# Patient Record
Sex: Female | Born: 1955
Health system: Southern US, Community
[De-identification: ages and names within clinical notes are randomized; demographics above are authoritative.]

## PROBLEM LIST (undated history)

## (undated) DIAGNOSIS — E785 Hyperlipidemia, unspecified: Secondary | ICD-10-CM

## (undated) DIAGNOSIS — G4730 Sleep apnea, unspecified: Secondary | ICD-10-CM

## (undated) DIAGNOSIS — I10 Essential (primary) hypertension: Secondary | ICD-10-CM

## (undated) DIAGNOSIS — M5126 Other intervertebral disc displacement, lumbar region: Secondary | ICD-10-CM

## (undated) DIAGNOSIS — Z923 Personal history of irradiation: Secondary | ICD-10-CM

## (undated) DIAGNOSIS — R0602 Shortness of breath: Secondary | ICD-10-CM

## (undated) DIAGNOSIS — C50919 Malignant neoplasm of unspecified site of unspecified female breast: Secondary | ICD-10-CM

## (undated) DIAGNOSIS — M8580 Other specified disorders of bone density and structure, unspecified site: Secondary | ICD-10-CM

## (undated) DIAGNOSIS — F329 Major depressive disorder, single episode, unspecified: Secondary | ICD-10-CM

## (undated) DIAGNOSIS — F32A Depression, unspecified: Secondary | ICD-10-CM

## (undated) DIAGNOSIS — H269 Unspecified cataract: Secondary | ICD-10-CM

## (undated) DIAGNOSIS — F419 Anxiety disorder, unspecified: Secondary | ICD-10-CM

## (undated) DIAGNOSIS — B9681 Helicobacter pylori [H. pylori] as the cause of diseases classified elsewhere: Secondary | ICD-10-CM

## (undated) DIAGNOSIS — K449 Diaphragmatic hernia without obstruction or gangrene: Secondary | ICD-10-CM

## (undated) DIAGNOSIS — M722 Plantar fascial fibromatosis: Secondary | ICD-10-CM

## (undated) DIAGNOSIS — K222 Esophageal obstruction: Secondary | ICD-10-CM

## (undated) DIAGNOSIS — M1990 Unspecified osteoarthritis, unspecified site: Secondary | ICD-10-CM

## (undated) DIAGNOSIS — Z803 Family history of malignant neoplasm of breast: Secondary | ICD-10-CM

## (undated) DIAGNOSIS — G5600 Carpal tunnel syndrome, unspecified upper limb: Secondary | ICD-10-CM

## (undated) DIAGNOSIS — K219 Gastro-esophageal reflux disease without esophagitis: Secondary | ICD-10-CM

## (undated) DIAGNOSIS — K2970 Gastritis, unspecified, without bleeding: Secondary | ICD-10-CM

## (undated) DIAGNOSIS — R52 Pain, unspecified: Secondary | ICD-10-CM

## (undated) DIAGNOSIS — M7501 Adhesive capsulitis of right shoulder: Secondary | ICD-10-CM

## (undated) DIAGNOSIS — Z1239 Encounter for other screening for malignant neoplasm of breast: Secondary | ICD-10-CM

## (undated) DIAGNOSIS — M47816 Spondylosis without myelopathy or radiculopathy, lumbar region: Secondary | ICD-10-CM

## (undated) DIAGNOSIS — M79641 Pain in right hand: Secondary | ICD-10-CM

## (undated) DIAGNOSIS — M25552 Pain in left hip: Secondary | ICD-10-CM

## (undated) DIAGNOSIS — M1611 Unilateral primary osteoarthritis, right hip: Secondary | ICD-10-CM

## (undated) HISTORY — PX: OTHER SURGICAL HISTORY: SHX169

## (undated) HISTORY — PX: TONSILLECTOMY: SUR1361

## (undated) HISTORY — PX: BREAST LUMPECTOMY: SHX2

## (undated) HISTORY — DX: Family history of malignant neoplasm of breast: Z80.3

## (undated) HISTORY — PX: ESOPHAGECTOMY: SUR457

## (undated) HISTORY — DX: Unspecified cataract: H26.9

## (undated) HISTORY — DX: Malignant neoplasm of unspecified site of unspecified female breast: C50.919

## (undated) HISTORY — DX: Diaphragmatic hernia without obstruction or gangrene: K44.9

## (undated) HISTORY — DX: Esophageal obstruction: K22.2

## (undated) HISTORY — DX: Other specified disorders of bone density and structure, unspecified site: M85.80

## (undated) HISTORY — PX: CHOLECYSTECTOMY: SHX55

## (undated) HISTORY — DX: Helicobacter pylori (H. pylori) as the cause of diseases classified elsewhere: B96.81

## (undated) HISTORY — DX: Carpal tunnel syndrome, unspecified upper limb: G56.00

## (undated) HISTORY — PX: CARPAL TUNNEL RELEASE: SHX101

## (undated) HISTORY — DX: Hyperlipidemia, unspecified: E78.5

## (undated) HISTORY — PX: ABDOMINAL HYSTERECTOMY: SHX81

## (undated) HISTORY — DX: Helicobacter pylori (H. pylori) as the cause of diseases classified elsewhere: K29.70

## (undated) HISTORY — PX: UPPER GASTROINTESTINAL ENDOSCOPY: SHX188

---

## 2001-01-10 ENCOUNTER — Other Ambulatory Visit: Admission: RE | Admit: 2001-01-10 | Discharge: 2001-01-10 | Payer: Self-pay | Admitting: Family Medicine

## 2003-04-12 ENCOUNTER — Ambulatory Visit (HOSPITAL_COMMUNITY): Admission: RE | Admit: 2003-04-12 | Discharge: 2003-04-12 | Payer: Self-pay | Admitting: *Deleted

## 2003-04-17 ENCOUNTER — Ambulatory Visit (HOSPITAL_COMMUNITY): Admission: RE | Admit: 2003-04-17 | Discharge: 2003-04-17 | Payer: Self-pay | Admitting: Cardiology

## 2004-12-31 ENCOUNTER — Emergency Department (HOSPITAL_COMMUNITY): Admission: EM | Admit: 2004-12-31 | Discharge: 2005-01-01 | Payer: Self-pay | Admitting: Emergency Medicine

## 2006-11-02 ENCOUNTER — Ambulatory Visit (HOSPITAL_COMMUNITY): Admission: RE | Admit: 2006-11-02 | Discharge: 2006-11-02 | Payer: Self-pay | Admitting: Family Medicine

## 2007-10-09 ENCOUNTER — Ambulatory Visit (HOSPITAL_COMMUNITY): Admission: RE | Admit: 2007-10-09 | Discharge: 2007-10-09 | Payer: Self-pay | Admitting: Family Medicine

## 2011-06-22 ENCOUNTER — Other Ambulatory Visit: Payer: Self-pay | Admitting: Otolaryngology

## 2011-06-25 ENCOUNTER — Encounter (HOSPITAL_COMMUNITY): Payer: Self-pay | Admitting: Pharmacy Technician

## 2011-06-29 ENCOUNTER — Other Ambulatory Visit: Payer: Self-pay

## 2011-06-29 ENCOUNTER — Encounter (HOSPITAL_COMMUNITY)
Admission: RE | Admit: 2011-06-29 | Discharge: 2011-06-29 | Disposition: A | Discharge status: Home / Self Care | Payer: BC Managed Care – PPO | Source: Ambulatory Visit | Attending: Specialist | Admitting: Specialist

## 2011-06-29 ENCOUNTER — Ambulatory Visit (HOSPITAL_COMMUNITY)
Admission: RE | Admit: 2011-06-29 | Discharge: 2011-06-29 | Disposition: A | Discharge status: Home / Self Care | Payer: BC Managed Care – PPO | Source: Ambulatory Visit | Attending: Specialist | Admitting: Specialist

## 2011-06-29 ENCOUNTER — Encounter (HOSPITAL_COMMUNITY): Payer: Self-pay

## 2011-06-29 DIAGNOSIS — Z01810 Encounter for preprocedural cardiovascular examination: Secondary | ICD-10-CM | POA: Insufficient documentation

## 2011-06-29 DIAGNOSIS — I10 Essential (primary) hypertension: Secondary | ICD-10-CM | POA: Insufficient documentation

## 2011-06-29 DIAGNOSIS — S43429A Sprain of unspecified rotator cuff capsule, initial encounter: Secondary | ICD-10-CM | POA: Insufficient documentation

## 2011-06-29 DIAGNOSIS — Z01812 Encounter for preprocedural laboratory examination: Secondary | ICD-10-CM | POA: Insufficient documentation

## 2011-06-29 DIAGNOSIS — Z01818 Encounter for other preprocedural examination: Secondary | ICD-10-CM | POA: Insufficient documentation

## 2011-06-29 DIAGNOSIS — X58XXXA Exposure to other specified factors, initial encounter: Secondary | ICD-10-CM | POA: Insufficient documentation

## 2011-06-29 HISTORY — DX: Shortness of breath: R06.02

## 2011-06-29 HISTORY — DX: Gastro-esophageal reflux disease without esophagitis: K21.9

## 2011-06-29 HISTORY — DX: Essential (primary) hypertension: I10

## 2011-06-29 HISTORY — DX: Sleep apnea, unspecified: G47.30

## 2011-06-29 HISTORY — DX: Unspecified osteoarthritis, unspecified site: M19.90

## 2011-06-29 HISTORY — DX: Plantar fascial fibromatosis: M72.2

## 2011-06-29 HISTORY — DX: Major depressive disorder, single episode, unspecified: F32.9

## 2011-06-29 HISTORY — DX: Other intervertebral disc displacement, lumbar region: M51.26

## 2011-06-29 HISTORY — DX: Depression, unspecified: F32.A

## 2011-06-29 LAB — CBC
HCT: 34.7 % — ABNORMAL LOW (ref 36.0–46.0)
Hemoglobin: 11.7 g/dL — ABNORMAL LOW (ref 12.0–15.0)
MCH: 30.7 pg (ref 26.0–34.0)
MCHC: 33.7 g/dL (ref 30.0–36.0)
MCV: 91.1 fL (ref 78.0–100.0)
Platelets: 208 10*3/uL (ref 150–400)
RBC: 3.81 MIL/uL — ABNORMAL LOW (ref 3.87–5.11)
RDW: 13 % (ref 11.5–15.5)
WBC: 7 10*3/uL (ref 4.0–10.5)

## 2011-06-29 LAB — SURGICAL PCR SCREEN
MRSA, PCR: NEGATIVE
Staphylococcus aureus: POSITIVE — AB

## 2011-06-29 LAB — BASIC METABOLIC PANEL
BUN: 13 mg/dL (ref 6–23)
CO2: 27 mEq/L (ref 19–32)
Calcium: 9.8 mg/dL (ref 8.4–10.5)
Chloride: 101 mEq/L (ref 96–112)
Creatinine, Ser: 0.99 mg/dL (ref 0.50–1.10)
GFR calc Af Amer: 74 mL/min — ABNORMAL LOW (ref >90)
GFR calc non Af Amer: 63 mL/min — ABNORMAL LOW (ref >90)
Glucose, Bld: 116 mg/dL — ABNORMAL HIGH (ref 70–99)
Potassium: 4.1 mEq/L (ref 3.5–5.1)
Sodium: 137 mEq/L (ref 135–145)

## 2011-06-29 NOTE — Pre-Procedure Instructions (Signed)
EKG AND CHEST XRAY WERE DONE TODAY PREOP AT [facility] PER ANESTHESIOLOGIST'S GUIDELINES. PT'S SLEEP STUDY REPORT FROM SLEEP CENTER AT [facility] 06/21/10--IS ON THIS CHART.

## 2011-06-29 NOTE — Patient Instructions (Signed)
[address] Sadaf MILIA WARTH  06/29/2011   Your procedure is scheduled on:  Friday  2/1  AT 1:00PM  Report to [facility] at 11:00 AM.  Call this number if you have problems the morning of surgery: [phone]   Remember:DO NOT TAKE ANY DIABETIC MEDS AM OF SURGERY   Do not eat food AFTER MIDNIGHT THE NIGHT BEFORE YOUR SURGERY.  May have clear liquids FROM MIDNIGHT UNTIL 7:00 AM THE DAY OF YOUR SURGERY.   BUT NOTHING TO DRINK AFTER 7:OO AM  OF SURGERY.  Clear liquids include soda, tea, black coffee, apple or grape juice, WATER.  Take these medicines the morning of surgery with A SIP OF WATER: TAKE YOUR OVER THE COUNTER ANTACID PILL THE AM OF SURGERY.   Do not wear jewelry, make-up or nail polish.  Do not wear lotions, powders, or perfumes.   Do not shave 48 hours prior to surgery.  Do not bring valuables to the hospital.  Contacts, dentures or bridgework may not be worn into surgery.  Leave suitcase in the car. After surgery it may be brought to your room.  For patients admitted to the hospital, checkout time is 11:00 AM the day of discharge.   Patients discharged the day of surgery will not be allowed to drive home.    Special Instructions: CHG Shower Use Special Wash: 1/2 bottle night before surgery and 1/2 bottle morning of surgery.   Please read over the following fact sheets that you were given: MRSA Information AND INCENTIVE SPIROMETRY INSTRUCTIONS.

## 2011-07-09 ENCOUNTER — Encounter (HOSPITAL_COMMUNITY): Payer: Self-pay | Admitting: Anesthesiology

## 2011-07-09 ENCOUNTER — Ambulatory Visit (HOSPITAL_COMMUNITY)
Admission: RE | Admit: 2011-07-09 | Discharge: 2011-07-09 | Disposition: A | Discharge status: Home / Self Care | Payer: BC Managed Care – PPO | Source: Ambulatory Visit | Attending: Specialist | Admitting: Specialist

## 2011-07-09 ENCOUNTER — Ambulatory Visit (HOSPITAL_COMMUNITY): Payer: BC Managed Care – PPO | Admitting: Anesthesiology

## 2011-07-09 ENCOUNTER — Encounter (HOSPITAL_COMMUNITY): Payer: Self-pay | Admitting: *Deleted

## 2011-07-09 ENCOUNTER — Encounter (HOSPITAL_COMMUNITY)
Admission: RE | Disposition: A | Discharge status: Home / Self Care | Payer: Self-pay | Source: Ambulatory Visit | Attending: Specialist

## 2011-07-09 DIAGNOSIS — I10 Essential (primary) hypertension: Secondary | ICD-10-CM | POA: Insufficient documentation

## 2011-07-09 DIAGNOSIS — M25519 Pain in unspecified shoulder: Secondary | ICD-10-CM | POA: Insufficient documentation

## 2011-07-09 DIAGNOSIS — K219 Gastro-esophageal reflux disease without esophagitis: Secondary | ICD-10-CM | POA: Insufficient documentation

## 2011-07-09 DIAGNOSIS — M24119 Other articular cartilage disorders, unspecified shoulder: Secondary | ICD-10-CM | POA: Insufficient documentation

## 2011-07-09 DIAGNOSIS — M7500 Adhesive capsulitis of unspecified shoulder: Secondary | ICD-10-CM | POA: Insufficient documentation

## 2011-07-09 DIAGNOSIS — G4730 Sleep apnea, unspecified: Secondary | ICD-10-CM | POA: Insufficient documentation

## 2011-07-09 DIAGNOSIS — S43429A Sprain of unspecified rotator cuff capsule, initial encounter: Secondary | ICD-10-CM | POA: Insufficient documentation

## 2011-07-09 DIAGNOSIS — E119 Type 2 diabetes mellitus without complications: Secondary | ICD-10-CM | POA: Insufficient documentation

## 2011-07-09 DIAGNOSIS — Z79899 Other long term (current) drug therapy: Secondary | ICD-10-CM | POA: Insufficient documentation

## 2011-07-09 DIAGNOSIS — X58XXXA Exposure to other specified factors, initial encounter: Secondary | ICD-10-CM | POA: Insufficient documentation

## 2011-07-09 DIAGNOSIS — M7540 Impingement syndrome of unspecified shoulder: Secondary | ICD-10-CM

## 2011-07-09 DIAGNOSIS — M25819 Other specified joint disorders, unspecified shoulder: Secondary | ICD-10-CM | POA: Insufficient documentation

## 2011-07-09 SURGERY — SHOULDER ARTHROSCOPY WITH ROTATOR CUFF REPAIR AND SUBACROMIAL DECOMPRESSION
Anesthesia: General | Site: Shoulder | Laterality: Right | Wound class: Clean

## 2011-07-09 MED ORDER — SODIUM CHLORIDE 0.9 % IR SOLN
Status: DC | PRN
  Administered 2011-07-09: 13:00:00

## 2011-07-09 MED ORDER — FENTANYL CITRATE 0.05 MG/ML IJ SOLN
INTRAMUSCULAR | Status: DC | PRN
  Administered 2011-07-09 (×2): 100 ug via INTRAVENOUS

## 2011-07-09 MED ORDER — METHOCARBAMOL 100 MG/ML IJ SOLN
500.0000 mg | Freq: Four times a day (QID) | INTRAVENOUS | Status: DC | PRN
  Administered 2011-07-09: 500 mg via INTRAVENOUS
  Filled 2011-07-09: qty 5

## 2011-07-09 MED ORDER — NEOSTIGMINE METHYLSULFATE 1 MG/ML IJ SOLN
INTRAMUSCULAR | Status: DC | PRN
  Administered 2011-07-09: 3 mg via INTRAVENOUS

## 2011-07-09 MED ORDER — HYDROMORPHONE HCL PF 1 MG/ML IJ SOLN
0.2500 mg | INTRAMUSCULAR | Status: DC | PRN
  Administered 2011-07-09: 0.5 mg via INTRAVENOUS

## 2011-07-09 MED ORDER — LACTATED RINGERS IR SOLN
Status: DC | PRN
  Administered 2011-07-09 (×2): 3000 mL

## 2011-07-09 MED ORDER — GLYCOPYRROLATE 0.2 MG/ML IJ SOLN
INTRAMUSCULAR | Status: DC | PRN
  Administered 2011-07-09: .4 mg via INTRAVENOUS

## 2011-07-09 MED ORDER — LACTATED RINGERS IV SOLN: INTRAVENOUS | Status: DC

## 2011-07-09 MED ORDER — MEPERIDINE HCL 25 MG/ML IJ SOLN: 6.2500 mg | INTRAMUSCULAR | Status: DC | PRN

## 2011-07-09 MED ORDER — ROCURONIUM BROMIDE 100 MG/10ML IV SOLN
INTRAVENOUS | Status: DC | PRN
  Administered 2011-07-09: 30 mg via INTRAVENOUS

## 2011-07-09 MED ORDER — ACETAMINOPHEN 10 MG/ML IV SOLN
INTRAVENOUS | Status: DC | PRN
  Administered 2011-07-09: 1000 mg via INTRAVENOUS

## 2011-07-09 MED ORDER — PROMETHAZINE HCL 25 MG/ML IJ SOLN: 6.2500 mg | INTRAMUSCULAR | Status: DC | PRN

## 2011-07-09 MED ORDER — SUCCINYLCHOLINE CHLORIDE 20 MG/ML IJ SOLN
INTRAMUSCULAR | Status: DC | PRN
  Administered 2011-07-09: 100 mg via INTRAVENOUS

## 2011-07-09 MED ORDER — METHOCARBAMOL 500 MG PO TABS: 500.0000 mg | ORAL_TABLET | Freq: Three times a day (TID) | ORAL | Status: AC | PRN

## 2011-07-09 MED ORDER — MIDAZOLAM HCL 5 MG/5ML IJ SOLN
INTRAMUSCULAR | Status: DC | PRN
  Administered 2011-07-09: 2 mg via INTRAVENOUS

## 2011-07-09 MED ORDER — BUPIVACAINE-EPINEPHRINE 0.5% -1:200000 IJ SOLN
INTRAMUSCULAR | Status: DC | PRN
  Administered 2011-07-09: 25 mL

## 2011-07-09 MED ORDER — EPINEPHRINE HCL 1 MG/ML IJ SOLN
INTRAMUSCULAR | Status: DC | PRN
  Administered 2011-07-09: 1 mg via INTRAVENOUS

## 2011-07-09 MED ORDER — ONDANSETRON HCL 4 MG/2ML IJ SOLN
INTRAMUSCULAR | Status: DC | PRN
  Administered 2011-07-09: 4 mg via INTRAVENOUS

## 2011-07-09 MED ORDER — LACTATED RINGERS IV SOLN
INTRAVENOUS | Status: DC | PRN
  Administered 2011-07-09: 13:00:00 via INTRAVENOUS

## 2011-07-09 MED ORDER — PROPOFOL 10 MG/ML IV EMUL
INTRAVENOUS | Status: DC | PRN
  Administered 2011-07-09: 150 mg via INTRAVENOUS

## 2011-07-09 MED ORDER — CEFAZOLIN SODIUM-DEXTROSE 2-3 GM-% IV SOLR
2.0000 g | INTRAVENOUS | Status: AC
  Administered 2011-07-09: 2 g via INTRAVENOUS

## 2011-07-09 MED ORDER — CHLORHEXIDINE GLUCONATE 4 % EX LIQD: 60.0000 mL | Freq: Once | CUTANEOUS | Status: DC

## 2011-07-09 MED ORDER — OXYCODONE-ACETAMINOPHEN 5-325 MG PO TABS
1.0000 | ORAL_TABLET | ORAL | Status: DC | PRN
  Administered 2011-07-09: 1 via ORAL

## 2011-07-09 MED ORDER — LIDOCAINE HCL (CARDIAC) 20 MG/ML IV SOLN
INTRAVENOUS | Status: DC | PRN
  Administered 2011-07-09: 100 mg via INTRAVENOUS

## 2011-07-09 MED ORDER — OXYCODONE-ACETAMINOPHEN 10-325 MG PO TABS: 1.0000 | ORAL_TABLET | ORAL | Status: DC | PRN

## 2011-07-09 SURGICAL SUPPLY — 67 items
ANCHOR ROTATOR CUFF #2 (Anchor) ×6 IMPLANT
BAG ZIPLOCK 12X15 (MISCELLANEOUS) ×3 IMPLANT
BENZOIN TINCTURE PRP APPL 2/3 (GAUZE/BANDAGES/DRESSINGS) ×3 IMPLANT
BLADE CUTTER GATOR 3.5 (BLADE) ×3 IMPLANT
BLADE OSCILLATING/SAGITTAL (BLADE)
BLADE SURG SZ11 CARB STEEL (BLADE) IMPLANT
BLADE SW THK.38XMED LNG THN (BLADE) IMPLANT
BNDG COHESIVE 4X5 WHT NS (GAUZE/BANDAGES/DRESSINGS) ×3 IMPLANT
BUR OVAL 4.0 (BURR) ×3 IMPLANT
BUR OVAL CARBIDE 4.0 (BURR) ×3 IMPLANT
CANNULA ACUFO 5X76 (CANNULA) ×3 IMPLANT
CHLORAPREP W/TINT 26ML (MISCELLANEOUS) IMPLANT
CLEANER TIP ELECTROSURG 2X2 (MISCELLANEOUS) ×3 IMPLANT
CLOTH BEACON ORANGE TIMEOUT ST (SAFETY) ×3 IMPLANT
DECANTER SPIKE VIAL GLASS SM (MISCELLANEOUS) ×3 IMPLANT
DRAPE ORTHO SPLIT 77X108 STRL (DRAPES) ×1
DRAPE POUCH INSTRU U-SHP 10X18 (DRAPES) ×3 IMPLANT
DRAPE SURG ORHT 6 SPLT 77X108 (DRAPES) ×2 IMPLANT
DRAPE U-SHAPE 47X51 STRL (DRAPES) ×3 IMPLANT
DRSG EMULSION OIL 3X3 NADH (GAUZE/BANDAGES/DRESSINGS) ×3 IMPLANT
DRSG PAD ABDOMINAL 8X10 ST (GAUZE/BANDAGES/DRESSINGS) ×3 IMPLANT
DURAPREP 26ML APPLICATOR (WOUND CARE) ×3 IMPLANT
ELECT NEEDLE TIP 2.8 STRL (NEEDLE) ×3 IMPLANT
ELECT REM PT RETURN 9FT ADLT (ELECTROSURGICAL) ×3
ELECTRODE REM PT RTRN 9FT ADLT (ELECTROSURGICAL) ×2 IMPLANT
GLOVE BIO SURGEON STRL SZ 6.5 (GLOVE) ×3 IMPLANT
GLOVE BIOGEL PI IND STRL 6.5 (GLOVE) ×2 IMPLANT
GLOVE BIOGEL PI IND STRL 7.0 (GLOVE) ×2 IMPLANT
GLOVE BIOGEL PI IND STRL 8 (GLOVE) ×2 IMPLANT
GLOVE BIOGEL PI INDICATOR 6.5 (GLOVE) ×1
GLOVE BIOGEL PI INDICATOR 7.0 (GLOVE) ×1
GLOVE BIOGEL PI INDICATOR 8 (GLOVE) ×1
GLOVE ECLIPSE 6.5 STRL STRAW (GLOVE) ×3 IMPLANT
GLOVE SURG SS PI 7.0 STRL IVOR (GLOVE) ×3 IMPLANT
GLOVE SURG SS PI 8.0 STRL IVOR (GLOVE) ×6 IMPLANT
GOWN PREVENTION PLUS LG XLONG (DISPOSABLE) ×3 IMPLANT
GOWN PREVENTION PLUS XLARGE (GOWN DISPOSABLE) ×6 IMPLANT
GOWN STRL REIN XL XLG (GOWN DISPOSABLE) ×3 IMPLANT
KIT BASIN OR (CUSTOM PROCEDURE TRAY) ×3 IMPLANT
MANIFOLD NEPTUNE II (INSTRUMENTS) ×6 IMPLANT
NEEDLE MA TROC 1/2 (NEEDLE) ×3 IMPLANT
NEEDLE MA TROC 1/2 CIR (NEEDLE) IMPLANT
NEEDLE SPNL 18GX3.5 QUINCKE PK (NEEDLE) ×3 IMPLANT
PACK SHOULDER CUSTOM OPM052 (CUSTOM PROCEDURE TRAY) ×3 IMPLANT
POSITIONER SURGICAL ARM (MISCELLANEOUS) ×3 IMPLANT
PUSHLOCK PEEK 4.5X24 (Orthopedic Implant) ×6 IMPLANT
SET ARTHROSCOPY TUBING (MISCELLANEOUS) ×1
SET ARTHROSCOPY TUBING LN (MISCELLANEOUS) ×2 IMPLANT
SLING ARM IMMOBILIZER LRG (SOFTGOODS) IMPLANT
SLING ARM IMMOBILIZER MED (SOFTGOODS) IMPLANT
SLING ULTRA II S (ORTHOPEDIC SUPPLIES) IMPLANT
SPONGE GAUZE 4X4 12PLY (GAUZE/BANDAGES/DRESSINGS) ×3 IMPLANT
SPONGE LAP 4X18 X RAY DECT (DISPOSABLE) ×3 IMPLANT
STRAP CHIN BCCS-OSI (MISCELLANEOUS) ×3 IMPLANT
STRIP CLOSURE SKIN 1/2X4 (GAUZE/BANDAGES/DRESSINGS) ×3 IMPLANT
SUT BONE WAX W31G (SUTURE) ×3 IMPLANT
SUT ETHIBOND 0 (SUTURE) ×6 IMPLANT
SUT ETHIBOND 2 OS 4 DA (SUTURE) ×6 IMPLANT
SUT ETHILON 4 0 PS 2 18 (SUTURE) IMPLANT
SUT PROLENE 3 0 PS 2 (SUTURE) ×3 IMPLANT
SUT VIC AB 1-0 CT2 27 (SUTURE) IMPLANT
SUT VIC AB 2-0 CT2 27 (SUTURE) IMPLANT
SUT VICRYL 0 UR6 27IN ABS (SUTURE) IMPLANT
SUT VICRYL 0-0 OS 2 NEEDLE (SUTURE) ×3 IMPLANT
TAPE CLOTH 4X10 WHT NS (GAUZE/BANDAGES/DRESSINGS) ×3 IMPLANT
TUBING CONNECTING 10 (TUBING) ×3 IMPLANT
WAND 90 DEG TURBOVAC W/CORD (SURGICAL WAND) ×3 IMPLANT

## 2011-07-09 NOTE — Anesthesia Postprocedure Evaluation (Signed)
  Anesthesia Post-op Note  Patient: Erica Giles  Procedure(s) Performed:  SHOULDER ARTHROSCOPY WITH ROTATOR CUFF REPAIR AND SUBACROMIAL DECOMPRESSION; EXAM UNDER ANESTHESIA WITH MANIPULATION OF SHOULDER  Patient Location: PACU  Anesthesia Type: General  Level of Consciousness: awake and alert   Airway and Oxygen Therapy: Patient Spontanous Breathing  Post-op Pain: mild  Post-op Assessment: Post-op Vital signs reviewed, Patient's Cardiovascular Status Stable, Respiratory Function Stable, Patent Airway and No signs of Nausea or vomiting  Post-op Vital Signs: stable  Complications: No apparent anesthesia complications

## 2011-07-09 NOTE — Brief Op Note (Signed)
07/09/2011  2:07 PM  PATIENT:  Erica Giles  56 y.o. female  PRE-OPERATIVE DIAGNOSIS:  rotator cuff tear right  POST-OPERATIVE DIAGNOSIS:  right rotator cuff tear  PROCEDURE:  Procedure(s): SHOULDER ARTHROSCOPY WITH ROTATOR CUFF REPAIR AND SUBACROMIAL DECOMPRESSION EXAM UNDER ANESTHESIA WITH MANIPULATION OF SHOULDER  SURGEON:  Surgeon(s): Javier Docker, MD  PHYSICIAN ASSISTANT:   ASSISTANTS: Strader   ANESTHESIA:   general  EBL:     BLOOD ADMINISTERED:none  DRAINS: none   LOCAL MEDICATIONS USED:  MARCAINE 20CC  SPECIMEN:  No Specimen  COUNTS:  YES  TOURNIQUET:  * No tourniquets in log *  DICTATION: .Other Dictation: Dictation Number [id number]  PLAN OF CARE: Discharge to home after PACU  PATIENT DISPOSITION:  PACU - hemodynamically stable.   Delay start of Pharmacological VTE agent (>24hrs) due to surgical blood loss or risk of bleeding:  {YES/NO/NOT APPLICABLE:20182

## 2011-07-09 NOTE — Anesthesia Preprocedure Evaluation (Signed)
Anesthesia Evaluation  Patient identified by MRN, date of birth, ID band Patient awake    Reviewed: Allergy & Precautions, H&P , NPO status , Patient's Chart, lab work & pertinent test results  Airway Mallampati: II TM Distance: >3 FB Neck ROM: Full    Dental No notable dental hx.    Pulmonary neg pulmonary ROS, sleep apnea and Continuous Positive Airway Pressure Ventilation , former smoker clear to auscultation  Pulmonary exam normal       Cardiovascular hypertension, Pt. on medications neg cardio ROS Regular Normal    Neuro/Psych Negative Neurological ROS  Negative Psych ROS   GI/Hepatic negative GI ROS, Neg liver ROS,   Endo/Other  Negative Endocrine ROSDiabetes mellitus-, Oral Hypoglycemic Agents  Renal/GU negative Renal ROS  Genitourinary negative   Musculoskeletal negative musculoskeletal ROS (+)   Abdominal   Peds negative pediatric ROS (+)  Hematology negative hematology ROS (+)   Anesthesia Other Findings   Reproductive/Obstetrics negative OB ROS                           Anesthesia Physical Anesthesia Plan  ASA: II  Anesthesia Plan: General   Post-op Pain Management:    Induction: Intravenous  Airway Management Planned: Oral ETT  Additional Equipment:   Intra-op Plan:   Post-operative Plan: Extubation in OR  Informed Consent: I have reviewed the patients History and Physical, chart, labs and discussed the procedure including the risks, benefits and alternatives for the proposed anesthesia with the patient or authorized representative who has indicated his/her understanding and acceptance.   Dental advisory given  Plan Discussed with: CRNA  Anesthesia Plan Comments:         Anesthesia Quick Evaluation

## 2011-07-09 NOTE — H&P (Signed)
. Erica Giles is an 56 y.o. female.   Chief Complaint: right shoulder HPI:  Patient notes persistent  right shoulder pain.  MRI findings show impingement syndrome.  Patient has failed conservative treatment.  It is felt at this time they would benefit from surgical intervention.  Risks and benefits of surgery discussed with patient and they wish to proceed. Past Medical History  Diagnosis Date  . Hypertension   . Diabetes mellitus     ORAL MED  . GERD (gastroesophageal reflux disease)   . Shortness of breath     WITH EXERTION-PT RELATES TO HER WEIGHT  . Sleep apnea     MODERATE- PER SLEEP STUDY 06/2010--PT USES CPAP-SETTING IS 11  . Arthritis   . Plantar fasciitis     BILATERAL  . HNP (herniated nucleus pulposus), lumbar     PT TRYING TO AVOID LUMBAR SURGERY--STATES PAIN IN HER BACK AND SOMETIMES DOWN ONE OR THE OTHER LEG  . Depression     Past Surgical History  Procedure Date  . Abdominal hysterectomy   . Cholecystectomy   . Tonsillectomy   . Carpal tunnel release     BILATERAL  . Trigger finger repair   . Cyst removed     LEFT THUMB AND RT MIDDLE FINGER    History reviewed. No pertinent family history. Social History:  reports that she has quit smoking. She has never used smokeless tobacco. She reports that she does not drink alcohol or use illicit drugs.  Allergies:  Allergies  Allergen Reactions  . Hydrocodone Itching    PT WOULD TAKE WITH BENADRYL  . Percocet (Oxycodone-Acetaminophen) Itching    PT CAN TAKE WITH BENADRYL    Medications Prior to Admission  Medication Dose Route Frequency Provider Last Rate Last Dose  . ceFAZolin (ANCEF) IVPB 2 g/50 mL premix  2 g Intravenous 60 min Pre-Op Liam Graham, PA      . chlorhexidine (HIBICLENS) 4 % liquid 4 application  60 mL Topical Once Liam Graham, PA      . lactated ringers infusion   Intravenous Continuous Liam Graham, PA       No current outpatient prescriptions on file as of 07/09/2011.      Results for orders placed during the hospital encounter of 07/09/11 (from the past 48 hour(s))  GLUCOSE, CAPILLARY     Status: Abnormal   Collection Time   07/09/11 11:21 AM      Component Value Range Comment   Glucose-Capillary 108 (*) 70 - 99 (mg/dL)    Comment 1 Documented in Chart      No results found.  Review of Systems: The patient denies any fever, chills, night sweats, or bleeding tendencies. CNS: No blurred double vision, seizure, headache, paralysis. RESPIRATORY: No shortness of breath, productive cough, or hemoptysis. CARDIOVASCULAR: No chest pain, angina, or orthopnea, GU: No dysuria, hematuria, or discharge. MUSCULOSKELETAL: Pertinent per HPI.  Blood pressure 136/68, pulse 73, temperature 98.3 F (36.8 C), temperature source Oral, resp. rate 18, SpO2 96.00%.  GENERAL: Patient is a 56 y.o. female, well-nourished, well-developed, no acute distress. Alert, oriented, cooperative. HENT:  Normocephalic, atraumatic. Pupils round and reactive. EOMs intact. NECK:  Supple, no bruits. CHEST:  Clear to anterior and posterior chest walls. No rhonchi, rales, wheezes. HEART:  Regular, rate and rhythm.  No murmurs.  S1 and S2 noted. ABDOMEN:  Soft, nontender, bowel sounds present. RECTAL/BREAST/GENITALIA:  Not done, not pertinent to present illness. EXTREMITIES:  Loss of ROM, positive impingement ,  negative crossover, TTP anterior shoulder  Assessment/Plan Pt will undergo right shoulder arthroscopy and debridement with possible open repair of rotator cuff tendon.  Avantae Bither R. 07/09/2011, 11:43 AM

## 2011-07-09 NOTE — Transfer of Care (Signed)
Immediate Anesthesia Transfer of Care Note  Patient: Erica Giles  Procedure(s) Performed:  SHOULDER ARTHROSCOPY WITH ROTATOR CUFF REPAIR AND SUBACROMIAL DECOMPRESSION; EXAM UNDER ANESTHESIA WITH MANIPULATION OF SHOULDER  Patient Location: PACU  Anesthesia Type: General  Level of Consciousness: awake, alert  and oriented  Airway & Oxygen Therapy: Patient Spontanous Breathing and Patient connected to face mask oxygen  Post-op Assessment: Report given to PACU RN and Post -op Vital signs reviewed and stable  Post vital signs: Reviewed and stable  Complications: No apparent anesthesia complications

## 2011-07-09 NOTE — Preoperative (Signed)
Beta Blockers   Reason not to administer Beta Blockers:Not Applicable 

## 2011-07-10 NOTE — Op Note (Signed)
NAME:  Erica Giles, Erica Giles                  ACCOUNT NO.:  [id number]  MEDICAL RECORD NO.:  [id number]  LOCATION:  [facility]                         FACILITY:  [facility]  PHYSICIAN:  Jene Every, M.D.    DATE OF BIRTH:  22-Mar-1956  DATE OF PROCEDURE: DATE OF DISCHARGE:  07/09/2011                              OPERATIVE REPORT   PREOPERATIVE DIAGNOSES:  Capsulitis, impingement syndrome, labral tear, rotator cuff tear of the right shoulder.  POSTOPERATIVE DIAGNOSES:  Capsulitis, impingement syndrome, labral tear, rotator cuff tear of the right shoulder.  PROCEDURES PERFORMED: 1. Examination and manipulation under anesthesia. 2. Right shoulder arthroscopy with debridement of anterior superior     labrum. 3. Repair of rotator cuff. 4. Subacromial decompression, acromioplasty and bursectomy.  ANESTHESIA:  General.  ASSISTANT:  Roma Schanz, P.A.  HISTORY:  This is a 56 year old with refractory shoulder pain, locking, tension pain, decreased range of motion despite rest, activity modification, therapy exercises.  MRI indicated labral tearing, partial tear in the rotator cuff, impingement.  Indicated for exam and manipulation under anesthesia, labral debridement, evaluation of rotator cuff, subacromial decompression, possible open rotator cuff repair. Risk and benefits were discussed including bleeding, infection, damage to vascular structures, no change in symptoms, worsening symptoms, need for repeat debridement, DVT, PE, anesthetic complications etc.  TECHNIQUE:  With the patient in beach chair position, after induction of adequate general anesthesia and 2 g Kefzol, the right shoulder and upper extremity was prepped, draped in the usual sterile fashion.  This was after we examined her under anesthesia and had full range of motion. Surgical marker was utilized to delineate the acromion, AC joint, coracoid.  Standard posterolateral and anterolateral portals were utilized with incision  through skin only.  With the arm in 70-30 position with gentle traction, camera was inserted to the glenohumeral joint penetrated atraumatically.  Irrigant was utilized to insufflate the joint.  There was noted tearing of the anterosuperior labrum, fraying from the undersurface of the supraspinatus part of the rotator cuff.  I therefore fashioned an anterior portal after localization with an 18-gauge needle, just beneath the biceps tendon, fashioning with a #11 blade.  I inserted the cannula into the joint, inserted a 3-5 gator shaver and debrided the labrum anterior superior to a stable base.  This was probed.  Next we debrided the undersurface of the rotator cuff, this appeared to be a minor tear.  Biceps was unremarkable as was the subscapularis.  Next lavaged the joint cartilage, it looked fine.  We redirected the camera in the subacromial space.  We translated the anterior portal to the anterolateral portal with the incision through the skin only triangulating the subacromial space.  Noted was exuberant hypertrophic bursa.  This was excised with a full bursectomy anteriorly, posteriorly and laterally.  No evidence of rotator cuff tear from the bursal side.  There was significant impingement and impingement lesion on the anterior lateral aspect of the acromion.  The CA ligament was released and morselized with an Arthur wand, performed acromioplasty of the anterolateral aspect of the acromion, a very small portion of the bone was removed.  Full inspection and probing again revealed no anterior rotator cuff tear.  She had adequate increased space following the decompression and the bursectomy.  The joint was copiously lavaged. All instrumentation was removed.  Portals were closed with 4-0 nylon simple sutures.  1/4% Marcaine with epinephrine was infiltrated in the joint.  The wound was dressed sterilely.  The patient was awoken without difficulty, placed in a sling, and transported to  the recovery room in satisfactory condition.  The patient tolerated the procedure well.  No complications.  Assistant was Roma Schanz for retraction and help with manipulation and instrumentation as well as controlling the outflow of the irrigant.     Jene Every, M.D.     Cordelia Pen  D:  07/09/2011  T:  07/10/2011  Job:  [id number]

## 2011-07-21 NOTE — Op Note (Signed)
NAME:  Erica Giles, Erica Giles                  ACCOUNT NO.:  [id number]  MEDICAL RECORD NO.:  [id number]  LOCATION:                                 FACILITY:  PHYSICIAN:  Jene Every, M.D.    DATE OF BIRTH:  [birth year]  DATE OF PROCEDURE:  07/09/2011 DATE OF DISCHARGE:                              OPERATIVE REPORT   ADDENDUM  PROCEDURE PERFORMED:  No.3 is debridement of partial tear of the rotator cuff.     Jene Every, M.D.     Cordelia Pen  D:  07/20/2011  T:  07/20/2011  Job:  [id number]

## 2012-03-16 ENCOUNTER — Other Ambulatory Visit (HOSPITAL_COMMUNITY): Payer: Self-pay | Admitting: Orthopedic Surgery

## 2012-03-16 DIAGNOSIS — S83209A Unspecified tear of unspecified meniscus, current injury, unspecified knee, initial encounter: Secondary | ICD-10-CM

## 2012-03-21 ENCOUNTER — Ambulatory Visit (HOSPITAL_COMMUNITY)
Admission: RE | Admit: 2012-03-21 | Discharge: 2012-03-21 | Disposition: A | Discharge status: Home / Self Care | Payer: BC Managed Care – PPO | Source: Ambulatory Visit | Attending: Orthopedic Surgery | Admitting: Orthopedic Surgery

## 2012-03-21 DIAGNOSIS — IMO0002 Reserved for concepts with insufficient information to code with codable children: Secondary | ICD-10-CM | POA: Insufficient documentation

## 2012-03-21 DIAGNOSIS — M25569 Pain in unspecified knee: Secondary | ICD-10-CM | POA: Insufficient documentation

## 2012-03-21 DIAGNOSIS — S83209A Unspecified tear of unspecified meniscus, current injury, unspecified knee, initial encounter: Secondary | ICD-10-CM

## 2012-03-21 DIAGNOSIS — M1710 Unilateral primary osteoarthritis, unspecified knee: Secondary | ICD-10-CM | POA: Insufficient documentation

## 2013-10-12 ENCOUNTER — Encounter: Payer: Self-pay | Admitting: Internal Medicine

## 2013-12-05 ENCOUNTER — Encounter: Payer: Self-pay | Admitting: Internal Medicine

## 2013-12-11 ENCOUNTER — Encounter: Payer: Self-pay | Admitting: Internal Medicine

## 2013-12-11 ENCOUNTER — Ambulatory Visit (INDEPENDENT_AMBULATORY_CARE_PROVIDER_SITE_OTHER): Payer: BC Managed Care – PPO | Admitting: Internal Medicine

## 2013-12-11 VITALS — BP 118/66 | HR 64 | Ht 65.0 in | Wt 207.0 lb

## 2013-12-11 DIAGNOSIS — R1310 Dysphagia, unspecified: Secondary | ICD-10-CM

## 2013-12-11 DIAGNOSIS — K219 Gastro-esophageal reflux disease without esophagitis: Secondary | ICD-10-CM | POA: Insufficient documentation

## 2013-12-11 DIAGNOSIS — Z1211 Encounter for screening for malignant neoplasm of colon: Secondary | ICD-10-CM

## 2013-12-11 DIAGNOSIS — K649 Unspecified hemorrhoids: Secondary | ICD-10-CM | POA: Insufficient documentation

## 2013-12-11 DIAGNOSIS — E119 Type 2 diabetes mellitus without complications: Secondary | ICD-10-CM | POA: Insufficient documentation

## 2013-12-11 DIAGNOSIS — E785 Hyperlipidemia, unspecified: Secondary | ICD-10-CM | POA: Insufficient documentation

## 2013-12-11 DIAGNOSIS — Z9049 Acquired absence of other specified parts of digestive tract: Secondary | ICD-10-CM | POA: Insufficient documentation

## 2013-12-11 DIAGNOSIS — IMO0002 Reserved for concepts with insufficient information to code with codable children: Secondary | ICD-10-CM | POA: Insufficient documentation

## 2013-12-11 MED ORDER — PANTOPRAZOLE SODIUM 40 MG PO TBEC: 40.0000 mg | DELAYED_RELEASE_TABLET | Freq: Every day | ORAL | Status: DC

## 2013-12-11 NOTE — Patient Instructions (Signed)
You have been scheduled for an endoscopy. Please follow written instructions given to you at your visit today. If you use inhalers (even only as needed), please bring them with you on the day of your procedure. Your physician has requested that you go to www.startemmi.com and enter the access code given to you at your visit today. This web site gives a general overview about your procedure. However, you should still follow specific instructions given to you by our office regarding your preparation for the procedure.  We have sent the following medications to your pharmacy for you to pick up at your convenience: pantoprazole 40 mg daily

## 2013-12-11 NOTE — Progress Notes (Addendum)
Patient ID: Erica Giles, female   DOB: 1955/08/11, 58 y.o.   MRN: [id number] HPI: Erica Giles is a 58 year old female with a past medical history of GERD, internal and external hemorrhoids, gallbladder disease status post cholecystectomy in 1986, diabetes, hyperlipidemia, degenerative disc disease who is seen in consultation at the request of Dr. Rory Percy to evaluate solid food dysphagia. She is here alone today. She reports over the last several months to a year she has intermittent trouble with dysphagia. This can happen with both solids and liquids but is more significant for solid food. There are times when she is eating more the food stops and has to be regurgitated in order for her to continue eating. She reports a pressure sensation in her mid and upper chest. This causes her to stop eating until it "clears". She does have a history of indigestion and heartburn which she uses over-the-counter ranitidine. This seemed to help her heartburn symptoms. She denies nausea or vomiting. No early satiety. She has lost 13 pounds which she reports is intentional due to diet modification and exercise stressed by her primary care office in relation to her diabetes. She reports her bowel movements have been regular without blood or melena. She denies significant diarrhea or constipation. She does report a history of anal fissure as well as internal and external hemorrhoids, but denies a problem with these at present. She has had a colonoscopy approximately 6 years ago in it and perform a Dr. Lizbeth Bark.  She recalls being told about her hemorrhoids but not polyps. She denies hepatobiliary complaints today. No family history of GI tract malignancy.  Past Medical History  Diagnosis Date  . Hypertension   . Diabetes mellitus     ORAL MED  . GERD (gastroesophageal reflux disease)   . Shortness of breath     WITH EXERTION-PT RELATES TO HER WEIGHT  . Sleep apnea     MODERATE- PER SLEEP STUDY 06/2010--PT USES  CPAP-SETTING IS 11  . Arthritis   . Plantar fasciitis     BILATERAL  . HNP (herniated nucleus pulposus), lumbar     PT TRYING TO AVOID LUMBAR SURGERY--STATES PAIN IN HER BACK AND SOMETIMES DOWN ONE OR THE OTHER LEG  . Depression     Past Surgical History  Procedure Laterality Date  . Abdominal hysterectomy    . Cholecystectomy    . Tonsillectomy    . Carpal tunnel release      BILATERAL  . Trigger finger repair    . Cyst removed      LEFT THUMB AND RT MIDDLE FINGER    Outpatient Prescriptions Prior to Visit  Medication Sig Dispense Refill  . atorvastatin (LIPITOR) 10 MG tablet Take 10 mg by mouth at bedtime.      . citalopram (CELEXA) 20 MG tablet Take 20 mg by mouth at bedtime.      . diphenhydrAMINE (BENADRYL) 25 MG tablet Take 25 mg by mouth every 6 (six) hours as needed. Allergies       . ibuprofen (ADVIL,MOTRIN) 800 MG tablet Take 800 mg by mouth every 8 (eight) hours as needed. Pain       . lisinopril (PRINIVIL,ZESTRIL) 20 MG tablet Take 20 mg by mouth at bedtime.      . metFORMIN (GLUCOPHAGE-XR) 500 MG 24 hr tablet Take 1,000 mg by mouth 2 (two) times daily.      Marland Kitchen OVER THE COUNTER MEDICATION ANTACID OTC 150 MG EVERY AM      . oxyCODONE-acetaminophen (  PERCOCET) 10-325 MG per tablet Take 1-2 tablets by mouth every 4 (four) hours as needed for pain. Pain  60 tablet  0   No facility-administered medications prior to visit.    Allergies  Allergen Reactions  . Hydrocodone Itching    PT WOULD TAKE WITH BENADRYL  . Percocet [Oxycodone-Acetaminophen] Itching    PT CAN TAKE WITH BENADRYL    Family History  Problem Relation Age of Onset  . Diabetes Mother   . Irritable bowel syndrome Mother   . Diabetes Father     History  Substance Use Topics  . Smoking status: Former Smoker -- 1.00 packs/day for 25 years  . Smokeless tobacco: Never Used     Comment: 03/2008  . Alcohol Use: No    ROS: As per history of present illness, otherwise negative  BP 118/66  Pulse  64  Ht 5\' 5"  (1.651 m)  Wt 207 lb (93.895 kg)  BMI 34.45 kg/m2 Constitutional: Well-developed and well-nourished. No distress. HEENT: Normocephalic and atraumatic. Oropharynx is clear and moist. No oropharyngeal exudate. Conjunctivae are normal.  No scleral icterus. Neck: Neck supple. Trachea midline. Cardiovascular: Normal rate, regular rhythm and intact distal pulses. No M/R/G Pulmonary/chest: Effort normal and breath sounds normal. No wheezing, rales or rhonchi. Abdominal: Soft, mild epigastric tenderness without rebound or guarding, nondistended. Bowel sounds active throughout. There are no masses palpable. No hepatosplenomegaly. Extremities: no clubbing, cyanosis, or edema Lymphadenopathy: No cervical adenopathy noted. Neurological: Alert and oriented to person place and time. Skin: Skin is warm and dry. No rashes noted. Psychiatric: Normal mood and affect. Behavior is normal.   ASSESSMENT/PLAN: 58 year old female with a past medical history of GERD, internal and external hemorrhoids, gallbladder disease status post cholecystectomy in 1986, diabetes, hyperlipidemia, degenerative disc disease who is seen in consultation at the request of Dr. Rory Percy to evaluate solid food dysphagia.   1.  GERD with solid food dysphagia -- we discussed the differential which includes esophagitis and or esophageal stricture/Schatzki's ring. This is felt less likely to be a mass lesion. It does somewhat she has heartburn which is incompletely controlled which is likely contributing to her symptoms. I have recommended upper endoscopy with possible dilation. We discussed the test including the risks and benefits and she is agreeable to proceed. I would like to start her on pantoprazole 40 mg daily for better acid control and allow time for healing of any esophagitis which might be present. Endoscopy is also been performed to exclude Barrett's esophagus given her history of long-standing heartburn and reflux  symptoms.  2.  CRC screening -- she has had previous colonoscopy and denies a history of colon polyps. She feels that this was done around age 2. We will request records for completeness. If her recollection is correct she will be due repeat colonoscopy for screening at age 9.  Addendum: Colonoscopy received dated 03/17/2005 --Exam to the cecum, no polyps or diverticulosis. Internal and external hemorrhoids. --Based on this result I recommend repeat colonoscopy in October 2016 for screening

## 2014-01-21 ENCOUNTER — Ambulatory Visit (AMBULATORY_SURGERY_CENTER): Payer: BC Managed Care – PPO | Admitting: Internal Medicine

## 2014-01-21 ENCOUNTER — Encounter: Payer: Self-pay | Admitting: Internal Medicine

## 2014-01-21 VITALS — BP 115/53 | HR 58 | Temp 98.3°F | Resp 12 | Ht 65.0 in | Wt 207.0 lb

## 2014-01-21 DIAGNOSIS — Q393 Congenital stenosis and stricture of esophagus: Secondary | ICD-10-CM

## 2014-01-21 DIAGNOSIS — Q391 Atresia of esophagus with tracheo-esophageal fistula: Secondary | ICD-10-CM

## 2014-01-21 DIAGNOSIS — K2970 Gastritis, unspecified, without bleeding: Secondary | ICD-10-CM

## 2014-01-21 DIAGNOSIS — R1310 Dysphagia, unspecified: Secondary | ICD-10-CM

## 2014-01-21 DIAGNOSIS — K219 Gastro-esophageal reflux disease without esophagitis: Secondary | ICD-10-CM

## 2014-01-21 DIAGNOSIS — A048 Other specified bacterial intestinal infections: Secondary | ICD-10-CM

## 2014-01-21 DIAGNOSIS — K2990 Gastroduodenitis, unspecified, without bleeding: Secondary | ICD-10-CM

## 2014-01-21 DIAGNOSIS — K222 Esophageal obstruction: Secondary | ICD-10-CM

## 2014-01-21 MED ORDER — SODIUM CHLORIDE 0.9 % IV SOLN: 500.0000 mL | INTRAVENOUS | Status: DC

## 2014-01-21 NOTE — Patient Instructions (Signed)
YOU HAD AN ENDOSCOPIC PROCEDURE TODAY AT [facility]: Refer to the procedure report that was given to you for any specific questions about what was found during the examination.  If the procedure report does not answer your questions, please call your gastroenterologist to clarify.  If you requested that your care partner not be given the details of your procedure findings, then the procedure report has been included in a sealed envelope for you to review at your convenience later.  YOU SHOULD EXPECT: Some feelings of bloating in the abdomen. Passage of more gas than usual.  Walking can help get rid of the air that was put into your GI tract during the procedure and reduce the bloating. If you had a lower endoscopy (such as a colonoscopy or flexible sigmoidoscopy) you may notice spotting of blood in your stool or on the toilet paper. If you underwent a bowel prep for your procedure, then you may not have a normal bowel movement for a few days.  DIET: follow dilatation diet today   ACTIVITY: Your care partner should take you home directly after the procedure.  You should plan to take it easy, moving slowly for the rest of the day.  You can resume normal activity the day after the procedure however you should NOT DRIVE or use heavy machinery for 24 hours (because of the sedation medicines used during the test).    SYMPTOMS TO REPORT IMMEDIATELY: A gastroenterologist can be reached at any hour.  During normal business hours, 8:30 AM to 5:00 PM Monday through Friday, call [phone].  After hours and on weekends, please call the GI answering service at [phone] who will take a message and have the physician on call contact you.     Following upper endoscopy (EGD)  Vomiting of blood or coffee ground material  New chest pain or pain under the shoulder blades  Painful or persistently difficult swallowing  New shortness of breath  Fever of 100F or higher  Black, tarry-looking  stools  FOLLOW UP: If any biopsies were taken you will be contacted by phone or by letter within the next 1-3 weeks.  Call your gastroenterologist if you have not heard about the biopsies in 3 weeks.  Our staff will call the home number listed on your records the next business day following your procedure to check on you and address any questions or concerns that you may have at that time regarding the information given to you following your procedure. This is a courtesy call and so if there is no answer at the home number and we have not heard from you through the emergency physician on call, we will assume that you have returned to your regular daily activities without incident.  SIGNATURES/CONFIDENTIALITY: You and/or your care partner have signed paperwork which will be entered into your electronic medical record.  These signatures attest to the fact that that the information above on your After Visit Summary has been reviewed and is understood.  Full responsibility of the confidentiality of this discharge information lies with you and/or your care-partner.   Follow dilatation diet today     Information on GERD diet and reflux information given to you today

## 2014-01-21 NOTE — Progress Notes (Signed)
Procedure ends, to recovery, report given and VSS. 

## 2014-01-21 NOTE — Op Note (Signed)
[place]  [address] [place], [address]   ENDOSCOPY PROCEDURE REPORT  PATIENT: Erica, Giles  MR#: [id number] BIRTHDATE: 1956/05/13 , 58  yrs. old GENDER: Female ENDOSCOPIST: Jerene Bears, MD PROCEDURE DATE:  01/21/2014 PROCEDURE:  EGD w/ biopsy; EGD with balloon dilation ASA CLASS:     Class II INDICATIONS:  Dysphagia.   history of GERD. MEDICATIONS: MAC sedation, administered by CRNA and propofol (Diprivan) 230mg  IV TOPICAL ANESTHETIC: none  DESCRIPTION OF PROCEDURE: After the risks benefits and alternatives of the procedure were thoroughly explained, informed consent was obtained.  The LB [id number] endoscope was introduced through the mouth and advanced to the second portion of the duodenum. Without limitations.  The instrument was slowly withdrawn as the mucosa was fully examined.   ESOPHAGUS: A Schatzki ring was found 36 cm from the incisors. Balloon dilation was performed using TTS balloon.  No resistance at 15 or 16.5 mm. Maximum dilation 18 mm.  Post dilation appearance did not reveal ring disruption, thus cold forcep biopsies were used circumferentially around the ring with good result.   A 2 cm hiatal hernia was noted.   The esophagus was otherwise normal.  STOMACH: There was mild antral gastropathy noted.  Cold forcep biopsies were taken at the antrum and angularis.  DUODENUM: The duodenal mucosa showed no abnormalities in the bulb and second portion of the duodenum.  Retroflexed views revealed a hiatal hernia.     The scope was then withdrawn from the patient and the procedure completed.  COMPLICATIONS: There were no complications.  ENDOSCOPIC IMPRESSION: 1.   Schatzki ring was found 36 cm from the incisors; Balloon dilation to 18 mm; multiple biopsies 2.   2 cm hiatal hernia 3.   The esophagus was otherwise normal. 4.   There was mild antral gastropathy noted; biopsies 5.   The duodenal mucosa showed no abnormalities in the  bulb and second portion of the duodenum  RECOMMENDATIONS: 1.  Await pathology results 2.  Continue taking your PPI (pantoprazole) once daily.  It is best to be taken 20-30 minutes prior to breakfast meal. 3.  GERD diet; Office follow-up as needed; repeat dilation as needed  eSigned:  Jerene Bears, MD 01/21/2014 10:39 AM      CC:The Patient and Rory Percy, MD

## 2014-01-22 ENCOUNTER — Telehealth: Payer: Self-pay | Admitting: *Deleted

## 2014-01-22 NOTE — Telephone Encounter (Signed)
No answer, left message to call if questions or concerns. 

## 2014-01-28 ENCOUNTER — Other Ambulatory Visit: Payer: Self-pay

## 2014-01-28 ENCOUNTER — Encounter: Payer: Self-pay | Admitting: Internal Medicine

## 2014-01-28 DIAGNOSIS — A048 Other specified bacterial intestinal infections: Secondary | ICD-10-CM

## 2014-01-28 MED ORDER — BIS SUBCIT-METRONID-TETRACYC 140-125-125 MG PO CAPS: 3.0000 | ORAL_CAPSULE | Freq: Three times a day (TID) | ORAL | Status: DC

## 2014-02-22 ENCOUNTER — Other Ambulatory Visit (INDEPENDENT_AMBULATORY_CARE_PROVIDER_SITE_OTHER): Payer: BC Managed Care – PPO

## 2014-02-22 ENCOUNTER — Encounter: Payer: Self-pay | Admitting: Internal Medicine

## 2014-02-22 ENCOUNTER — Ambulatory Visit (INDEPENDENT_AMBULATORY_CARE_PROVIDER_SITE_OTHER): Payer: BC Managed Care – PPO | Admitting: Internal Medicine

## 2014-02-22 VITALS — BP 124/60 | HR 80 | Ht 64.5 in | Wt 216.2 lb

## 2014-02-22 DIAGNOSIS — K921 Melena: Secondary | ICD-10-CM

## 2014-02-22 DIAGNOSIS — Q393 Congenital stenosis and stricture of esophagus: Secondary | ICD-10-CM

## 2014-02-22 DIAGNOSIS — A048 Other specified bacterial intestinal infections: Secondary | ICD-10-CM

## 2014-02-22 DIAGNOSIS — K222 Esophageal obstruction: Secondary | ICD-10-CM

## 2014-02-22 DIAGNOSIS — Z1211 Encounter for screening for malignant neoplasm of colon: Secondary | ICD-10-CM

## 2014-02-22 DIAGNOSIS — K219 Gastro-esophageal reflux disease without esophagitis: Secondary | ICD-10-CM

## 2014-02-22 DIAGNOSIS — Q391 Atresia of esophagus with tracheo-esophageal fistula: Secondary | ICD-10-CM

## 2014-02-22 LAB — CBC WITH DIFFERENTIAL/PLATELET
BASOS ABS: 0 10*3/uL (ref 0.0–0.1)
Basophils Relative: 0.4 % (ref 0.0–3.0)
Eosinophils Absolute: 0.1 10*3/uL (ref 0.0–0.7)
Eosinophils Relative: 1 % (ref 0.0–5.0)
HCT: 36.1 % (ref 36.0–46.0)
Hemoglobin: 12 g/dL (ref 12.0–15.0)
Lymphocytes Relative: 38.7 % (ref 12.0–46.0)
Lymphs Abs: 2.7 10*3/uL (ref 0.7–4.0)
MCHC: 33.3 g/dL (ref 30.0–36.0)
MCV: 90.3 fl (ref 78.0–100.0)
MONOS PCT: 7.6 % (ref 3.0–12.0)
Monocytes Absolute: 0.5 10*3/uL (ref 0.1–1.0)
NEUTROS ABS: 3.7 10*3/uL (ref 1.4–7.7)
Neutrophils Relative %: 52.3 % (ref 43.0–77.0)
PLATELETS: 250 10*3/uL (ref 150.0–400.0)
RBC: 3.99 Mil/uL (ref 3.87–5.11)
RDW: 14 % (ref 11.5–15.5)
WBC: 7.1 10*3/uL (ref 4.0–10.5)

## 2014-02-22 LAB — COMPREHENSIVE METABOLIC PANEL
ALT: 26 U/L (ref 0–35)
AST: 20 U/L (ref 0–37)
Albumin: 3.9 g/dL (ref 3.5–5.2)
Alkaline Phosphatase: 81 U/L (ref 39–117)
BILIRUBIN TOTAL: 0.3 mg/dL (ref 0.2–1.2)
BUN: 15 mg/dL (ref 6–23)
CO2: 23 meq/L (ref 19–32)
CREATININE: 1.1 mg/dL (ref 0.4–1.2)
Calcium: 9.1 mg/dL (ref 8.4–10.5)
Chloride: 105 mEq/L (ref 96–112)
GFR: 53.27 mL/min — AB (ref >60.00)
GLUCOSE: 144 mg/dL — AB (ref 70–99)
Potassium: 4.1 mEq/L (ref 3.5–5.1)
Sodium: 138 mEq/L (ref 135–145)
Total Protein: 7.5 g/dL (ref 6.0–8.3)

## 2014-02-22 LAB — IBC PANEL
IRON: 49 ug/dL (ref 42–145)
Saturation Ratios: 12.9 % — ABNORMAL LOW (ref 20.0–50.0)
TRANSFERRIN: 271.5 mg/dL (ref 212.0–360.0)

## 2014-02-22 LAB — PROTIME-INR
INR: 0.8 ratio (ref 0.8–1.0)
Prothrombin Time: 9.2 s — ABNORMAL LOW (ref 9.6–13.1)

## 2014-02-22 LAB — FERRITIN: Ferritin: 9.3 ng/mL — ABNORMAL LOW (ref 10.0–291.0)

## 2014-02-22 MED ORDER — PEG-KCL-NACL-NASULF-NA ASC-C 100 G PO SOLR: 1.0000 | Freq: Once | ORAL | Status: DC

## 2014-02-22 MED ORDER — PANTOPRAZOLE SODIUM 40 MG PO TBEC: 40.0000 mg | DELAYED_RELEASE_TABLET | Freq: Two times a day (BID) | ORAL | Status: AC

## 2014-02-22 NOTE — Patient Instructions (Signed)
Increase your Protonix 40 mg to one tablet by mouth twice daily. A new prescription has been sent to your pharmacy.   Your physician has requested that you go to the basement for the following lab work before leaving today:CBC, Cmet, INR, Iron studies.   You have been scheduled for an endoscopy and colonoscopy. Please follow the written instructions given to you at your visit today. Please pick up your prep at the pharmacy within the next 1-3 days. If you use inhalers (even only as needed), please bring them with you on the day of your procedure. Your physician has requested that you go to www.startemmi.com and enter the access code given to you at your visit today. This web site gives a general overview about your procedure. However, you should still follow specific instructions given to you by our office regarding your preparation for the procedure.  cc: Rory Percy, MD

## 2014-02-22 NOTE — Progress Notes (Signed)
Subjective:    Patient ID: Erica Giles, female    DOB: 08/26/1955, 58 y.o.   MRN: [id number]  HPI Erica Giles is a 58 year old female with past medical history of GERD with Schatzki's ring, H. pylori gastritis treated recently with Pylera, diabetes, hypertension, degenerative disc disease who is seen in followup. She was seen in July 2015 to evaluate solid food dysphagia. She came for upper endoscopy which was performed on 01/21/2014. This revealed a Schatzki's ring which was dilated with TTS balloon to 18 mm. Multiple biopsies were performed to disrupt the ring. 2 cm hiatal hernia. Mild antral gastropathy which was biopsied and a normal-appearing duodenum. This revealed benign squamous mucosa with features suggestive of reflux. No metaplasia. Stomach biopsies chronic, minimally active gastritis with H. pylori. She was continued on pantoprazole 40 mg daily.  Today she returns for followup with her husband. She states that approximately 2 weeks after her procedure she developed black and tarry stools which were also loose. This was occurring 2-3 times per day. Normal bowel movement for her is one stool every 2 days. This was associated with mild lower abdominal cramping gas and bloating. She has noted less energy and occasional dizziness and weakness. No nausea or vomiting. She has continued pantoprazole 40 mg daily. She uses ibuprofen 800 mg but rarely. She did complete Pylera. Of note, her stools became more normal appearing With today's bowel movement. She denies bright red blood per rectum and hematochezia.   Review of Systems As per history of present illness, otherwise negative  Current Medications, Allergies, Past Medical History, Past Surgical History, Family History and Social History were reviewed in [employer] record.     Objective:   Physical Exam BP 124/60  Pulse 80  Ht 5' 4.5" (1.638 m)  Wt 216 lb 4 oz (98.09 kg)  BMI 36.56 kg/m2 Constitutional:  Well-developed and well-nourished. No distress. HEENT: Normocephalic and atraumatic. Oropharynx is clear and moist. No oropharyngeal exudate. Conjunctivae are normal.  No scleral icterus. Neck: Neck supple. Trachea midline. Cardiovascular: Normal rate, regular rhythm and intact distal pulses. No M/R/G Pulmonary/chest: Effort normal and breath sounds normal. No wheezing, rales or rhonchi. Abdominal: Soft, nontender, nondistended. Bowel sounds active throughout. Rectal: No masses or tenderness. Soft brown stool in the rectal vault, heme-negative Extremities: no clubbing, cyanosis, or edema Neurological: Alert and oriented to person place and time. Skin: Skin is warm and dry. No rashes noted. Psychiatric: Normal mood and affect. Behavior is normal.  CBC    Component Value Date/Time   WBC 7.0 06/29/2011 1525   RBC 3.81* 06/29/2011 1525   HGB 11.7* 06/29/2011 1525   HCT 34.7* 06/29/2011 1525   PLT 208 06/29/2011 1525   MCV 91.1 06/29/2011 1525   MCH 30.7 06/29/2011 1525   MCHC 33.7 06/29/2011 1525   RDW 13.0 06/29/2011 1525    CBC pending today  Colonoscopy received dated 03/17/2005  --Exam to the cecum, no polyps or diverticulosis. Internal and external hemorrhoids.  --Based on this result I recommend repeat colonoscopy in October 2016 for screening     Assessment & Plan:  58 year old female with past medical history of GERD with Schatzki's ring, H. pylori gastritis treated recently with Pylera, diabetes, hypertension, degenerative disc disease who is seen in followup.  1. ? Melena with change in bowel habits -- per history sounds consistent with melena, however her stool today are heme-negative. It is possible that her bowel habit change and dark stool was secondary to antibiotics/Pylera  for H. pylori. Metronidazole can cause darkening of the stools. She also has recently had an upper endoscopy which did not reveal severe gastritis or ulcer disease and she has been on PPI. All these things  make an gastric or duodenal bulb source less likely. Orthostatics done today and she is not orthostatic. CBC and iron studies today. Check CMP and INR. Increase pantoprazole to twice a day for now. --I'm scheduling her for upper endoscopy and colonoscopy to evaluate possible bleeding --If blood counts are normal today and bowel habits returned to normal, then these tests can likely be canceled, otherwise we'll proceed to upper and lower endoscopy. We discussed the test including the risks and benefits and she is agreeable to proceed.  2. GERD with dysphagia -- resolution of dysphagia after dilation. She will continue PPI as above  3. CRC screening -- she is due repeat colonoscopy in October 2016 for screening, though this may be done sooner to investigate bleeding and above.

## 2014-02-26 ENCOUNTER — Other Ambulatory Visit: Payer: Self-pay

## 2014-02-26 ENCOUNTER — Encounter: Payer: Self-pay | Admitting: Internal Medicine

## 2014-02-26 DIAGNOSIS — D509 Iron deficiency anemia, unspecified: Secondary | ICD-10-CM

## 2014-02-26 MED ORDER — INTEGRA 62.5-62.5-40-3 MG PO CAPS: 1.0000 | ORAL_CAPSULE | Freq: Every day | ORAL | Status: DC

## 2014-03-14 ENCOUNTER — Telehealth: Payer: Self-pay

## 2014-03-14 NOTE — Telephone Encounter (Signed)
Spoke with pt and she is aware.

## 2014-03-14 NOTE — Telephone Encounter (Signed)
Message copied by Algernon Huxley on Thu Mar 14, 2014  1:56 PM ------      Message from: HUNT, Virginia R      Created: Tue Feb 26, 2014  3:00 PM      Regarding: CBC       Pt needs CBC, order in ------

## 2014-03-14 NOTE — Telephone Encounter (Signed)
Left message for pt to call back  °

## 2014-03-27 ENCOUNTER — Ambulatory Visit (AMBULATORY_SURGERY_CENTER): Payer: BC Managed Care – PPO | Admitting: Internal Medicine

## 2014-03-27 ENCOUNTER — Encounter: Payer: Self-pay | Admitting: Internal Medicine

## 2014-03-27 VITALS — BP 153/80 | HR 57 | Temp 97.6°F | Resp 29 | Ht 64.0 in | Wt 216.0 lb

## 2014-03-27 DIAGNOSIS — D123 Benign neoplasm of transverse colon: Secondary | ICD-10-CM

## 2014-03-27 DIAGNOSIS — K649 Unspecified hemorrhoids: Secondary | ICD-10-CM

## 2014-03-27 DIAGNOSIS — Z1211 Encounter for screening for malignant neoplasm of colon: Secondary | ICD-10-CM

## 2014-03-27 MED ORDER — SODIUM CHLORIDE 0.9 % IV SOLN: 500.0000 mL | INTRAVENOUS | Status: DC

## 2014-03-27 MED ORDER — PRAMOXINE-HC 1-1 % EX CREA: TOPICAL_CREAM | Freq: Three times a day (TID) | CUTANEOUS | Status: DC

## 2014-03-27 NOTE — Progress Notes (Signed)
Pt phoned in this morning at 1130 am concerned because her stools are liquid but still black in color. Discussed with Dr. Hilarie Fredrickson, he encouraged her to try and drink 1-2 more glasses of water between now and noon to help flush things through. Upon arrival to admitting, reassess stool color and possibly do an enema prior to procedure.

## 2014-03-27 NOTE — Patient Instructions (Signed)

## 2014-03-27 NOTE — Op Note (Signed)
[place]  [address]. [place], [address]   COLONOSCOPY PROCEDURE REPORT  PATIENT: Erica Giles, Erica Giles  MR#: [id number] BIRTHDATE: 1955-12-29 , 58  yrs. old GENDER: female ENDOSCOPIST: Jerene Bears, MD PROCEDURE DATE:  03/27/2014 PROCEDURE:   Colonoscopy with snare polypectomy First Screening Colonoscopy - Avg.  risk and is 50 yrs.  old or older - No.  Prior Negative Screening - Now for repeat screening. 10 or more years since last screening  History of Adenoma - Now for follow-up colonoscopy & has been > or = to 3 yrs.  N/A  Polyps Removed Today? Yes. ASA CLASS:   Class II INDICATIONS:average risk for colon cancer and last colonoscopy completed  years ago. MEDICATIONS: Monitored anesthesia care and Propofol 300 mg IV; lidocaine 40 mg IV  DESCRIPTION OF PROCEDURE:   After the risks benefits and alternatives of the procedure were thoroughly explained, informed consent was obtained.  The digital rectal exam revealed external hemorrhoids.   The LB [id number]  endoscope was introduced through the anus and advanced to the terminal ileum which was intubated for a short distance. No adverse events experienced. The quality of the prep was good, using MoviPrep  The instrument was then slowly withdrawn as the colon was fully examined.   COLON FINDINGS: The examined terminal ileum appeared to be normal. Two sessile polyps ranging from 3 to 58mm in size were found in the transverse colon.   The examination was otherwise normal. Retroflexed views revealed internal hemorrhoids and external hemorrhoids. The time to cecum=5 minutes 17 seconds.  Withdrawal time=10 minutes 50 seconds.  The scope was withdrawn and the procedure completed. COMPLICATIONS: There were no immediate complications.  ENDOSCOPIC IMPRESSION: 1.   The examined terminal ileum appeared to be normal 2.   Two sessile polyps ranging from 3 to 22mm in size were found in the transverse colon 3.   The  examination was otherwise normal 4.   Internal and external hemorrhoids  RECOMMENDATIONS: 1.  Await pathology results 2.  If the polyps removed today are proven to be adenomatous (pre-cancerous) polyps, you will need a repeat colonoscopy in 5 years.  Otherwise you should continue to follow colorectal cancer screening guidelines for "routine risk" patients with colonoscopy in 10 years.  You will receive a letter within 1-2 weeks with the results of your biopsy as well as final recommendations.  Please call my office if you have not received a letter after 3 weeks. 3.  Monitor Hgb through primary care to ensure normalization (recently slightly low at 11.7) 4.  Analpram twice to three times daily for hemorrhoids, surgical referral if medically refractory  eSigned:  Jerene Bears, MD 03/27/2014 3:23 PM     cc: The Patient and Rory Percy, MD

## 2014-03-27 NOTE — Progress Notes (Signed)
Called to room to assist during endoscopic procedure.  Patient ID and intended procedure confirmed with present staff. Received instructions for my participation in the procedure from the performing physician.  

## 2014-03-27 NOTE — Progress Notes (Signed)
Pt stable to RR 

## 2014-03-28 ENCOUNTER — Telehealth: Payer: Self-pay

## 2014-03-28 NOTE — Telephone Encounter (Signed)
  Follow up Call-  Call back number 03/27/2014 01/21/2014  Post procedure Call Back phone  # [phone]  Permission to leave phone message Yes Yes     Patient questions:  Do you have a fever, pain , or abdominal swelling? No. Pain Score  0 *  Have you tolerated food without any problems? Yes.    Have you been able to return to your normal activities? Yes.    Do you have any questions about your discharge instructions: Diet   No. Medications  No. Follow up visit  No.  Do you have questions or concerns about your Care? No.  Actions: * If pain score is 4 or above: No action needed, pain <4.   No problems per the pt. maw

## 2014-04-02 ENCOUNTER — Encounter: Payer: Self-pay | Admitting: Internal Medicine

## 2014-04-08 ENCOUNTER — Telehealth: Payer: Self-pay

## 2014-04-08 NOTE — Telephone Encounter (Signed)
-----   Message from Maury Dus, RN sent at 01/28/2014  3:18 PM EDT ----- Regarding: H pylori stool test  Nov 2nd needs labs

## 2014-04-08 NOTE — Telephone Encounter (Signed)
Pt aware and order in epic. 

## 2014-04-10 ENCOUNTER — Other Ambulatory Visit: Payer: BC Managed Care – PPO

## 2014-05-21 ENCOUNTER — Telehealth: Payer: Self-pay

## 2014-05-21 NOTE — Telephone Encounter (Signed)
Left message for pt to call back  °

## 2014-05-21 NOTE — Telephone Encounter (Signed)
-----   Message from Maury Dus, RN sent at 02/26/2014  3:01 PM EDT ----- Regarding: Iron Studies Pt needs Iron studies in 3 mth, order in epic.

## 2014-05-23 NOTE — Telephone Encounter (Signed)
Spoke with pt and she is aware.

## 2014-05-27 ENCOUNTER — Other Ambulatory Visit (INDEPENDENT_AMBULATORY_CARE_PROVIDER_SITE_OTHER): Payer: BC Managed Care – PPO

## 2014-05-27 ENCOUNTER — Telehealth: Payer: Self-pay | Admitting: Internal Medicine

## 2014-05-27 DIAGNOSIS — D509 Iron deficiency anemia, unspecified: Secondary | ICD-10-CM

## 2014-05-27 DIAGNOSIS — A048 Other specified bacterial intestinal infections: Secondary | ICD-10-CM

## 2014-05-27 LAB — CBC WITH DIFFERENTIAL/PLATELET
Basophils Absolute: 0 10*3/uL (ref 0.0–0.1)
Basophils Relative: 0.3 % (ref 0.0–3.0)
Eosinophils Absolute: 0 10*3/uL (ref 0.0–0.7)
Eosinophils Relative: 0.6 % (ref 0.0–5.0)
HCT: 34.9 % — ABNORMAL LOW (ref 36.0–46.0)
Hemoglobin: 11.7 g/dL — ABNORMAL LOW (ref 12.0–15.0)
LYMPHS PCT: 37.6 % (ref 12.0–46.0)
Lymphs Abs: 2.2 10*3/uL (ref 0.7–4.0)
MCHC: 33.5 g/dL (ref 30.0–36.0)
MCV: 89.9 fl (ref 78.0–100.0)
Monocytes Absolute: 0.4 10*3/uL (ref 0.1–1.0)
Monocytes Relative: 6.3 % (ref 3.0–12.0)
Neutro Abs: 3.2 10*3/uL (ref 1.4–7.7)
Neutrophils Relative %: 55.2 % (ref 43.0–77.0)
PLATELETS: 215 10*3/uL (ref 150.0–400.0)
RBC: 3.88 Mil/uL (ref 3.87–5.11)
RDW: 14.3 % (ref 11.5–15.5)
WBC: 5.8 10*3/uL (ref 4.0–10.5)

## 2014-05-27 LAB — IBC PANEL
Iron: 71 ug/dL (ref 42–145)
Saturation Ratios: 19.6 % — ABNORMAL LOW (ref 20.0–50.0)
TRANSFERRIN: 258.4 mg/dL (ref 212.0–360.0)

## 2014-05-27 NOTE — Telephone Encounter (Signed)
Spoke with pt and she is bringing the kit in today and having additional labs done.

## 2014-06-01 LAB — H. PYLORI ANTIGEN, STOOL: H pylori Ag, Stl: NEGATIVE

## 2014-08-26 ENCOUNTER — Telehealth: Payer: Self-pay

## 2014-08-26 NOTE — Telephone Encounter (Signed)
Pt aware.

## 2014-08-26 NOTE — Telephone Encounter (Signed)
-----   Message from Maury Dus, RN sent at 05/29/2014  3:50 PM EST ----- Regarding: Labs Pt needs labs, orders in epic.

## 2014-09-10 ENCOUNTER — Telehealth: Payer: Self-pay | Admitting: *Deleted

## 2014-09-10 DIAGNOSIS — D649 Anemia, unspecified: Secondary | ICD-10-CM

## 2014-09-10 NOTE — Telephone Encounter (Signed)
Patient has been advised of information below. She would like to get labs at her work around 11/29/14. I have mailed orders to her home address.

## 2014-09-10 NOTE — Telephone Encounter (Signed)
Dr Hilarie Fredrickson has received patient's recent CMET, IBC panel, CBC and ferritin from 08/29/14. He notes: "normal hemoglobin and iron studies now. Can stop oral iron for now, but would take multivitamin daily. Repeat Hemoglobin and iron studies in 3 months. I have left a message on patient's voicemail for her to call back.

## 2014-10-31 ENCOUNTER — Ambulatory Visit: Admit: 2014-10-31 | Primary: Family Medicine

## 2014-10-31 ENCOUNTER — Encounter

## 2014-10-31 ENCOUNTER — Inpatient Hospital Stay: Attending: Orthopaedic Surgery | Primary: Family Medicine

## 2014-10-31 DIAGNOSIS — M1611 Unilateral primary osteoarthritis, right hip: Secondary | ICD-10-CM

## 2014-11-01 LAB — EKG 12-LEAD
Atrial Rate: 83 {beats}/min
P Axis: 68 degrees
P-R Interval: 138 ms
Q-T Interval: 406 ms
QRS Duration: 88 ms
QTc Calculation (Bazett): 477 ms
R Axis: 17 degrees
T Axis: 74 degrees
Ventricular Rate: 83 {beats}/min

## 2015-02-27 ENCOUNTER — Encounter: Admit: 2015-02-27 | Primary: Family Medicine

## 2015-02-27 ENCOUNTER — Inpatient Hospital Stay
Admit: 2015-02-27 | Discharge: 2015-02-27 | Disposition: A | Discharge status: Home / Self Care | Attending: Emergency Medicine

## 2015-02-27 DIAGNOSIS — S20212A Contusion of left front wall of thorax, initial encounter: Secondary | ICD-10-CM

## 2015-02-27 MED ORDER — HYDROCODONE-ACETAMINOPHEN 5-325 MG PO TABS
5-325 MG | ORAL_TABLET | Freq: Four times a day (QID) | ORAL | 0 refills | Status: AC | PRN
Start: 2015-02-27 — End: 2015-03-06

## 2015-02-27 MED ORDER — IBUPROFEN 800 MG PO TABS
800 MG | ORAL_TABLET | Freq: Three times a day (TID) | ORAL | 0 refills | Status: AC | PRN
Start: 2015-02-27 — End: ?

## 2015-02-27 MED ORDER — CYCLOBENZAPRINE HCL 10 MG PO TABS
10 MG | ORAL_TABLET | Freq: Three times a day (TID) | ORAL | 0 refills | Status: AC | PRN
Start: 2015-02-27 — End: 2015-03-09

## 2015-02-27 NOTE — ED Notes (Signed)
Pt continues to voice significant pain, but states that she does not want pain medications here. States that she just wants to get her prescriptions filled and go home. Pt alert and oriented. Respirations regular and easy. Prescriptions given and instructed on. Discharge instructions reviewed. States understanding. Pt discharged in satisfactory condition. Instructed to return for any concerns.     Omer JackMelissa Divante Kotch, RN  02/27/15 [phone]

## 2015-02-27 NOTE — ED Provider Notes (Signed)
[facility]  [facility]  eMERGENCY dEPARTMENT eNCOUnter      Pt Name: Hayley Stephens  MRN: [id number]  Birthdate 18-Feb-1956  Date of evaluation: 02/27/15      CHIEF COMPLAINT       Chief Complaint   Patient presents with   ??? Rib Injury       Nurses Notes reviewed and I agree except as noted in the HPI.      HISTORY OF PRESENT ILLNESS    Hayley Stephens is a 59 y.o. female who presents by private car???driving herself here complaining of left-sided rib cage pain.  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  She received a sharp object punching her in the left side of the rib cage and his had severe pain since then.  Did not hit her head or neck or injury herself otherwise.  She has localized pain in Her outer left rib chest wall and hurts to take a deep breath.  No hemoptysis no abnormalities.  Drove herself here.  She is adamantly refusing receiving any pain medication while she is here.     REVIEW OF SYSTEMS     Review of Systems   Constitutional: Negative for appetite change, chills, fatigue and fever.   HENT: Negative for congestion, ear discharge, ear pain, rhinorrhea, sinus pressure and sore throat.    Eyes: Negative for pain, redness and itching.   Respiratory: Negative for cough, shortness of breath and wheezing.    Cardiovascular: Negative for chest pain, palpitations and leg swelling.   Gastrointestinal: Negative for abdominal pain, constipation, diarrhea, nausea and vomiting.   Genitourinary: Negative for difficulty urinating, dysuria, flank pain, frequency, menstrual problem, urgency and vaginal discharge.   Musculoskeletal: Negative for arthralgias, joint swelling and myalgias.   Skin: Negative for rash.   Neurological: Negative for dizziness, weakness, light-headedness, numbness and headaches.   Hematological:  Does not bruise/bleed easily.   Psychiatric/Behavioral: Negative for agitation and sleep disturbance. The patient is not nervous/anxious.         PAST MEDICAL HISTORY    has a past medical history of Hypertension.    SURGICAL HISTORY      has a past surgical history that includes hip surgery (Right) and Hysterectomy.    CURRENT MEDICATIONS       Discharge Medication List as of 02/27/2015  3:14 PM      CONTINUE these medications which have NOT CHANGED    Details   lisinopril (PRINIVIL;ZESTRIL) 10 MG tablet Take 10 mg by mouth daily             ALLERGIES     has No Known Allergies.    FAMILY HISTORY     has no family status information on file.  family history is not on file.    SOCIAL HISTORY      reports that she has been smoking.  She has been smoking about 0.50 packs per day. She does not have any smokeless tobacco history on file. She reports that she does not drink alcohol or use illicit drugs.    PHYSICAL EXAM     INITIAL VITALS:  height is 5' 1.5" (1.562 m) and weight is 166 lb (75.3 kg). Her temperature is 98.4 ??F (36.9 ??C). Her blood pressure is 141/91 (  abnormal) and her pulse is 111. Her respiration is 17 and oxygen saturation is 97%.    Physical Exam   Constitutional: She is oriented to person, place, and time. She appears well-developed and well-nourished.   Tearful female seated upright in examination area guarding her left rib cage complaining of pain   HENT:   Head: Normocephalic and atraumatic.   Right Ear: External ear normal.   Left Ear: External ear normal.   Eyes: Right eye exhibits no discharge. Left eye exhibits no discharge. No scleral icterus.   Neck: No tracheal deviation present.   Pulmonary/Chest: Effort normal. No stridor. No respiratory distress.   Focused examination showed diminished breath sounds on the left because of guarding.  The left chest wall is very tender in the upper and lateral aspects of the rib cage.  There is no external ecchymosis or deformity or step-off of the ribs and  I can palpate very tender to touch.  Regard.   Musculoskeletal: Normal range of motion.   Neurological: She is oriented to person, place, and time.   Skin: Skin is warm and dry. She is not diaphoretic.   Psychiatric: She has a normal mood and affect. Her behavior is normal.   Nursing note and vitals reviewed.      DIFFERENTIAL DIAGNOSIS:   Contusion versus fracture left chest wall and rib  Fall in the workplace as source of injury    DIAGNOSTIC RESULTS       RADIOLOGY: non-plain film images(s) such as CT, Ultrasound and MRI are read by the radiologist.  Plain radiographic images are visualized and preliminarily interpreted by the emergency physician unless otherwise stated below.  Left rib detail x-rays and single view chest were negative for fracture as reviewed by Dr. Andee Poles time service    LABS:   Labs Reviewed - No data to display    EMERGENCY DEPARTMENT COURSE:   Vitals:    Vitals:    02/27/15 1351   BP: (!) 141/91   Pulse: 111   Resp: 17   Temp: 98.4 ??F (36.9 ??C)   SpO2: 97%   Weight: 166 lb (75.3 kg)   Height: 5' 1.5" (1.562 m)     Patient informed that she has a chest wall contusion.  Cervical have an occult rib fracture or 2.  Her pain is in needed control but she is denying administration of a pain control at this time.  She is agreeable to receive prescriptions.  She states she only lives a short distance from the ER and is fine driving home.  She is hemodynamically stable.  I advised her she prior to have a great deal of pain next few days and the medication time off work and this was rendered        FINAL IMPRESSION      1. Contusion of chest wall, left, initial encounter    2. Fall from other slipping, tripping, or stumbling    3. Work related injury          DISPOSITION/PLAN   Discharge home on pain control rest muscle relaxation and time off work along with work restrictions for sitdown work in the next few days    PATIENT REFERRED TO:  Yolanda Manges. [facility]  [address]  [place] [address]  [phone]  In 5 days  return here sooner, As needed, If symptoms worsen      DISCHARGE MEDICATIONS:  Discharge Medication List as of 02/27/2015  3:14 PM  START taking these medications    Details   HYDROcodone-acetaminophen (NORCO) 5-325 MG per tablet Take 1 tablet by mouth every 6 hours as needed for Pain, Disp-12 tablet, R-0      ibuprofen (ADVIL;MOTRIN) 800 MG tablet Take 1 tablet by mouth every 8 hours as needed for Pain, Disp-30 tablet, R-0      cyclobenzaprine (FLEXERIL) 10 MG tablet Take 1 tablet by mouth 3 times daily as needed for Muscle spasms, Disp-30 tablet, R-0             (Please note that portions of this note were completed with a voice recognition program.  Efforts were made to edit the dictations but occasionally words are mis-transcribed.)    Avis Epley, MD                 Avis Epley, MD  02/27/15 [phone]

## 2015-02-27 NOTE — ED Notes (Signed)
Pt fell at work in the [employer]. She fell onto a metal 'tray line," hitting her left chest. She is in obvious discomfort and states that it hurts to take a deep breath. Respirations regular without any use of accessory muscles. Lungs sound clear t/o.      Omer JackMelissa Erynne Kealey, RN  02/27/15 1420

## 2015-02-27 NOTE — Discharge Instructions (Signed)
Contusion: Care Instructions  Your Care Instructions  Contusion is the medical term for a bruise. It is the result of a direct blow or an impact, such as a fall. Contusions are common sports injuries.  Most people think of a bruise as a black-and-blue spot. This happens when small blood vessels get torn and leak blood under the skin. But bones, muscles, and organs can also get bruised. This may damage deep tissues but not cause a bruise you can see.  The doctor will do a physical exam to find the location of your contusion. You may also have tests to make sure you do not have a more serious injury, such as a broken bone or nerve damage. These may include X-rays or other imaging tests like a CT scan or MRI.  Deep-tissue contusions may cause pain and swelling. But if there is no serious damage, they will often get better in a few weeks with home treatment.  The doctor has checked you carefully, but problems can develop later. If you notice any problems or new symptoms, get medical treatment right away.  Follow-up care is a key part of your treatment and safety. Be sure to make and go to all appointments, and call your doctor if you are having problems. It's also a good idea to know your test results and keep a list of the medicines you take.  How can you care for yourself at home?   Put ice or a cold pack on the sore area for 10 to 20 minutes at a time to stop swelling. Put a thin cloth between the ice pack and your skin.   Be safe with medicines. Read and follow all instructions on the label.   If the doctor gave you a prescription medicine for pain, take it as prescribed.   If you are not taking a prescription pain medicine, ask your doctor if you can take an over-the-counter medicine.   If you can, prop up the sore area on pillows as much as possible for the next few days. Try to keep the sore area above the level of your heart.  When should you call for help?  Call your doctor now or seek immediate medical  care if:   Your pain gets worse.   You have new or worse swelling.   You have tingling, weakness, or numbness in the area near the contusion.   The area near the contusion is cold or pale.  Watch closely for changes in your health, and be sure to contact your doctor if:   You do not get better as expected.  Where can you learn more?  Go to [email or web address] and sign in to your MyChart account. Enter H828 in the Search Health Information box to learn more about "Contusion: Care Instructions."    If you do not have an account, please click on the "Sign Up Now" link.   2006-2016 Health[employer] Inc[employer]. Care instructions adapted under license by [facility]. This care instruction is for use with your licensed healthcare professional. If you have questions about a medical condition or this instruction, always ask your healthcare professional. Healthwise, Incorporated disclaims any warranty or liability for your use of this information.  Content Version: 10.9.538570; Current as of: Oct 26, 2013

## 2015-12-12 ENCOUNTER — Emergency Department (HOSPITAL_COMMUNITY): Payer: BLUE CROSS/BLUE SHIELD

## 2015-12-12 ENCOUNTER — Encounter (HOSPITAL_COMMUNITY): Payer: Self-pay | Admitting: *Deleted

## 2015-12-12 ENCOUNTER — Emergency Department (HOSPITAL_COMMUNITY)
Admission: EM | Admit: 2015-12-12 | Discharge: 2015-12-13 | Disposition: A | Discharge status: Home / Self Care | Payer: BLUE CROSS/BLUE SHIELD | Attending: Emergency Medicine | Admitting: Emergency Medicine

## 2015-12-12 DIAGNOSIS — Z79899 Other long term (current) drug therapy: Secondary | ICD-10-CM | POA: Insufficient documentation

## 2015-12-12 DIAGNOSIS — F329 Major depressive disorder, single episode, unspecified: Secondary | ICD-10-CM | POA: Diagnosis not present

## 2015-12-12 DIAGNOSIS — Y929 Unspecified place or not applicable: Secondary | ICD-10-CM | POA: Diagnosis not present

## 2015-12-12 DIAGNOSIS — I10 Essential (primary) hypertension: Secondary | ICD-10-CM | POA: Diagnosis not present

## 2015-12-12 DIAGNOSIS — S42302A Unspecified fracture of shaft of humerus, left arm, initial encounter for closed fracture: Secondary | ICD-10-CM

## 2015-12-12 DIAGNOSIS — Y999 Unspecified external cause status: Secondary | ICD-10-CM | POA: Diagnosis not present

## 2015-12-12 DIAGNOSIS — S59912A Unspecified injury of left forearm, initial encounter: Secondary | ICD-10-CM | POA: Diagnosis present

## 2015-12-12 DIAGNOSIS — Z87891 Personal history of nicotine dependence: Secondary | ICD-10-CM | POA: Insufficient documentation

## 2015-12-12 DIAGNOSIS — S42252A Displaced fracture of greater tuberosity of left humerus, initial encounter for closed fracture: Secondary | ICD-10-CM | POA: Diagnosis not present

## 2015-12-12 DIAGNOSIS — E119 Type 2 diabetes mellitus without complications: Secondary | ICD-10-CM | POA: Insufficient documentation

## 2015-12-12 DIAGNOSIS — M1990 Unspecified osteoarthritis, unspecified site: Secondary | ICD-10-CM | POA: Diagnosis not present

## 2015-12-12 DIAGNOSIS — Y939 Activity, unspecified: Secondary | ICD-10-CM | POA: Diagnosis not present

## 2015-12-12 DIAGNOSIS — Z791 Long term (current) use of non-steroidal anti-inflammatories (NSAID): Secondary | ICD-10-CM | POA: Diagnosis not present

## 2015-12-12 DIAGNOSIS — W109XXA Fall (on) (from) unspecified stairs and steps, initial encounter: Secondary | ICD-10-CM | POA: Diagnosis not present

## 2015-12-12 MED ORDER — FENTANYL CITRATE (PF) 100 MCG/2ML IJ SOLN
50.0000 ug | Freq: Once | INTRAMUSCULAR | Status: AC
  Administered 2015-12-12: 50 ug via INTRAVENOUS
  Filled 2015-12-12: qty 2

## 2015-12-12 MED ORDER — ONDANSETRON HCL 4 MG/2ML IJ SOLN
4.0000 mg | Freq: Once | INTRAMUSCULAR | Status: AC
  Administered 2015-12-12: 4 mg via INTRAVENOUS
  Filled 2015-12-12: qty 2

## 2015-12-12 NOTE — ED Notes (Signed)
Pt states she fell down 2 stairs and fell on her left arm. Pt c/o upper left arm pain.

## 2015-12-13 MED ORDER — OXYCODONE-ACETAMINOPHEN 5-325 MG PO TABS: 1.0000 | ORAL_TABLET | ORAL | Status: DC | PRN

## 2015-12-13 MED ORDER — MORPHINE SULFATE (PF) 4 MG/ML IV SOLN
4.0000 mg | Freq: Once | INTRAVENOUS | Status: AC
  Administered 2015-12-13: 4 mg via INTRAVENOUS

## 2015-12-13 MED ORDER — LORAZEPAM 1 MG PO TABS: 1.0000 mg | ORAL_TABLET | Freq: Four times a day (QID) | ORAL | Status: DC | PRN

## 2015-12-13 MED ORDER — DIAZEPAM 2 MG PO TABS: 2.0000 mg | ORAL_TABLET | Freq: Four times a day (QID) | ORAL | Status: DC | PRN

## 2015-12-13 MED ORDER — FENTANYL CITRATE (PF) 100 MCG/2ML IJ SOLN: 50.0000 ug | Freq: Once | INTRAMUSCULAR | Status: DC

## 2015-12-13 MED ORDER — MORPHINE SULFATE (PF) 4 MG/ML IV SOLN
INTRAVENOUS | Status: AC
  Filled 2015-12-13: qty 1

## 2015-12-13 MED ORDER — MIDAZOLAM HCL 2 MG/2ML IJ SOLN
INTRAMUSCULAR | Status: AC
  Filled 2015-12-13: qty 2

## 2015-12-13 MED ORDER — FENTANYL CITRATE (PF) 100 MCG/2ML IJ SOLN
50.0000 ug | Freq: Once | INTRAMUSCULAR | Status: DC
  Filled 2015-12-13: qty 2

## 2015-12-13 MED ORDER — MIDAZOLAM HCL 2 MG/2ML IJ SOLN
2.0000 mg | Freq: Once | INTRAMUSCULAR | Status: AC
  Administered 2015-12-13: 2 mg via INTRAVENOUS

## 2015-12-13 MED ORDER — FENTANYL CITRATE (PF) 100 MCG/2ML IJ SOLN
50.0000 ug | Freq: Once | INTRAMUSCULAR | Status: AC
  Administered 2015-12-13: 50 ug via INTRAVENOUS

## 2015-12-13 NOTE — ED Notes (Signed)
Splint and arm sling applied by myself, Ron, RN and Evalee Jefferson, PA. Radial pulse at baseline and splint okay per Idol, PA.

## 2015-12-13 NOTE — ED Provider Notes (Signed)
CSN: [id number]     Arrival date & time 12/12/15  2233 History   First MD Initiated Contact with Patient 12/12/15 2245     Chief Complaint  Patient presents with  . Arm Injury     (Consider location/radiation/quality/duration/timing/severity/associated sxs/prior Treatment) The history is provided by the patient.   Erica Giles is a 60 y.o. right handed female presenting with pain in her left mid upper arm since falling and landing on her outstretched arm. She missed the bottom 2 steps on her deck and landed  on a wooden surface.  She denies any other pain in the distal arm or hand and denies hitting her head and has no neck or back pain.  She presents wearing a sling which has provided minimal relief.  She endorses mild tingling in the 4th and 5th fingers of the left hand.  She has had no other treatments prior to arrival.  Past medical history most pertinent for right rotator cuff repair and trigger finger release.  Her orthopedist is Dr. Tonita Cong with [facility].   Past Medical History  Diagnosis Date  . Hypertension   . Diabetes mellitus     ORAL MED  . GERD (gastroesophageal reflux disease)   . Shortness of breath     WITH EXERTION-PT RELATES TO HER WEIGHT  . Sleep apnea     MODERATE- PER SLEEP STUDY 06/2010--PT USES CPAP-SETTING IS 11  . Arthritis   . Plantar fasciitis     BILATERAL  . HNP (herniated nucleus pulposus), lumbar     PT TRYING TO AVOID LUMBAR SURGERY--STATES PAIN IN HER BACK AND SOMETIMES DOWN ONE OR THE OTHER LEG  . Depression   . Schatzki's ring   . Hiatal hernia   . Helicobacter pylori gastritis    Past Surgical History  Procedure Laterality Date  . Abdominal hysterectomy    . Cholecystectomy    . Tonsillectomy    . Carpal tunnel release      BILATERAL  . Trigger finger repair    . Cyst removed      LEFT THUMB AND RT MIDDLE FINGER   Family History  Problem Relation Age of Onset  . Diabetes Mother   . Irritable bowel syndrome Mother   . Diabetes Father     Social History  Substance Use Topics  . Smoking status: Former Smoker -- 1.00 packs/day for 25 years  . Smokeless tobacco: Never Used     Comment: 03/2008  . Alcohol Use: No   OB History    No data available     Review of Systems  Constitutional: Negative for fever.  Musculoskeletal: Positive for arthralgias. Negative for myalgias and joint swelling.  Skin: Negative for wound.  Neurological: Positive for numbness. Negative for weakness.      Allergies  Hydrocodone and Percocet  Home Medications   Prior to Admission medications   Medication Sig Start Date End Date Taking? Authorizing Provider  atorvastatin (LIPITOR) 10 MG tablet Take 10 mg by mouth at bedtime.   Yes Historical Provider, MD  citalopram (CELEXA) 20 MG tablet Take 20 mg by mouth at bedtime.   Yes Historical Provider, MD  gabapentin (NEURONTIN) 100 MG capsule Take 200 mg by mouth 3 (three) times daily.   Yes Historical Provider, MD  ibuprofen (ADVIL,MOTRIN) 800 MG tablet Take 800 mg by mouth every 8 (eight) hours as needed. Pain    Yes Historical Provider, MD  lisinopril (PRINIVIL,ZESTRIL) 20 MG tablet Take 20 mg by mouth at  bedtime.   Yes Historical Provider, MD  metFORMIN (GLUCOPHAGE-XR) 500 MG 24 hr tablet Take 1,000 mg by mouth 2 (two) times daily.   Yes Historical Provider, MD  Multiple Vitamin (MULTIVITAMIN WITH MINERALS) TABS tablet Take 1 tablet by mouth daily.   Yes Historical Provider, MD  pantoprazole (PROTONIX) 40 MG tablet Take 1 tablet (40 mg total) by mouth 2 (two) times daily. 02/22/14  Yes Jerene Bears, MD  SITagliptin Phosphate (JANUVIA PO) Take 1 tablet by mouth daily.   Yes Historical Provider, MD  LORazepam (ATIVAN) 1 MG tablet Take 1 tablet (1 mg total) by mouth every 6 (six) hours as needed (muscle spasm). 12/13/15   Evalee Jefferson, PA-C  oxyCODONE-acetaminophen (PERCOCET/ROXICET) 5-325 MG tablet Take 1 tablet by mouth every 4 (four) hours as needed. 12/13/15   Evalee Jefferson, PA-C   BP 113/52 mmHg   Pulse 71  Temp(Src) 98.2 F (36.8 C) (Oral)  Resp 16  Ht 5' 4.5" (1.638 m)  Wt 92.534 kg  BMI 34.49 kg/m2  SpO2 94% Physical Exam  Constitutional: She appears well-developed and well-nourished.  HENT:  Head: Atraumatic.  Neck: Normal range of motion.  Cardiovascular:  Pulses:      Radial pulses are 2+ on the right side, and 2+ on the left side.  Pulses equal bilaterally  Musculoskeletal: She exhibits tenderness.       Left upper arm: She exhibits bony tenderness and edema.  ttp left mid humerus with no obvious deformity.  Soft edema present, frequent bicep muscle spasm noted.  Elbow, forearm, wrist and hand nontender.  No clavicle ttp or palpable deformity.  Neurological: She is alert. She has normal strength. She displays normal reflexes. No sensory deficit.  Skin: Skin is warm and dry.  Skin intact.  Psychiatric: She has a normal mood and affect.    ED Course  Procedures (including critical care time) Labs Review Labs Reviewed - No data to display  Imaging Review Dg Humerus Left  12/12/2015  CLINICAL DATA:  Pt states she fell down 2 stairs and fell on her left arm. Pt c/o upper left arm pain. EXAM: LEFT HUMERUS - 2+ VIEW COMPARISON:  None. FINDINGS: Complex oblique/ spiral fractures through the LEFT proximal humeral diaphysis and metaphysis. There is mild angulation at the mid diaphyseal level. IMPRESSION: Complex minimally displaced spiral oblique fracture through the proximal humerus. Caps Electronically Signed   By: Suzy Bouchard M.D.   On: 12/12/2015 23:30   I have personally reviewed and evaluated these images and lab results as part of my medical decision-making.   EKG Interpretation None      MDM   Final diagnoses:  Humerus fracture, left, closed, initial encounter     Radiological studies were viewed, interpreted and considered during the medical decision making and disposition process. I agree with radiologists reading.  Results were also discussed with  patient.  Discussed with Dr. Doran Durand.  Advised coaptation splint, sling and swath, sleep in upright position. Call for office f/u on Monday am.  She was given fentanyl IV with transient mild improvement in pain.  She was given morphine 4 mg and valium 2 mg IV prior to splint placement which improved pain and spasm.  Post splint application recheck - pain improved, radial pulse intact.  Distal sensation normal, no further numbness in the fingers. Less than 2 sec cap refill in finger tips.  Will prescribe oxycodone and ativan (prn muscle spasm) given shorter duration with multiple sedating meds.  Evalee Jefferson, PA-C 12/13/15 0222  Merrily Pew, MD 12/14/15 [phone]

## 2015-12-13 NOTE — Discharge Instructions (Signed)
Cast or Splint Care Casts and splints support injured limbs and keep bones from moving while they heal. It is important to care for your cast or splint at home.  HOME CARE INSTRUCTIONS  Keep the cast or splint uncovered during the drying period. It can take 24 to 48 hours to dry if it is made of plaster. A fiberglass cast will dry in less than 1 hour.  Do not rest the cast on anything harder than a pillow for the first 24 hours.  Do not put weight on your injured limb or apply pressure to the cast until your health care provider gives you permission.  Keep the cast or splint dry. Wet casts or splints can lose their shape and may not support the limb as well. A wet cast that has lost its shape can also create harmful pressure on your skin when it dries. Also, wet skin can become infected.  Cover the cast or splint with a plastic bag when bathing or when out in the rain or snow. If the cast is on the trunk of the body, take sponge baths until the cast is removed.  If your cast does become wet, dry it with a towel or a blow dryer on the cool setting only.  Keep your cast or splint clean. Soiled casts may be wiped with a moistened cloth.  Do not place any hard or soft foreign objects under your cast or splint, such as cotton, toilet paper, lotion, or powder.  Do not try to scratch the skin under the cast with any object. The object could get stuck inside the cast. Also, scratching could lead to an infection. If itching is a problem, use a blow dryer on a cool setting to relieve discomfort.  Do not trim or cut your cast or remove padding from inside of it.  Exercise all joints next to the injury that are not immobilized by the cast or splint. For example, if you have a long leg cast, exercise the hip joint and toes. If you have an arm cast or splint, exercise the shoulder, elbow, thumb, and fingers.  Elevate your injured arm or leg on 1 or 2 pillows for the first 1 to 3 days to decrease  swelling and pain.It is best if you can comfortably elevate your cast so it is higher than your heart. SEEK MEDICAL CARE IF:   Your cast or splint cracks.  Your cast or splint is too tight or too loose.  You have unbearable itching inside the cast.  Your cast becomes wet or develops a soft spot or area.  You have a bad smell coming from inside your cast.  You get an object stuck under your cast.  Your skin around the cast becomes red or raw.  You have new pain or worsening pain after the cast has been applied. SEEK IMMEDIATE MEDICAL CARE IF:   You have fluid leaking through the cast.  You are unable to move your fingers or toes.  You have discolored (blue or white), cool, painful, or very swollen fingers or toes beyond the cast.  You have tingling or numbness around the injured area.  You have severe pain or pressure under the cast.  You have any difficulty with your breathing or have shortness of breath.  You have chest pain.   This information is not intended to replace advice given to you by your health care provider. Make sure you discuss any questions you have with your health care  provider.   Document Released: 05/21/2000 Document Revised: 03/14/2013 Document Reviewed: 11/30/2012 Elsevier Interactive Patient Education 2016 Elsevier Inc.  Humerus Fracture Treated With Immobilization The humerus is the large bone in your upper arm. You have a broken (fractured) humerus. These fractures are easily diagnosed with X-rays. TREATMENT  Simple fractures which will heal without disability are treated with simple immobilization. Immobilization means you will wear a cast, splint, or sling. You have a fracture which will do well with immobilization. The fracture will heal well simply by being held in a good position until it is stable enough to begin range of motion exercises. Do not take part in activities which would further injure your arm.  HOME CARE INSTRUCTIONS   Put ice  on the injured area.  Put ice in a plastic bag.  Place a towel between your skin and the bag.  Leave the ice on for 15-20 minutes, 03-04 times a day.  If you have a cast:  Do not scratch the skin under the cast using sharp or pointed objects.  Check the skin around the cast every day. You may put lotion on any red or sore areas.  Keep your cast dry and clean.  If you have a splint:  Wear the splint as directed.  Keep your splint dry and clean.  You may loosen the elastic around the splint if your fingers become numb, tingle, or turn cold or blue.  If you have a sling:  Wear the sling as directed.  Do not put pressure on any part of your cast or splint until it is fully hardened.  Your cast or splint can be protected during bathing with a plastic bag. Do not lower the cast or splint into water.  Only take over-the-counter or prescription medicines for pain, discomfort, or fever as directed by your caregiver.  Do range of motion exercises as instructed by your caregiver.  Follow up as directed by your caregiver. This is very important in order to avoid permanent injury or disability and chronic pain. SEEK IMMEDIATE MEDICAL CARE IF:   Your skin or nails in the injured arm turn blue or gray.  Your arm feels cold or numb.  You develop severe pain in the injured arm.  You are having problems with the medicines you were given. MAKE SURE YOU:   Understand these instructions.  Will watch your condition.  Will get help right away if you are not doing well or get worse.   This information is not intended to replace advice given to you by your health care provider. Make sure you discuss any questions you have with your health care provider.   Document Released: 08/30/2000 Document Revised: 06/14/2014 Document Reviewed: 10/16/2014 Elsevier Interactive Patient Education [employer].   Use caution with the medicines prescribed as they will make you sleepy.  You may  take the ativan which can help with muscle spasm. Additionally this medicine can help with the itching side effect you have with oxycodone.

## 2015-12-13 NOTE — ED Notes (Signed)
Discharge instructions reviewed, patient walked to car without issue, states only painful when moving.

## 2016-05-11 DIAGNOSIS — E669 Obesity, unspecified: Secondary | ICD-10-CM | POA: Diagnosis not present

## 2016-05-11 DIAGNOSIS — Z6834 Body mass index (BMI) 34.0-34.9, adult: Secondary | ICD-10-CM | POA: Diagnosis not present

## 2016-05-18 DIAGNOSIS — S42292D Other displaced fracture of upper end of left humerus, subsequent encounter for fracture with routine healing: Secondary | ICD-10-CM | POA: Diagnosis not present

## 2016-06-15 DIAGNOSIS — E559 Vitamin D deficiency, unspecified: Secondary | ICD-10-CM | POA: Diagnosis not present

## 2016-06-15 DIAGNOSIS — E669 Obesity, unspecified: Secondary | ICD-10-CM | POA: Diagnosis not present

## 2016-06-15 DIAGNOSIS — Z6835 Body mass index (BMI) 35.0-35.9, adult: Secondary | ICD-10-CM | POA: Diagnosis not present

## 2016-07-13 DIAGNOSIS — I10 Essential (primary) hypertension: Secondary | ICD-10-CM | POA: Diagnosis not present

## 2016-07-13 DIAGNOSIS — D649 Anemia, unspecified: Secondary | ICD-10-CM | POA: Diagnosis not present

## 2016-07-13 DIAGNOSIS — Z79899 Other long term (current) drug therapy: Secondary | ICD-10-CM | POA: Diagnosis not present

## 2016-07-13 DIAGNOSIS — E1140 Type 2 diabetes mellitus with diabetic neuropathy, unspecified: Secondary | ICD-10-CM | POA: Diagnosis not present

## 2016-07-13 DIAGNOSIS — E782 Mixed hyperlipidemia: Secondary | ICD-10-CM | POA: Diagnosis not present

## 2016-07-13 DIAGNOSIS — Z139 Encounter for screening, unspecified: Secondary | ICD-10-CM | POA: Diagnosis not present

## 2016-07-13 DIAGNOSIS — E559 Vitamin D deficiency, unspecified: Secondary | ICD-10-CM | POA: Diagnosis not present

## 2016-07-15 DIAGNOSIS — E669 Obesity, unspecified: Secondary | ICD-10-CM | POA: Diagnosis not present

## 2016-07-20 DIAGNOSIS — I10 Essential (primary) hypertension: Secondary | ICD-10-CM | POA: Diagnosis not present

## 2016-07-20 DIAGNOSIS — E1140 Type 2 diabetes mellitus with diabetic neuropathy, unspecified: Secondary | ICD-10-CM | POA: Diagnosis not present

## 2016-07-20 DIAGNOSIS — E559 Vitamin D deficiency, unspecified: Secondary | ICD-10-CM | POA: Diagnosis not present

## 2016-07-20 DIAGNOSIS — E782 Mixed hyperlipidemia: Secondary | ICD-10-CM | POA: Diagnosis not present

## 2016-07-22 DIAGNOSIS — E119 Type 2 diabetes mellitus without complications: Secondary | ICD-10-CM | POA: Diagnosis not present

## 2016-07-22 DIAGNOSIS — G4730 Sleep apnea, unspecified: Secondary | ICD-10-CM | POA: Diagnosis not present

## 2016-08-10 DIAGNOSIS — Z6836 Body mass index (BMI) 36.0-36.9, adult: Secondary | ICD-10-CM | POA: Diagnosis not present

## 2016-08-10 DIAGNOSIS — E11319 Type 2 diabetes mellitus with unspecified diabetic retinopathy without macular edema: Secondary | ICD-10-CM | POA: Diagnosis not present

## 2016-08-10 DIAGNOSIS — E669 Obesity, unspecified: Secondary | ICD-10-CM | POA: Diagnosis not present

## 2016-08-10 DIAGNOSIS — Z713 Dietary counseling and surveillance: Secondary | ICD-10-CM | POA: Diagnosis not present

## 2016-08-24 DIAGNOSIS — Z6836 Body mass index (BMI) 36.0-36.9, adult: Secondary | ICD-10-CM | POA: Diagnosis not present

## 2016-08-24 DIAGNOSIS — Z713 Dietary counseling and surveillance: Secondary | ICD-10-CM | POA: Diagnosis not present

## 2016-08-24 DIAGNOSIS — E669 Obesity, unspecified: Secondary | ICD-10-CM | POA: Diagnosis not present

## 2016-08-31 DIAGNOSIS — Z713 Dietary counseling and surveillance: Secondary | ICD-10-CM | POA: Diagnosis not present

## 2016-08-31 DIAGNOSIS — E669 Obesity, unspecified: Secondary | ICD-10-CM | POA: Diagnosis not present

## 2016-08-31 DIAGNOSIS — Z6836 Body mass index (BMI) 36.0-36.9, adult: Secondary | ICD-10-CM | POA: Diagnosis not present

## 2016-09-07 DIAGNOSIS — Z719 Counseling, unspecified: Secondary | ICD-10-CM | POA: Diagnosis not present

## 2016-09-07 DIAGNOSIS — E669 Obesity, unspecified: Secondary | ICD-10-CM | POA: Diagnosis not present

## 2016-09-07 DIAGNOSIS — E559 Vitamin D deficiency, unspecified: Secondary | ICD-10-CM | POA: Diagnosis not present

## 2016-09-07 DIAGNOSIS — E1140 Type 2 diabetes mellitus with diabetic neuropathy, unspecified: Secondary | ICD-10-CM | POA: Diagnosis not present

## 2016-09-07 DIAGNOSIS — E782 Mixed hyperlipidemia: Secondary | ICD-10-CM | POA: Diagnosis not present

## 2016-09-07 DIAGNOSIS — Z6836 Body mass index (BMI) 36.0-36.9, adult: Secondary | ICD-10-CM | POA: Diagnosis not present

## 2016-09-07 DIAGNOSIS — Z008 Encounter for other general examination: Secondary | ICD-10-CM | POA: Diagnosis not present

## 2016-09-14 DIAGNOSIS — E669 Obesity, unspecified: Secondary | ICD-10-CM | POA: Diagnosis not present

## 2016-09-14 DIAGNOSIS — Z6837 Body mass index (BMI) 37.0-37.9, adult: Secondary | ICD-10-CM | POA: Diagnosis not present

## 2016-09-14 DIAGNOSIS — Z713 Dietary counseling and surveillance: Secondary | ICD-10-CM | POA: Diagnosis not present

## 2016-10-05 DIAGNOSIS — Z6837 Body mass index (BMI) 37.0-37.9, adult: Secondary | ICD-10-CM | POA: Diagnosis not present

## 2016-10-05 DIAGNOSIS — E669 Obesity, unspecified: Secondary | ICD-10-CM | POA: Diagnosis not present

## 2016-10-05 DIAGNOSIS — Z713 Dietary counseling and surveillance: Secondary | ICD-10-CM | POA: Diagnosis not present

## 2016-10-21 DIAGNOSIS — Z713 Dietary counseling and surveillance: Secondary | ICD-10-CM | POA: Diagnosis not present

## 2016-10-21 DIAGNOSIS — Z6837 Body mass index (BMI) 37.0-37.9, adult: Secondary | ICD-10-CM | POA: Diagnosis not present

## 2016-10-21 DIAGNOSIS — E669 Obesity, unspecified: Secondary | ICD-10-CM | POA: Diagnosis not present

## 2016-11-17 DIAGNOSIS — Z1231 Encounter for screening mammogram for malignant neoplasm of breast: Secondary | ICD-10-CM | POA: Diagnosis not present

## 2016-11-23 DIAGNOSIS — Z6837 Body mass index (BMI) 37.0-37.9, adult: Secondary | ICD-10-CM | POA: Diagnosis not present

## 2016-11-23 DIAGNOSIS — E669 Obesity, unspecified: Secondary | ICD-10-CM | POA: Diagnosis not present

## 2016-11-23 DIAGNOSIS — Z713 Dietary counseling and surveillance: Secondary | ICD-10-CM | POA: Diagnosis not present

## 2016-11-30 DIAGNOSIS — Z79899 Other long term (current) drug therapy: Secondary | ICD-10-CM | POA: Diagnosis not present

## 2016-11-30 DIAGNOSIS — E559 Vitamin D deficiency, unspecified: Secondary | ICD-10-CM | POA: Diagnosis not present

## 2016-11-30 DIAGNOSIS — E782 Mixed hyperlipidemia: Secondary | ICD-10-CM | POA: Diagnosis not present

## 2016-11-30 DIAGNOSIS — E1140 Type 2 diabetes mellitus with diabetic neuropathy, unspecified: Secondary | ICD-10-CM | POA: Diagnosis not present

## 2017-02-10 DIAGNOSIS — Z713 Dietary counseling and surveillance: Secondary | ICD-10-CM | POA: Diagnosis not present

## 2017-02-10 DIAGNOSIS — E1140 Type 2 diabetes mellitus with diabetic neuropathy, unspecified: Secondary | ICD-10-CM | POA: Diagnosis not present

## 2017-02-10 DIAGNOSIS — Z008 Encounter for other general examination: Secondary | ICD-10-CM | POA: Diagnosis not present

## 2017-02-10 DIAGNOSIS — Z719 Counseling, unspecified: Secondary | ICD-10-CM | POA: Diagnosis not present

## 2017-02-10 DIAGNOSIS — M2550 Pain in unspecified joint: Secondary | ICD-10-CM | POA: Diagnosis not present

## 2017-02-10 DIAGNOSIS — E559 Vitamin D deficiency, unspecified: Secondary | ICD-10-CM | POA: Diagnosis not present

## 2017-02-15 DIAGNOSIS — D649 Anemia, unspecified: Secondary | ICD-10-CM | POA: Diagnosis not present

## 2017-02-15 DIAGNOSIS — Z139 Encounter for screening, unspecified: Secondary | ICD-10-CM | POA: Diagnosis not present

## 2017-02-15 DIAGNOSIS — E559 Vitamin D deficiency, unspecified: Secondary | ICD-10-CM | POA: Diagnosis not present

## 2017-02-15 DIAGNOSIS — E782 Mixed hyperlipidemia: Secondary | ICD-10-CM | POA: Diagnosis not present

## 2017-02-15 DIAGNOSIS — E1140 Type 2 diabetes mellitus with diabetic neuropathy, unspecified: Secondary | ICD-10-CM | POA: Diagnosis not present

## 2017-02-15 DIAGNOSIS — Z79899 Other long term (current) drug therapy: Secondary | ICD-10-CM | POA: Diagnosis not present

## 2017-03-01 DIAGNOSIS — E1140 Type 2 diabetes mellitus with diabetic neuropathy, unspecified: Secondary | ICD-10-CM | POA: Diagnosis not present

## 2017-03-01 DIAGNOSIS — E782 Mixed hyperlipidemia: Secondary | ICD-10-CM | POA: Diagnosis not present

## 2017-03-01 DIAGNOSIS — Z79899 Other long term (current) drug therapy: Secondary | ICD-10-CM | POA: Diagnosis not present

## 2017-03-01 DIAGNOSIS — E559 Vitamin D deficiency, unspecified: Secondary | ICD-10-CM | POA: Diagnosis not present

## 2017-03-17 DIAGNOSIS — E782 Mixed hyperlipidemia: Secondary | ICD-10-CM | POA: Diagnosis not present

## 2017-03-17 DIAGNOSIS — E1140 Type 2 diabetes mellitus with diabetic neuropathy, unspecified: Secondary | ICD-10-CM | POA: Diagnosis not present

## 2017-03-17 DIAGNOSIS — E559 Vitamin D deficiency, unspecified: Secondary | ICD-10-CM | POA: Diagnosis not present

## 2017-03-17 DIAGNOSIS — E669 Obesity, unspecified: Secondary | ICD-10-CM | POA: Diagnosis not present

## 2017-03-17 DIAGNOSIS — Z008 Encounter for other general examination: Secondary | ICD-10-CM | POA: Diagnosis not present

## 2017-04-12 DIAGNOSIS — E559 Vitamin D deficiency, unspecified: Secondary | ICD-10-CM | POA: Diagnosis not present

## 2017-04-12 DIAGNOSIS — Z79899 Other long term (current) drug therapy: Secondary | ICD-10-CM | POA: Diagnosis not present

## 2017-04-12 DIAGNOSIS — E1140 Type 2 diabetes mellitus with diabetic neuropathy, unspecified: Secondary | ICD-10-CM | POA: Diagnosis not present

## 2017-04-12 DIAGNOSIS — E782 Mixed hyperlipidemia: Secondary | ICD-10-CM | POA: Diagnosis not present

## 2017-04-26 DIAGNOSIS — E559 Vitamin D deficiency, unspecified: Secondary | ICD-10-CM | POA: Diagnosis not present

## 2017-04-26 DIAGNOSIS — E669 Obesity, unspecified: Secondary | ICD-10-CM | POA: Diagnosis not present

## 2017-04-26 DIAGNOSIS — E1140 Type 2 diabetes mellitus with diabetic neuropathy, unspecified: Secondary | ICD-10-CM | POA: Diagnosis not present

## 2017-04-26 DIAGNOSIS — E782 Mixed hyperlipidemia: Secondary | ICD-10-CM | POA: Diagnosis not present

## 2017-05-17 DIAGNOSIS — K219 Gastro-esophageal reflux disease without esophagitis: Secondary | ICD-10-CM | POA: Diagnosis not present

## 2017-05-17 DIAGNOSIS — E1140 Type 2 diabetes mellitus with diabetic neuropathy, unspecified: Secondary | ICD-10-CM | POA: Diagnosis not present

## 2017-05-17 DIAGNOSIS — E669 Obesity, unspecified: Secondary | ICD-10-CM | POA: Diagnosis not present

## 2017-05-17 DIAGNOSIS — Z6838 Body mass index (BMI) 38.0-38.9, adult: Secondary | ICD-10-CM | POA: Diagnosis not present

## 2017-06-14 DIAGNOSIS — Z719 Counseling, unspecified: Secondary | ICD-10-CM | POA: Diagnosis not present

## 2017-06-14 DIAGNOSIS — E1140 Type 2 diabetes mellitus with diabetic neuropathy, unspecified: Secondary | ICD-10-CM | POA: Diagnosis not present

## 2017-06-14 DIAGNOSIS — E559 Vitamin D deficiency, unspecified: Secondary | ICD-10-CM | POA: Diagnosis not present

## 2017-06-14 DIAGNOSIS — E782 Mixed hyperlipidemia: Secondary | ICD-10-CM | POA: Diagnosis not present

## 2017-06-14 DIAGNOSIS — Z008 Encounter for other general examination: Secondary | ICD-10-CM | POA: Diagnosis not present

## 2017-07-05 DIAGNOSIS — E1140 Type 2 diabetes mellitus with diabetic neuropathy, unspecified: Secondary | ICD-10-CM | POA: Diagnosis not present

## 2017-07-05 DIAGNOSIS — M2550 Pain in unspecified joint: Secondary | ICD-10-CM | POA: Diagnosis not present

## 2017-07-19 DIAGNOSIS — E1140 Type 2 diabetes mellitus with diabetic neuropathy, unspecified: Secondary | ICD-10-CM | POA: Diagnosis not present

## 2017-08-09 DIAGNOSIS — E1140 Type 2 diabetes mellitus with diabetic neuropathy, unspecified: Secondary | ICD-10-CM | POA: Diagnosis not present

## 2017-08-09 DIAGNOSIS — F331 Major depressive disorder, recurrent, moderate: Secondary | ICD-10-CM | POA: Diagnosis not present

## 2017-08-09 DIAGNOSIS — E782 Mixed hyperlipidemia: Secondary | ICD-10-CM | POA: Diagnosis not present

## 2017-08-16 DIAGNOSIS — Z139 Encounter for screening, unspecified: Secondary | ICD-10-CM | POA: Diagnosis not present

## 2017-08-16 DIAGNOSIS — E559 Vitamin D deficiency, unspecified: Secondary | ICD-10-CM | POA: Diagnosis not present

## 2017-08-16 DIAGNOSIS — E1140 Type 2 diabetes mellitus with diabetic neuropathy, unspecified: Secondary | ICD-10-CM | POA: Diagnosis not present

## 2017-08-16 DIAGNOSIS — E782 Mixed hyperlipidemia: Secondary | ICD-10-CM | POA: Diagnosis not present

## 2017-08-16 DIAGNOSIS — Z79899 Other long term (current) drug therapy: Secondary | ICD-10-CM | POA: Diagnosis not present

## 2017-11-16 DIAGNOSIS — Z1231 Encounter for screening mammogram for malignant neoplasm of breast: Secondary | ICD-10-CM | POA: Diagnosis not present

## 2017-11-22 DIAGNOSIS — E1140 Type 2 diabetes mellitus with diabetic neuropathy, unspecified: Secondary | ICD-10-CM | POA: Diagnosis not present

## 2017-11-22 DIAGNOSIS — Z008 Encounter for other general examination: Secondary | ICD-10-CM | POA: Diagnosis not present

## 2017-11-22 DIAGNOSIS — F331 Major depressive disorder, recurrent, moderate: Secondary | ICD-10-CM | POA: Diagnosis not present

## 2017-11-22 DIAGNOSIS — I10 Essential (primary) hypertension: Secondary | ICD-10-CM | POA: Diagnosis not present

## 2017-11-22 DIAGNOSIS — Z719 Counseling, unspecified: Secondary | ICD-10-CM | POA: Diagnosis not present

## 2017-11-29 DIAGNOSIS — D649 Anemia, unspecified: Secondary | ICD-10-CM | POA: Diagnosis not present

## 2017-11-29 DIAGNOSIS — E559 Vitamin D deficiency, unspecified: Secondary | ICD-10-CM | POA: Diagnosis not present

## 2017-11-29 DIAGNOSIS — Z79899 Other long term (current) drug therapy: Secondary | ICD-10-CM | POA: Diagnosis not present

## 2017-11-29 DIAGNOSIS — E1140 Type 2 diabetes mellitus with diabetic neuropathy, unspecified: Secondary | ICD-10-CM | POA: Diagnosis not present

## 2017-11-29 DIAGNOSIS — E782 Mixed hyperlipidemia: Secondary | ICD-10-CM | POA: Diagnosis not present

## 2017-12-13 DIAGNOSIS — I10 Essential (primary) hypertension: Secondary | ICD-10-CM | POA: Diagnosis not present

## 2017-12-13 DIAGNOSIS — F331 Major depressive disorder, recurrent, moderate: Secondary | ICD-10-CM | POA: Diagnosis not present

## 2017-12-13 DIAGNOSIS — Z719 Counseling, unspecified: Secondary | ICD-10-CM | POA: Diagnosis not present

## 2017-12-13 DIAGNOSIS — Z008 Encounter for other general examination: Secondary | ICD-10-CM | POA: Diagnosis not present

## 2018-01-17 DIAGNOSIS — Z833 Family history of diabetes mellitus: Secondary | ICD-10-CM | POA: Diagnosis not present

## 2018-01-17 DIAGNOSIS — E1165 Type 2 diabetes mellitus with hyperglycemia: Secondary | ICD-10-CM | POA: Diagnosis not present

## 2018-01-17 DIAGNOSIS — E669 Obesity, unspecified: Secondary | ICD-10-CM | POA: Diagnosis not present

## 2018-02-07 DIAGNOSIS — H1031 Unspecified acute conjunctivitis, right eye: Secondary | ICD-10-CM | POA: Diagnosis not present

## 2018-02-21 DIAGNOSIS — E782 Mixed hyperlipidemia: Secondary | ICD-10-CM | POA: Diagnosis not present

## 2018-02-21 DIAGNOSIS — Z79899 Other long term (current) drug therapy: Secondary | ICD-10-CM | POA: Diagnosis not present

## 2018-02-21 DIAGNOSIS — M2550 Pain in unspecified joint: Secondary | ICD-10-CM | POA: Diagnosis not present

## 2018-02-21 DIAGNOSIS — E1140 Type 2 diabetes mellitus with diabetic neuropathy, unspecified: Secondary | ICD-10-CM | POA: Diagnosis not present

## 2018-02-21 DIAGNOSIS — D649 Anemia, unspecified: Secondary | ICD-10-CM | POA: Diagnosis not present

## 2018-02-21 DIAGNOSIS — Z139 Encounter for screening, unspecified: Secondary | ICD-10-CM | POA: Diagnosis not present

## 2018-02-21 DIAGNOSIS — E559 Vitamin D deficiency, unspecified: Secondary | ICD-10-CM | POA: Diagnosis not present

## 2018-03-07 DIAGNOSIS — Z719 Counseling, unspecified: Secondary | ICD-10-CM | POA: Diagnosis not present

## 2018-03-07 DIAGNOSIS — I10 Essential (primary) hypertension: Secondary | ICD-10-CM | POA: Diagnosis not present

## 2018-03-07 DIAGNOSIS — F331 Major depressive disorder, recurrent, moderate: Secondary | ICD-10-CM | POA: Diagnosis not present

## 2018-03-07 DIAGNOSIS — Z23 Encounter for immunization: Secondary | ICD-10-CM | POA: Diagnosis not present

## 2018-03-07 DIAGNOSIS — Z008 Encounter for other general examination: Secondary | ICD-10-CM | POA: Diagnosis not present

## 2018-03-27 DIAGNOSIS — E1165 Type 2 diabetes mellitus with hyperglycemia: Secondary | ICD-10-CM | POA: Diagnosis not present

## 2018-03-27 DIAGNOSIS — Z833 Family history of diabetes mellitus: Secondary | ICD-10-CM | POA: Diagnosis not present

## 2018-03-27 DIAGNOSIS — E669 Obesity, unspecified: Secondary | ICD-10-CM | POA: Diagnosis not present

## 2018-04-11 DIAGNOSIS — Z6836 Body mass index (BMI) 36.0-36.9, adult: Secondary | ICD-10-CM | POA: Diagnosis not present

## 2018-04-11 DIAGNOSIS — E669 Obesity, unspecified: Secondary | ICD-10-CM | POA: Diagnosis not present

## 2018-04-11 DIAGNOSIS — E1140 Type 2 diabetes mellitus with diabetic neuropathy, unspecified: Secondary | ICD-10-CM | POA: Diagnosis not present

## 2018-04-11 DIAGNOSIS — I10 Essential (primary) hypertension: Secondary | ICD-10-CM | POA: Diagnosis not present

## 2018-05-09 DIAGNOSIS — E782 Mixed hyperlipidemia: Secondary | ICD-10-CM | POA: Diagnosis not present

## 2018-05-09 DIAGNOSIS — E1140 Type 2 diabetes mellitus with diabetic neuropathy, unspecified: Secondary | ICD-10-CM | POA: Diagnosis not present

## 2018-05-09 DIAGNOSIS — Z79899 Other long term (current) drug therapy: Secondary | ICD-10-CM | POA: Diagnosis not present

## 2018-05-09 DIAGNOSIS — E559 Vitamin D deficiency, unspecified: Secondary | ICD-10-CM | POA: Diagnosis not present

## 2018-05-09 DIAGNOSIS — Z139 Encounter for screening, unspecified: Secondary | ICD-10-CM | POA: Diagnosis not present

## 2018-05-23 DIAGNOSIS — E559 Vitamin D deficiency, unspecified: Secondary | ICD-10-CM | POA: Diagnosis not present

## 2018-05-23 DIAGNOSIS — E1140 Type 2 diabetes mellitus with diabetic neuropathy, unspecified: Secondary | ICD-10-CM | POA: Diagnosis not present

## 2018-05-23 DIAGNOSIS — E782 Mixed hyperlipidemia: Secondary | ICD-10-CM | POA: Diagnosis not present

## 2018-05-23 DIAGNOSIS — Z79899 Other long term (current) drug therapy: Secondary | ICD-10-CM | POA: Diagnosis not present

## 2018-06-13 DIAGNOSIS — E782 Mixed hyperlipidemia: Secondary | ICD-10-CM | POA: Diagnosis not present

## 2018-06-13 DIAGNOSIS — Z719 Counseling, unspecified: Secondary | ICD-10-CM | POA: Diagnosis not present

## 2018-06-13 DIAGNOSIS — E1140 Type 2 diabetes mellitus with diabetic neuropathy, unspecified: Secondary | ICD-10-CM | POA: Diagnosis not present

## 2018-07-04 DIAGNOSIS — E669 Obesity, unspecified: Secondary | ICD-10-CM | POA: Diagnosis not present

## 2018-07-04 DIAGNOSIS — E1165 Type 2 diabetes mellitus with hyperglycemia: Secondary | ICD-10-CM | POA: Diagnosis not present

## 2018-07-04 DIAGNOSIS — Z833 Family history of diabetes mellitus: Secondary | ICD-10-CM | POA: Diagnosis not present

## 2018-07-12 DIAGNOSIS — Z6837 Body mass index (BMI) 37.0-37.9, adult: Secondary | ICD-10-CM | POA: Diagnosis not present

## 2018-07-12 DIAGNOSIS — Z0000 Encounter for general adult medical examination without abnormal findings: Secondary | ICD-10-CM | POA: Diagnosis not present

## 2018-07-12 DIAGNOSIS — Z23 Encounter for immunization: Secondary | ICD-10-CM | POA: Diagnosis not present

## 2018-08-01 DIAGNOSIS — I10 Essential (primary) hypertension: Secondary | ICD-10-CM | POA: Diagnosis not present

## 2018-08-01 DIAGNOSIS — Z008 Encounter for other general examination: Secondary | ICD-10-CM | POA: Diagnosis not present

## 2018-08-01 DIAGNOSIS — E782 Mixed hyperlipidemia: Secondary | ICD-10-CM | POA: Diagnosis not present

## 2018-08-01 DIAGNOSIS — E1140 Type 2 diabetes mellitus with diabetic neuropathy, unspecified: Secondary | ICD-10-CM | POA: Diagnosis not present

## 2018-08-04 LAB — FECAL DNA COLORECTAL CANCER SCREENING (COLOGUARD): FIT-DNA (Cologuard): NEGATIVE

## 2018-10-09 DIAGNOSIS — E1165 Type 2 diabetes mellitus with hyperglycemia: Secondary | ICD-10-CM | POA: Diagnosis not present

## 2018-10-09 DIAGNOSIS — Z833 Family history of diabetes mellitus: Secondary | ICD-10-CM | POA: Diagnosis not present

## 2018-10-09 DIAGNOSIS — E669 Obesity, unspecified: Secondary | ICD-10-CM | POA: Diagnosis not present

## 2018-10-19 DIAGNOSIS — R2231 Localized swelling, mass and lump, right upper limb: Secondary | ICD-10-CM | POA: Diagnosis not present

## 2018-10-31 DIAGNOSIS — D649 Anemia, unspecified: Secondary | ICD-10-CM | POA: Diagnosis not present

## 2018-10-31 DIAGNOSIS — Z79899 Other long term (current) drug therapy: Secondary | ICD-10-CM | POA: Diagnosis not present

## 2018-10-31 DIAGNOSIS — E1140 Type 2 diabetes mellitus with diabetic neuropathy, unspecified: Secondary | ICD-10-CM | POA: Diagnosis not present

## 2018-10-31 DIAGNOSIS — E559 Vitamin D deficiency, unspecified: Secondary | ICD-10-CM | POA: Diagnosis not present

## 2018-10-31 DIAGNOSIS — E782 Mixed hyperlipidemia: Secondary | ICD-10-CM | POA: Diagnosis not present

## 2018-10-31 DIAGNOSIS — F331 Major depressive disorder, recurrent, moderate: Secondary | ICD-10-CM | POA: Diagnosis not present

## 2018-11-08 DIAGNOSIS — M79672 Pain in left foot: Secondary | ICD-10-CM | POA: Diagnosis not present

## 2018-11-08 DIAGNOSIS — M25572 Pain in left ankle and joints of left foot: Secondary | ICD-10-CM | POA: Diagnosis not present

## 2018-11-29 DIAGNOSIS — M25572 Pain in left ankle and joints of left foot: Secondary | ICD-10-CM | POA: Diagnosis not present

## 2018-11-29 DIAGNOSIS — M79672 Pain in left foot: Secondary | ICD-10-CM | POA: Diagnosis not present

## 2018-12-26 DIAGNOSIS — E782 Mixed hyperlipidemia: Secondary | ICD-10-CM | POA: Diagnosis not present

## 2018-12-26 DIAGNOSIS — E538 Deficiency of other specified B group vitamins: Secondary | ICD-10-CM | POA: Diagnosis not present

## 2018-12-26 DIAGNOSIS — E1140 Type 2 diabetes mellitus with diabetic neuropathy, unspecified: Secondary | ICD-10-CM | POA: Diagnosis not present

## 2018-12-26 DIAGNOSIS — D649 Anemia, unspecified: Secondary | ICD-10-CM | POA: Diagnosis not present

## 2018-12-26 DIAGNOSIS — Z79899 Other long term (current) drug therapy: Secondary | ICD-10-CM | POA: Diagnosis not present

## 2018-12-26 DIAGNOSIS — E559 Vitamin D deficiency, unspecified: Secondary | ICD-10-CM | POA: Diagnosis not present

## 2018-12-26 DIAGNOSIS — Z139 Encounter for screening, unspecified: Secondary | ICD-10-CM | POA: Diagnosis not present

## 2018-12-26 DIAGNOSIS — I10 Essential (primary) hypertension: Secondary | ICD-10-CM | POA: Diagnosis not present

## 2018-12-27 ENCOUNTER — Encounter: Payer: Self-pay | Admitting: *Deleted

## 2018-12-28 DIAGNOSIS — Z1231 Encounter for screening mammogram for malignant neoplasm of breast: Secondary | ICD-10-CM | POA: Diagnosis not present

## 2019-01-15 DIAGNOSIS — Z833 Family history of diabetes mellitus: Secondary | ICD-10-CM | POA: Diagnosis not present

## 2019-01-15 DIAGNOSIS — E1165 Type 2 diabetes mellitus with hyperglycemia: Secondary | ICD-10-CM | POA: Diagnosis not present

## 2019-01-15 DIAGNOSIS — E669 Obesity, unspecified: Secondary | ICD-10-CM | POA: Diagnosis not present

## 2019-02-06 ENCOUNTER — Other Ambulatory Visit (HOSPITAL_COMMUNITY): Payer: Self-pay | Admitting: Family Medicine

## 2019-02-06 DIAGNOSIS — M13841 Other specified arthritis, right hand: Secondary | ICD-10-CM | POA: Diagnosis not present

## 2019-02-06 DIAGNOSIS — N63 Unspecified lump in unspecified breast: Secondary | ICD-10-CM

## 2019-02-14 DIAGNOSIS — M25572 Pain in left ankle and joints of left foot: Secondary | ICD-10-CM | POA: Diagnosis not present

## 2019-02-16 ENCOUNTER — Other Ambulatory Visit (HOSPITAL_COMMUNITY): Payer: BLUE CROSS/BLUE SHIELD

## 2019-02-16 ENCOUNTER — Other Ambulatory Visit: Payer: Self-pay

## 2019-02-16 ENCOUNTER — Ambulatory Visit (HOSPITAL_COMMUNITY)
Admission: RE | Admit: 2019-02-16 | Discharge: 2019-02-16 | Disposition: A | Discharge status: Home / Self Care | Payer: BC Managed Care – PPO | Source: Ambulatory Visit | Attending: Family Medicine | Admitting: Family Medicine

## 2019-02-16 DIAGNOSIS — N63 Unspecified lump in unspecified breast: Secondary | ICD-10-CM | POA: Diagnosis not present

## 2019-02-16 DIAGNOSIS — R921 Mammographic calcification found on diagnostic imaging of breast: Secondary | ICD-10-CM | POA: Diagnosis not present

## 2019-02-16 DIAGNOSIS — R922 Inconclusive mammogram: Secondary | ICD-10-CM | POA: Diagnosis not present

## 2019-02-19 ENCOUNTER — Other Ambulatory Visit (HOSPITAL_COMMUNITY): Payer: Self-pay | Admitting: Family Medicine

## 2019-02-19 ENCOUNTER — Other Ambulatory Visit (HOSPITAL_COMMUNITY): Payer: Self-pay | Admitting: Physician Assistant

## 2019-02-19 DIAGNOSIS — R928 Other abnormal and inconclusive findings on diagnostic imaging of breast: Secondary | ICD-10-CM

## 2019-02-20 ENCOUNTER — Ambulatory Visit (HOSPITAL_COMMUNITY)
Admission: RE | Admit: 2019-02-20 | Discharge: 2019-02-20 | Disposition: A | Discharge status: Home / Self Care | Payer: BC Managed Care – PPO | Source: Ambulatory Visit | Attending: Physician Assistant | Admitting: Physician Assistant

## 2019-02-20 ENCOUNTER — Other Ambulatory Visit: Payer: Self-pay

## 2019-02-20 ENCOUNTER — Other Ambulatory Visit (HOSPITAL_COMMUNITY): Payer: Self-pay | Admitting: Family Medicine

## 2019-02-20 ENCOUNTER — Ambulatory Visit (HOSPITAL_COMMUNITY)
Admission: RE | Admit: 2019-02-20 | Discharge: 2019-02-20 | Disposition: A | Discharge status: Home / Self Care | Payer: BC Managed Care – PPO | Source: Ambulatory Visit | Attending: Family Medicine | Admitting: Family Medicine

## 2019-02-20 DIAGNOSIS — N6321 Unspecified lump in the left breast, upper outer quadrant: Secondary | ICD-10-CM | POA: Diagnosis not present

## 2019-02-20 DIAGNOSIS — R928 Other abnormal and inconclusive findings on diagnostic imaging of breast: Secondary | ICD-10-CM | POA: Diagnosis not present

## 2019-02-20 DIAGNOSIS — C50412 Malignant neoplasm of upper-outer quadrant of left female breast: Secondary | ICD-10-CM | POA: Diagnosis not present

## 2019-02-20 DIAGNOSIS — R239 Unspecified skin changes: Secondary | ICD-10-CM | POA: Diagnosis not present

## 2019-02-20 MED ORDER — LIDOCAINE-EPINEPHRINE (PF) 1 %-1:200000 IJ SOLN
INTRAMUSCULAR | Status: AC
  Administered 2019-02-20: 14:00:00
  Filled 2019-02-20: qty 30

## 2019-02-20 MED ORDER — LIDOCAINE HCL (PF) 2 % IJ SOLN
INTRAMUSCULAR | Status: AC
  Administered 2019-02-20: 14:00:00
  Filled 2019-02-20: qty 10

## 2019-02-20 MED ORDER — SODIUM BICARBONATE 4.2 % IV SOLN
INTRAVENOUS | Status: AC
  Administered 2019-02-20: 14:00:00
  Filled 2019-02-20: qty 10

## 2019-02-21 LAB — SURGICAL PATHOLOGY

## 2019-02-22 DIAGNOSIS — Z719 Counseling, unspecified: Secondary | ICD-10-CM | POA: Diagnosis not present

## 2019-02-22 DIAGNOSIS — E669 Obesity, unspecified: Secondary | ICD-10-CM | POA: Diagnosis not present

## 2019-02-27 ENCOUNTER — Encounter (HOSPITAL_COMMUNITY): Payer: BLUE CROSS/BLUE SHIELD

## 2019-02-27 ENCOUNTER — Other Ambulatory Visit (HOSPITAL_COMMUNITY): Payer: BLUE CROSS/BLUE SHIELD

## 2019-02-28 ENCOUNTER — Other Ambulatory Visit: Payer: Self-pay | Admitting: General Surgery

## 2019-02-28 DIAGNOSIS — Z794 Long term (current) use of insulin: Secondary | ICD-10-CM | POA: Diagnosis not present

## 2019-02-28 DIAGNOSIS — E119 Type 2 diabetes mellitus without complications: Secondary | ICD-10-CM | POA: Diagnosis not present

## 2019-02-28 DIAGNOSIS — C50412 Malignant neoplasm of upper-outer quadrant of left female breast: Secondary | ICD-10-CM

## 2019-02-28 DIAGNOSIS — I10 Essential (primary) hypertension: Secondary | ICD-10-CM | POA: Diagnosis not present

## 2019-03-05 ENCOUNTER — Other Ambulatory Visit: Payer: Self-pay | Admitting: General Surgery

## 2019-03-05 DIAGNOSIS — C50412 Malignant neoplasm of upper-outer quadrant of left female breast: Secondary | ICD-10-CM

## 2019-03-06 ENCOUNTER — Inpatient Hospital Stay (HOSPITAL_COMMUNITY): Payer: BC Managed Care – PPO | Attending: Hematology | Admitting: Hematology

## 2019-03-06 ENCOUNTER — Encounter (HOSPITAL_COMMUNITY): Payer: Self-pay | Admitting: Hematology

## 2019-03-06 ENCOUNTER — Other Ambulatory Visit: Payer: Self-pay

## 2019-03-06 DIAGNOSIS — Z87891 Personal history of nicotine dependence: Secondary | ICD-10-CM | POA: Diagnosis not present

## 2019-03-06 DIAGNOSIS — C50412 Malignant neoplasm of upper-outer quadrant of left female breast: Secondary | ICD-10-CM | POA: Insufficient documentation

## 2019-03-06 DIAGNOSIS — Z170 Estrogen receptor positive status [ER+]: Secondary | ICD-10-CM | POA: Insufficient documentation

## 2019-03-06 DIAGNOSIS — Z803 Family history of malignant neoplasm of breast: Secondary | ICD-10-CM | POA: Diagnosis not present

## 2019-03-06 NOTE — Patient Instructions (Signed)
[place] at [facility] Discharge Instructions  You were seen today by Dr. Delton Coombes. He went over your history, family history and how you've been feeling latley. He will see you back in after your surgery for follow up.   Thank you for choosing [place] at [facility] to provide your oncology and hematology care.  To afford each patient quality time with our provider, please arrive at least 15 minutes before your scheduled appointment time.   If you have a lab appointment with the [facility] please come in thru the  Main Entrance and check in at the main information desk  You need to re-schedule your appointment should you arrive 10 or more minutes late.  We strive to give you quality time with our providers, and arriving late affects you and other patients whose appointments are after yours.  Also, if you no show three or more times for appointments you may be dismissed from the clinic at the providers discretion.     Again, thank you for choosing [facility].  Our hope is that these requests will decrease the amount of time that you wait before being seen by our physicians.       _____________________________________________________________  Should you have questions after your visit to [facility], please contact our office at [phone] between the hours of 8:00 a.m. and 4:30 p.m.  Voicemails left after 4:00 p.m. will not be returned until the following business day.  For prescription refill requests, have your pharmacy contact our office and allow 72 hours.    Cancer Center Support Programs:   > Cancer Support Group  2nd Tuesday of the month 1pm-2pm, Journey Room

## 2019-03-06 NOTE — Progress Notes (Signed)
[facility] NOTE  Patient Care Team: Rory Percy, MD as PCP - General (Family Medicine)  CHIEF COMPLAINTS/PURPOSE OF CONSULTATION:  Newly diagnosed left breast cancer  HISTORY OF PRESENTING ILLNESS:  Erica Giles 63 y.o. female is seen in consultation today the request of Dr. Nadara Mustard for newly diagnosed left breast cancer.  She underwent a screening mammogram on 02/16/2019 which showed suspicious 0.9 cm mass in the left breast upper outer quadrant.  This was followed by ultrasound-guided biopsy on 02/20/2019 which was consistent with grade 2 invasive ductal carcinoma.  Receptor status was positive for ER and PR and negative for HER-2.  She was evaluated by Dr. Dalbert Batman who scheduled for lumpectomy and sentinel lymph node biopsy.  She reports that she has been undergoing mammograms every year without any problems.  She works at a [employer] She quit smoking on 03/16/2008.  She smoked 1 pack/day for 20 years.  Family history significant for 3 paternal aunts with breast cancer.  I reviewed her records extensively and collaborated the history with the patient.  SUMMARY OF ONCOLOGIC HISTORY: Oncology History   No history exists.    In terms of breast cancer risk profile:  She menarched at early age of 5 and went to menopause in 89 when she had a hysterectomy for uterine prolapse. She had 3 pregnancy, her first child was born at age 65 She  received birth control pills for approximately 4 years.  She was never exposed to fertility medications or hormone replacement therapy.  She has  family history of Breast/GYN/GI cancer and 3 paternal aunts.  MEDICAL HISTORY:  Past Medical History:  Diagnosis Date  . Arthritis   . Depression   . Diabetes mellitus    ORAL MED  . GERD (gastroesophageal reflux disease)   . Helicobacter pylori gastritis   . Hiatal hernia   . HNP (herniated nucleus pulposus), lumbar    PT TRYING TO AVOID LUMBAR SURGERY--STATES PAIN  IN HER BACK AND SOMETIMES DOWN ONE OR THE OTHER LEG  . Hypertension   . Plantar fasciitis    BILATERAL  . Schatzki's ring   . Shortness of breath    WITH EXERTION-PT RELATES TO HER WEIGHT  . Sleep apnea    MODERATE- PER SLEEP STUDY 06/2010--PT USES CPAP-SETTING IS 11    SURGICAL HISTORY: Past Surgical History:  Procedure Laterality Date  . ABDOMINAL HYSTERECTOMY    . CARPAL TUNNEL RELEASE     BILATERAL  . CHOLECYSTECTOMY    . CYST REMOVED     LEFT THUMB AND RT MIDDLE FINGER  . TONSILLECTOMY    . TRIGGER FINGER REPAIR      SOCIAL HISTORY: Social History   Socioeconomic History  . Marital status: Married    Spouse name: Not on file  . Number of children: 2  . Years of education: Not on file  . Highest education level: Not on file  Occupational History  . Occupation: Scientist, product/process development: [employer]  Social Needs  . Financial resource strain: Somewhat hard  . Food insecurity    Worry: Never true    Inability: Never true  . Transportation needs    Medical: No    Non-medical: No  Tobacco Use  . Smoking status: Former Smoker    Packs/day: 1.00    Years: 25.00    Pack years: 25.00    Quit date: 03/05/2008    Years since quitting: 11.0  . Smokeless tobacco: Never Used  .  Tobacco comment: 03/2008  Substance and Sexual Activity  . Alcohol use: No  . Drug use: No  . Sexual activity: Not on file  Lifestyle  . Physical activity    Days per week: 0 days    Minutes per session: 0 min  . Stress: To some extent  Relationships  . Social Herbalist on phone: Three times a week    Gets together: Once a week    Attends religious service: More than 4 times per year    Active member of club or organization: Yes    Attends meetings of clubs or organizations: More than 4 times per year    Relationship status: Married  . Intimate partner violence    Fear of current or ex partner: No    Emotionally abused: No    Physically abused: No    Forced sexual  activity: No  Other Topics Concern  . Not on file  Social History Narrative  . Not on file    FAMILY HISTORY: Family History  Problem Relation Age of Onset  . Diabetes Mother   . Kidney disease Mother   . Diabetes Father   . Kidney Stones Sister   . Diabetes Brother   . Kidney Stones Brother   . Scoliosis Son     ALLERGIES:  is allergic to hydrocodone and percocet [oxycodone-acetaminophen].  MEDICATIONS:  Current Outpatient Medications  Medication Sig Dispense Refill  . atorvastatin (LIPITOR) 10 MG tablet Take 10 mg by mouth at bedtime.    . citalopram (CELEXA) 20 MG tablet Take 20 mg by mouth at bedtime.    . cyanocobalamin (,VITAMIN B-12,) 1000 MCG/ML injection INJECT 45m ONCE A MONTH AS DIRECTED.    . DULoxetine (CYMBALTA) 60 MG capsule Take 60 mg by mouth daily.    . Empagliflozin-metFORMIN HCl ER (SYNJARDY XR) 12.10-998 MG TB24 Synjardy XR 12.5 mg-1,000 mg tablet, extended release    . gabapentin (NEURONTIN) 100 MG capsule Take 200 mg by mouth 3 (three) times daily.    .Marland Kitchenlisinopril (PRINIVIL,ZESTRIL) 20 MG tablet Take 20 mg by mouth at bedtime.    .Marland KitchenLORazepam (ATIVAN) 1 MG tablet Take 1 tablet (1 mg total) by mouth every 6 (six) hours as needed (muscle spasm). 15 tablet 0  . Multiple Vitamin (MULTIVITAMIN WITH MINERALS) TABS tablet Take 1 tablet by mouth daily.    .Marland KitchenNOVOLIN N RELION 100 UNIT/ML injection INJECT 10 UNITS SUBCUTANEOUSLY ONCE DAILY AT BEDTIME FOR 30 DAYS    . pantoprazole (PROTONIX) 40 MG tablet Take 1 tablet (40 mg total) by mouth 2 (two) times daily. 60 tablet 11  . SYRINGE-NEEDLE, DISP, 3 ML (B-D 3CC LUER-LOK SYR 25GX5/8") 25G X 5/8" 3 ML MISC BD Luer-Lok Syringe 3 mL 25 x 5/8"  USE TO INJECT VITAMIN B12 ONCE A MONTH.    .Marland KitchenVitamin D, Ergocalciferol, (DRISDOL) 1.25 MG (50000 UT) CAPS capsule Take 50,000 Units by mouth once a week.    .Marland Kitchenibuprofen (ADVIL,MOTRIN) 800 MG tablet Take 800 mg by mouth every 8 (eight) hours as needed. Pain      No current  facility-administered medications for this visit.     REVIEW OF SYSTEMS:   Constitutional: Denies fevers, chills or abnormal night sweats Eyes: Denies blurriness of vision, double vision or watery eyes Ears, nose, mouth, throat, and face: Denies mucositis or sore throat Respiratory: Denies cough, dyspnea or wheezes Cardiovascular: Denies palpitation, chest discomfort or lower extremity swelling Gastrointestinal:  Denies nausea, heartburn or  change in bowel habits Skin: Denies abnormal skin rashes Lymphatics: Denies new lymphadenopathy or easy bruising Neurological: Numbness in the first 3 fingers of both hands for the past 6 months. Behavioral/Psych: Mood is stable, no new changes  Breast:  Denies any palpable lumps or discharge All other systems were reviewed with the patient and are negative.  PHYSICAL EXAMINATION: ECOG PERFORMANCE STATUS: 0 - Asymptomatic  Vitals:   03/06/19 1337  BP: (!) 157/62  Pulse: 73  Resp: 18  Temp: (!) 96.9 F (36.1 C)  SpO2: 99%   Filed Weights   03/06/19 1337  Weight: 227 lb 14.4 oz (103.4 kg)    GENERAL:alert, no distress and comfortable SKIN: skin color, texture, turgor are normal, no rashes or significant lesions EYES: normal, conjunctiva are pink and non-injected, sclera clear OROPHARYNX:no exudate, no erythema and lips, buccal mucosa, and tongue normal  NECK: supple, thyroid normal size, non-tender, without nodularity LYMPH:  no palpable lymphadenopathy in the cervical, axillary or inguinal LUNGS: clear to auscultation and percussion with normal breathing effort HEART: regular rate & rhythm and no murmurs and no lower extremity edema ABDOMEN:abdomen soft, non-tender and normal bowel sounds Musculoskeletal:no cyanosis of digits and no clubbing  PSYCH: alert & oriented x 3 with fluent speech NEURO: no focal motor/sensory deficits BREAST: No palpable masses in bilateral breast.  No palpable lymphadenopathy.  LABORATORY DATA:  I have  reviewed the data as listed Lab Results  Component Value Date   WBC 5.8 05/27/2014   HGB 11.7 (L) 05/27/2014   HCT 34.9 (L) 05/27/2014   MCV 89.9 05/27/2014   PLT 215.0 05/27/2014   Lab Results  Component Value Date   NA 138 02/22/2014   K 4.1 02/22/2014   CL 105 02/22/2014   CO2 23 02/22/2014    RADIOGRAPHIC STUDIES: I have personally reviewed the radiological reports and agreed with the findings in the report.  ASSESSMENT AND PLAN:  Breast cancer of upper-outer quadrant of left female breast ([place]) 1.  Stage I (T1BN0) IDC of the left breast: - Screening mammogram on 02/16/2019 showed suspicious 0.9 cm mass in the 2:30-3 o'clock position of the left breast. - Ultrasound-guided biopsy on 02/20/2019 consistent with grade 2 invasive ductal carcinoma, ER/PR 100% positive, HER-2 negative, Ki-67 1%. - She is scheduled for lumpectomy and SLNB by Dr. Dalbert Batman on 03/17/2019. -I did discuss the pathology report and treatment options in detail. - I will see her back in 4 weeks after surgery to discuss pathology report. - She will most likely be recommended antiestrogen therapy and radiation. -She reports having a DEXA scan at work.  We have told her to obtain reports of it.  If not we will have to repeat it.  2.  Family history: -3 paternal aunts had breast cancer. -We will consider genetic testing.   All questions were answered. The patient knows to call the clinic with any problems, questions or concerns.    Derek Jack, MD 03/06/19

## 2019-03-06 NOTE — Assessment & Plan Note (Addendum)
1.  Stage I (T1BN0) IDC of the left breast: - Screening mammogram on 02/16/2019 showed suspicious 0.9 cm mass in the 2:30-3 o'clock position of the left breast. - Ultrasound-guided biopsy on 02/20/2019 consistent with grade 2 invasive ductal carcinoma, ER/PR 100% positive, HER-2 negative, Ki-67 1%. - She is scheduled for lumpectomy and SLNB by Dr. Dalbert Batman on 03/17/2019. -I did discuss the pathology report and treatment options in detail. - I will see her back in 4 weeks after surgery to discuss pathology report. - She will most likely be recommended antiestrogen therapy and radiation. -She reports having a DEXA scan at work.  We have told her to obtain reports of it.  If not we will have to repeat it.  2.  Family history: -3 paternal aunts had breast cancer. -We will consider genetic testing.

## 2019-03-12 ENCOUNTER — Inpatient Hospital Stay: Payer: PRIVATE HEALTH INSURANCE | Primary: Family Medicine

## 2019-03-12 ENCOUNTER — Encounter

## 2019-03-12 ENCOUNTER — Inpatient Hospital Stay: Admit: 2019-03-12 | Payer: PRIVATE HEALTH INSURANCE | Primary: Family Medicine

## 2019-03-12 DIAGNOSIS — M25552 Pain in left hip: Secondary | ICD-10-CM

## 2019-03-13 ENCOUNTER — Telehealth: Payer: Self-pay | Admitting: Internal Medicine

## 2019-03-13 ENCOUNTER — Other Ambulatory Visit (HOSPITAL_COMMUNITY): Payer: Self-pay | Admitting: *Deleted

## 2019-03-13 ENCOUNTER — Encounter: Payer: Self-pay | Admitting: Internal Medicine

## 2019-03-13 DIAGNOSIS — Z780 Asymptomatic menopausal state: Secondary | ICD-10-CM

## 2019-03-13 NOTE — Telephone Encounter (Signed)
Spoke with pt and she is aware. States she will most likely want to have it done prior to the end of the year. Pt will call back when she is ready to schedule the appt.

## 2019-03-13 NOTE — Telephone Encounter (Signed)
Pt is due for recall colon.  She reported that she was diagnosed with breast cancer and is scheduled for surgery 03/27/19.  She would like to know whether to hold off on colon.

## 2019-03-13 NOTE — Telephone Encounter (Signed)
Please see note below and advise  

## 2019-03-13 NOTE — Telephone Encounter (Signed)
2 small adenomas removed 5 years ago It is certainly okay to delay colonoscopy until she has completed her breast cancer surgery. Okay to delay for 6 or even 12 months if needed due to breast cancer surgery/treatment She should call back to reschedule or you can place another recall for 6 months Thanks

## 2019-03-20 DIAGNOSIS — Z23 Encounter for immunization: Secondary | ICD-10-CM | POA: Diagnosis not present

## 2019-03-21 NOTE — Progress Notes (Signed)
[facility] - [address], [place] [address] Phone: [phone] Fax: [phone]  [facility] [phone]) Specialty - [place], [place] - [address] [place] [address] Phone: [phone] Fax: [phone]      Your procedure is scheduled on October 20  Report to [facility] Main Entrance "A" at 0530 A.M., and check in at the Admitting office.  Call this number if you have problems the morning of surgery:  [phone]  Call [phone] if you have any questions prior to your surgery date Monday-Friday 8am-4pm    Remember:  Do not eat  after midnight the night before your surgery  You may drink clear liquids until 0430 am the morning of your surgery.   Clear liquids allowed are: Water, Non-Citrus Juices (without pulp), Carbonated Beverages, Clear Tea, Black Coffee Only, and Gatorade    Take these medicines the morning of surgery with A SIP OF WATER  DULoxetine (CYMBALTA) gabapentin (NEURONTIN)  pantoprazole (PROTONIX)   7 days prior to surgery STOP taking any Aspirin (unless otherwise instructed by your surgeon), Aleve, Naproxen, Ibuprofen, Motrin, Advil, Goody's, BC's, all herbal medications, fish oil, and all vitamins.   WHAT DO I DO ABOUT MY DIABETES MEDICATION?   Marland Kitchen Do not take oral diabetes medicines (pills) the morning of surgery. Empagliflozin-metFORMIN HCl ER  . THE NIGHT BEFORE SURGERY, take ____30_______ units of __insulin aspart protamine- aspart (NOVOLOG MIX 70/30) _________insulin.       . THE MORNING OF SURGERY, take ______30_______ units of __insulin aspart protamine- aspart (NOVOLOG MIX 70/30) ________insulin.  How to Manage Your Diabetes Before and After Surgery  Why is it important to control my blood sugar before and after surgery? . Improving blood sugar levels before and after surgery helps healing and can limit problems. . A way of improving blood sugar control is eating  a healthy diet by: o  Eating less sugar and carbohydrates o  Increasing activity/exercise o  Talking with your doctor about reaching your blood sugar goals . High blood sugars (greater than 180 mg/dL) can raise your risk of infections and slow your recovery, so you will need to focus on controlling your diabetes during the weeks before surgery. . Make sure that the doctor who takes care of your diabetes knows about your planned surgery including the date and location.  How do I manage my blood sugar before surgery? . Check your blood sugar at least 4 times a day, starting 2 days before surgery, to make sure that the level is not too high or low. o Check your blood sugar the morning of your surgery when you wake up and every 2 hours until you get to the Short Stay unit. . If your blood sugar is less than 70 mg/dL, you will need to treat for low blood sugar: o Do not take insulin. o Treat a low blood sugar (less than 70 mg/dL) with  cup of clear juice (cranberry or apple), 4 glucose tablets, OR glucose gel. o Recheck blood sugar in 15 minutes after treatment (to make sure it is greater than 70 mg/dL). If your blood sugar is not greater than 70 mg/dL on recheck, call [phone] for further instructions. . Report your blood sugar to the short stay nurse when you get to Short Stay.  . If you are admitted to the hospital after surgery: o Your blood sugar will be checked by the staff and you will probably be  given insulin after surgery (instead of oral diabetes medicines) to make sure you have good blood sugar levels. o The goal for blood sugar control after surgery is 80-180 mg/dL.   The Morning of Surgery  Do not wear jewelry, make-up or nail polish.  Do not wear lotions, powders, or perfumes/colognes, or deodorant  Do not shave 48 hours prior to surgery.  Men may shave face and neck.  Do not bring valuables to the hospital.  [facility] is not responsible for any belongings or  valuables.  If you are a smoker, DO NOT Smoke 24 hours prior to surgery IF you wear a CPAP at night please bring your mask, tubing, and machine the morning of surgery   Remember that you must have someone to transport you home after your surgery, and remain with you for 24 hours if you are discharged the same day.   Contacts, glasses, hearing aids, dentures or bridgework may not be worn into surgery.    Leave your suitcase in the car.  After surgery it may be brought to your room.  For patients admitted to the hospital, discharge time will be determined by your treatment team.  Patients discharged the day of surgery will not be allowed to drive home.    Special instructions:   [facility]- Preparing For Surgery  Before surgery, you can play an important role. Because skin is not sterile, your skin needs to be as free of germs as possible. You can reduce the number of germs on your skin by washing with CHG (chlorahexidine gluconate) Soap before surgery.  CHG is an antiseptic cleaner which kills germs and bonds with the skin to continue killing germs even after washing.    Oral Hygiene is also important to reduce your risk of infection.  Remember - BRUSH YOUR TEETH THE MORNING OF SURGERY WITH YOUR REGULAR TOOTHPASTE  Please do not use if you have an allergy to CHG or antibacterial soaps. If your skin becomes reddened/irritated stop using the CHG.  Do not shave (including legs and underarms) for at least 48 hours prior to first CHG shower. It is OK to shave your face.  Please follow these instructions carefully.   1. Shower the NIGHT BEFORE SURGERY and the MORNING OF SURGERY with CHG Soap.   2. If you chose to wash your hair, wash your hair first as usual with your normal shampoo.  3. After you shampoo, rinse your hair and body thoroughly to remove the shampoo.  4. Use CHG as you would any other liquid soap. You can apply CHG directly to the skin and wash gently with a scrungie or a  clean washcloth.   5. Apply the CHG Soap to your body ONLY FROM THE NECK DOWN.  Do not use on open wounds or open sores. Avoid contact with your eyes, ears, mouth and genitals (private parts). Wash Face and genitals (private parts)  with your normal soap.   6. Wash thoroughly, paying special attention to the area where your surgery will be performed.  7. Thoroughly rinse your body with warm water from the neck down.  8. DO NOT shower/wash with your normal soap after using and rinsing off the CHG Soap.  9. Pat yourself dry with a CLEAN TOWEL.  10. Wear CLEAN PAJAMAS to bed the night before surgery, wear comfortable clothes the morning of surgery  11. Place CLEAN SHEETS on your bed the night of your first shower and DO NOT SLEEP WITH PETS.  Day of Surgery:  Do not apply any deodorants/lotions. Please shower the morning of surgery with the CHG soap  Please wear clean clothes to the hospital/surgery center.   Remember to brush your teeth WITH YOUR REGULAR TOOTHPASTE.   Please read over the following fact sheets that you were given.

## 2019-03-22 ENCOUNTER — Encounter (HOSPITAL_COMMUNITY): Payer: Self-pay

## 2019-03-22 ENCOUNTER — Other Ambulatory Visit: Payer: Self-pay

## 2019-03-22 ENCOUNTER — Encounter (HOSPITAL_COMMUNITY)
Admission: RE | Admit: 2019-03-22 | Discharge: 2019-03-22 | Disposition: A | Discharge status: Home / Self Care | Payer: BC Managed Care – PPO | Source: Ambulatory Visit | Attending: General Surgery | Admitting: General Surgery

## 2019-03-22 DIAGNOSIS — Z01818 Encounter for other preprocedural examination: Secondary | ICD-10-CM | POA: Insufficient documentation

## 2019-03-22 DIAGNOSIS — R9431 Abnormal electrocardiogram [ECG] [EKG]: Secondary | ICD-10-CM | POA: Insufficient documentation

## 2019-03-22 DIAGNOSIS — I10 Essential (primary) hypertension: Secondary | ICD-10-CM | POA: Insufficient documentation

## 2019-03-22 HISTORY — DX: Anxiety disorder, unspecified: F41.9

## 2019-03-22 LAB — CBC WITH DIFFERENTIAL/PLATELET
Abs Immature Granulocytes: 0.03 10*3/uL (ref 0.00–0.07)
Basophils Absolute: 0 10*3/uL (ref 0.0–0.1)
Basophils Relative: 1 %
Eosinophils Absolute: 0.1 10*3/uL (ref 0.0–0.5)
Eosinophils Relative: 1 %
HCT: 36.5 % (ref 36.0–46.0)
Hemoglobin: 12 g/dL (ref 12.0–15.0)
Immature Granulocytes: 1 %
Lymphocytes Relative: 37 %
Lymphs Abs: 2.3 10*3/uL (ref 0.7–4.0)
MCH: 30.8 pg (ref 26.0–34.0)
MCHC: 32.9 g/dL (ref 30.0–36.0)
MCV: 93.6 fL (ref 80.0–100.0)
Monocytes Absolute: 0.4 10*3/uL (ref 0.1–1.0)
Monocytes Relative: 7 %
Neutro Abs: 3.3 10*3/uL (ref 1.7–7.7)
Neutrophils Relative %: 53 %
Platelets: 208 10*3/uL (ref 150–400)
RBC: 3.9 MIL/uL (ref 3.87–5.11)
RDW: 13.5 % (ref 11.5–15.5)
WBC: 6.2 10*3/uL (ref 4.0–10.5)
nRBC: 0 % (ref 0.0–0.2)

## 2019-03-22 LAB — COMPREHENSIVE METABOLIC PANEL
ALT: 17 U/L (ref 0–44)
AST: 11 U/L — ABNORMAL LOW (ref 15–41)
Albumin: 3.5 g/dL (ref 3.5–5.0)
Alkaline Phosphatase: 92 U/L (ref 38–126)
Anion gap: 8 (ref 5–15)
BUN: 17 mg/dL (ref 8–23)
CO2: 26 mmol/L (ref 22–32)
Calcium: 9 mg/dL (ref 8.9–10.3)
Chloride: 107 mmol/L (ref 98–111)
Creatinine, Ser: 1.13 mg/dL — ABNORMAL HIGH (ref 0.44–1.00)
GFR calc Af Amer: >60 mL/min (ref >60)
GFR calc non Af Amer: 52 mL/min — ABNORMAL LOW (ref >60)
Glucose, Bld: 132 mg/dL — ABNORMAL HIGH (ref 70–99)
Potassium: 4.2 mmol/L (ref 3.5–5.1)
Sodium: 141 mmol/L (ref 135–145)
Total Bilirubin: 0.5 mg/dL (ref 0.3–1.2)
Total Protein: 6.3 g/dL — ABNORMAL LOW (ref 6.5–8.1)

## 2019-03-22 LAB — HEMOGLOBIN A1C
Hgb A1c MFr Bld: 8.9 % — ABNORMAL HIGH (ref 4.8–5.6)
Mean Plasma Glucose: 208.73 mg/dL

## 2019-03-22 LAB — GLUCOSE, CAPILLARY: Glucose-Capillary: 152 mg/dL — ABNORMAL HIGH (ref 70–99)

## 2019-03-22 MED ORDER — CHLORHEXIDINE GLUCONATE CLOTH 2 % EX PADS: 6.0000 | MEDICATED_PAD | Freq: Once | CUTANEOUS | Status: DC

## 2019-03-22 NOTE — Progress Notes (Signed)
PCP - DR Rickey Primus Cardiologist - NA     -   Chest x-ray - 2013 EKG - TODAY Stress Test - NA ECHO - NA   Sleep Study - YES CPAP -  YES       Fasting Blood Sugar -152  Checks Blood Sugar __2___ times a day   Aspirin Instructions:STOP ERAS Protcol -   YES    COVID TEST- TOMORROW   Anesthesia review:  REVIEW EKG Patient denies shortness of breath, fever, cough and chest pain at PAT appointment   All instructions explained to the patient, with a verbal understanding of the material. Patient agrees to go over the instructions while at home for a better understanding. Patient also instructed to self quarantine after being tested for COVID-19. The opportunity to ask questions was provided.

## 2019-03-23 ENCOUNTER — Other Ambulatory Visit (HOSPITAL_COMMUNITY)
Admission: RE | Admit: 2019-03-23 | Discharge: 2019-03-23 | Disposition: A | Discharge status: Home / Self Care | Payer: BC Managed Care – PPO | Source: Ambulatory Visit | Attending: General Surgery | Admitting: General Surgery

## 2019-03-23 DIAGNOSIS — Z20828 Contact with and (suspected) exposure to other viral communicable diseases: Secondary | ICD-10-CM | POA: Insufficient documentation

## 2019-03-24 LAB — NOVEL CORONAVIRUS, NAA (HOSP ORDER, SEND-OUT TO REF LAB; TAT 18-24 HRS): SARS-CoV-2, NAA: NOT DETECTED

## 2019-03-24 NOTE — H&P (Signed)
Erica Giles Location: [facility] Patient #: [id number] DOB: 07/18/1955 Undefined / Language: Cleophus Molt / Race: White Female      History of Present Illness     .This is a pleasant 63 year old female from [place].. She is referred by Rory Percy at [facility] for evaluation of new cancer left breast. Ammie Ferrier and [facility] performed her imaging studies. Delrae Rend is her endocrinologist. Dr. Delton Coombes will be her medical oncologist at [facility].      She has no prior history of breast disease. She gets annual screening mammograms. Recent screening mammograms and follow-up imaging showing a 9 mm spiculated mass in the lateral left breast at about the 2:30 position, 7 cm from the nipple. Axillary ultrasound negative. Image guided biopsy of the left breast mass, upper outer quadrant shows grade 2 invasive ductal carcinoma and DCIS. Breast diagnostic profile pending. She has an appointment to see Dr. Delton Coombes.      Comorbidities include insulin-dependent diabetes. Hypertension. Hiatal hernia. Obstructive sleep apnea uses CPAP. GERD. Remote history of open cholecystectomy. Left humerus fracture managed by Dr. Tonita Cong, Abdominal hysterectomy for benign disease. Family history significant for 3 paternal aunt treated for breast cancer. Otherwise no history of ovarian cancer pancreatic cancer or prostate cancer Social history reveals she is married with 2 children. Lives in [place]. There is a 24 month old great-grandson at home. She denies alcohol or tobacco. 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  We had a long discussion. We talked about surgical management of her breast cancer.       The options of lumpectomy, sentinel node biopsy, radiation therapy. We talked about mastectomy with or without reconstruction. She is strongly motivated for breast conservation surgery and I think she is an excellent candidate for that due to the small size  of her tumor.       We will begin scheduling for left breast lumpectomy with radioactive seed localization, inject blue dye left breast, left axillary deep sentinel lymph node biopsy I discussed the indications, details, techniques, and numerous risk of the surgery with her and her husband. She is aware the risk of bleeding, infection, cosmetic deformity, chronic pain, reoperation for positive margins or multiple positive nodes, seroma formation, arm swelling, arm numbness, shoulder disability. She understands all these issues. All of her questions were answered. She agrees with this plan.  I told her that the breast diagnostic profile and her consultation with Dr. Delton Coombes could have impact on her surgery, if he decides she needs chemotherapy based on her breast diagnostic profile. Clearly the surgery will be scheduled after she gets her medical oncology consultation I told her that Dr. Delton Coombes may refer her for genetic counseling and testing. I told her that radiation therapy would likely be delivered in [place], which would be less of a hardship for her than [place] We are going to try to get her on for breast conference next Wednesday Obtain breast diagnostic profile.    Addendum Note Presented in breast conference today. Consensus recommendations was breast conservation surgery with left lumpectomy and sentinel node biopsy Likely whole breast radiation therapy to be followed by anti-estrogens Genetic testing to be considered due to 3 paternal aunts with breast cancer She saw Dr. Delton Coombes at [facility] on September 29, and his recommendations were consistent with the conference recommendations.   Past Surgical History Breast Biopsy  Left. Cesarean Section - 1  Gallbladder Surgery - Open  Hysterectomy (not due  to cancer) - Partial  Tonsillectomy   Diagnostic Studies History Colonoscopy  1-5 years ago Mammogram  within last year Pap Smear  >5 years  ago  Allergies No Known Allergies  No Known Drug Allergies Allergies Reconciled   Medication History Atorvastatin Calcium ('40MG'$  Tablet, Oral) Active. BD Luer-Lok Syringe (25G X 1"3 ML Misc,) Active. Cyanocobalamin (1000MCG/ML Solution, Injection) Active. Gabapentin ('300MG'$  Capsule, Oral) Active. BD Luer-Lok Syringe (25G X 5/8"3 ML Misc,) Active. Lisinopril ('20MG'$  Tablet, Oral) Active. Pantoprazole Sodium ('40MG'$  Tablet DR, Oral) Active. Vitamin D (Ergocalciferol) (1.25 MG(50000 UT) Capsule, Oral) Active. Synjardy XR (12.5-'1000MG'$  Tablet ER 24HR, Oral) Active. NovoLIN R ReliOn (100UNIT/ML Solution, Injection) Active. NovoLIN 70/30 ReliOn ((70-30) 100UNIT/ML Suspension, Subcutaneous) Active. Ibuprofen ('800MG'$  Tablet, Oral) Active. DULoxetine HCl ('60MG'$  Capsule DR Part, Oral) Active. Medications Reconciled  Social History  Caffeine use  Carbonated beverages, Coffee, Tea. No alcohol use  No drug use  Tobacco use  Former smoker.  Family History Diabetes Mellitus  Brother, Father, Mother. Kidney Disease  Mother.  Pregnancy / Birth History  Age at menarche  58 years. Age of menopause  >61 Contraceptive History  Oral contraceptives. Gravida  2 Maternal age  81-25 Para  3 Regular periods   Other Problems  Arthritis  Back Pain  Depression  Diabetes Mellitus  Gastroesophageal Reflux Disease  High blood pressure  Hypercholesterolemia  Lump In Breast  Sleep Apnea     Review of Systems General Not Present- Appetite Loss, Chills, Fatigue, Fever, Night Sweats, Weight Gain and Weight Loss. Skin Not Present- Change in Wart/Mole, Dryness, Hives, Jaundice, New Lesions, Non-Healing Wounds, Rash and Ulcer. HEENT Present- Wears glasses/contact lenses. Not Present- Earache, Hearing Loss, Hoarseness, Nose Bleed, Oral Ulcers, Ringing in the Ears, Seasonal Allergies, Sinus Pain, Sore Throat, Visual Disturbances and Yellow Eyes. Respiratory Not Present-  Bloody sputum, Chronic Cough, Difficulty Breathing, Snoring and Wheezing. Breast Present- Breast Mass. Not Present- Breast Pain, Nipple Discharge and Skin Changes. Cardiovascular Present- Swelling of Extremities. Not Present- Chest Pain, Difficulty Breathing Lying Down, Leg Cramps, Palpitations, Rapid Heart Rate and Shortness of Breath. Gastrointestinal Present- Indigestion. Not Present- Abdominal Pain, Bloating, Bloody Stool, Change in Bowel Habits, Chronic diarrhea, Constipation, Difficulty Swallowing, Excessive gas, Gets full quickly at meals, Hemorrhoids, Nausea, Rectal Pain and Vomiting. Female Genitourinary Not Present- Frequency, Nocturia, Painful Urination, Pelvic Pain and Urgency. Musculoskeletal Present- Back Pain and Joint Pain. Not Present- Joint Stiffness, Muscle Pain, Muscle Weakness and Swelling of Extremities. Neurological Present- Numbness. Not Present- Decreased Memory, Fainting, Headaches, Seizures, Tingling, Tremor, Trouble walking and Weakness. Psychiatric Present- Depression. Not Present- Anxiety, Bipolar, Change in Sleep Pattern, Fearful and Frequent crying. Endocrine Not Present- Cold Intolerance, Excessive Hunger, Hair Changes, Heat Intolerance, Hot flashes and New Diabetes. Hematology Not Present- Blood Thinners, Easy Bruising, Excessive bleeding, Gland problems, HIV and Persistent Infections.  Vitals  Weight: 226.6 lb Height: 65in Body Surface Area: 2.09 m Body Mass Index: 37.71 kg/m  Temp.: 94.41F (Temporal)  Pulse: 134 (Regular)  BP: 152/88(Sitting, Left Arm, Standard)       Physical Exam  General Mental Status-Alert. General Appearance-Consistent with stated age. Hydration-Well hydrated. Voice-Normal. Note: BMI 37   Head and Neck Head-normocephalic, atraumatic with no lesions or palpable masses. Trachea-midline. Thyroid Gland Characteristics - normal size and consistency.  Eye Eyeball - Bilateral-Extraocular movements  intact. Sclera/Conjunctiva - Bilateral-No scleral icterus.  Chest and Lung Exam Chest and lung exam reveals -quiet, even and easy respiratory effort with no use of accessory muscles and on auscultation, normal breath sounds, no  adventitious sounds and normal vocal resonance. Inspection Chest Wall - Normal. Back - normal.  Breast Note: Breasts are large. Some ecchymoses laterally left breast but no palpable mass in either breast. No other skin changes. No axillary adenopathy.   Cardiovascular Cardiovascular examination reveals -normal heart sounds, regular rate and rhythm with no murmurs and normal pedal pulses bilaterally.  Abdomen Inspection Inspection of the abdomen reveals - No Hernias. Skin - Scar - Note: Transverse scar right upper quadrant. Hysterectomy incision. Palpation/Percussion Palpation and Percussion of the abdomen reveal - Soft, Non Tender, No Rebound tenderness, No Rigidity (guarding) and No hepatosplenomegaly. Auscultation Auscultation of the abdomen reveals - Bowel sounds normal.  Neurologic Neurologic evaluation reveals -alert and oriented x 3 with no impairment of recent or remote memory. Mental Status-Normal.  Musculoskeletal Normal Exam - Left-Upper Extremity Strength Normal and Lower Extremity Strength Normal. Normal Exam - Right-Upper Extremity Strength Normal and Lower Extremity Strength Normal.  Lymphatic Head & Neck  General Head & Neck Lymphatics: Bilateral - Description - Normal. Axillary  General Axillary Region: Bilateral - Description - Normal. Tenderness - Non Tender. Femoral & Inguinal  Generalized Femoral & Inguinal Lymphatics: Bilateral - Description - Normal. Tenderness - Non Tender.    Assessment & Plan   PRIMARY CANCER OF UPPER OUTER QUADRANT OF LEFT FEMALE BREAST (C50.412)    Your recent mammograms show a 9 mm mass in the lateral left breast Image guided biopsy shows invasive ductal carcinoma Estrogen receptor,  progesterone receptor, and HER-2 test is pending  You have an appointment to see a medical oncologist, Dr. Delton Coombes, at [facility] next week. Be sure to keep that appointment They may refer you for genetic testing They may or may not know whether they're going to get chemotherapy  The first step in your treatment is surgery, however We discussed the options of lumpectomy, mastectomy, sentinel lymph node biopsy, radiation therapy. You prefer breast conservation and I think you are a good candidate for that you will likely be offered postop radiation therapy, and that can be delivered in [place]  you will be scheduled for left breast lumpectomy with radioactive seed localization, inject blue dye left breast, left axillary sentinel lymph node biopsy I discussed the indications, techniques, and risks of this surgery in detail with you and your husband   TYPE 2 DIABETES MELLITUS WITH INSULIN THERAPY (E11.9)  HYPERTENSION, ESSENTIAL (I10)  BMI 37.0-37.9, ADULT (Z68.37)  SLEEP APNEA IN ADULT (G47.30)  CHRONIC GERD (K21.9)  HISTORY OF HYSTERECTOMY FOR BENIGN DISEASE (Z90.710)  HISTORY OF CHOLECYSTECTOMY (Z90.49)  FAMILY HISTORY OF BREAST CANCER (Z80.3) Impression: 3 paternal aunts treated for breast cancer. Genetics unknown.    Edsel Petrin. Dalbert Batman, M.D., [facility] General and Minimally invasive Surgery Breast and Colorectal Surgery

## 2019-03-26 ENCOUNTER — Encounter (HOSPITAL_COMMUNITY): Payer: Self-pay | Admitting: Anesthesiology

## 2019-03-26 ENCOUNTER — Ambulatory Visit
Admission: RE | Admit: 2019-03-26 | Discharge: 2019-03-26 | Disposition: A | Discharge status: Home / Self Care | Payer: BC Managed Care – PPO | Source: Ambulatory Visit | Attending: General Surgery | Admitting: General Surgery

## 2019-03-26 ENCOUNTER — Other Ambulatory Visit: Payer: Self-pay | Admitting: General Surgery

## 2019-03-26 ENCOUNTER — Other Ambulatory Visit: Payer: Self-pay

## 2019-03-26 DIAGNOSIS — C50412 Malignant neoplasm of upper-outer quadrant of left female breast: Secondary | ICD-10-CM

## 2019-03-26 NOTE — Anesthesia Preprocedure Evaluation (Addendum)
Anesthesia Evaluation  Patient identified by MRN, date of birth, ID band Patient awake    Reviewed: Allergy & Precautions, NPO status , Patient's Chart, lab work & pertinent test results  Airway Mallampati: III       Dental no notable dental hx. (+) Teeth Intact   Pulmonary sleep apnea and Continuous Positive Airway Pressure Ventilation , former smoker,    Pulmonary exam normal breath sounds clear to auscultation       Cardiovascular hypertension, Pt. on medications Normal cardiovascular exam Rhythm:Regular Rate:Normal     Neuro/Psych PSYCHIATRIC DISORDERS Anxiety Depression    GI/Hepatic GERD  Medicated and Controlled,  Endo/Other  diabetes, Type 2, Insulin Dependent  Renal/GU      Musculoskeletal   Abdominal (+) + obese,   Peds  Hematology negative hematology ROS (+)   Anesthesia Other Findings   Reproductive/Obstetrics                            Anesthesia Physical Anesthesia Plan  ASA: II  Anesthesia Plan: General   Post-op Pain Management:  Regional for Post-op pain   Induction:   PONV Risk Score and Plan:   Airway Management Planned: LMA  Additional Equipment: None  Intra-op Plan:   Post-operative Plan: Extubation in OR  Informed Consent: I have reviewed the patients History and Physical, chart, labs and discussed the procedure including the risks, benefits and alternatives for the proposed anesthesia with the patient or authorized representative who has indicated his/her understanding and acceptance.     Dental advisory given  Plan Discussed with: CRNA  Anesthesia Plan Comments:        Anesthesia Quick Evaluation

## 2019-03-27 ENCOUNTER — Encounter (HOSPITAL_COMMUNITY)
Admission: RE | Disposition: A | Discharge status: Home / Self Care | Payer: Self-pay | Source: Home / Self Care | Attending: General Surgery

## 2019-03-27 ENCOUNTER — Other Ambulatory Visit: Payer: Self-pay

## 2019-03-27 ENCOUNTER — Encounter (HOSPITAL_COMMUNITY)
Admission: RE | Admit: 2019-03-27 | Discharge: 2019-03-27 | Disposition: A | Discharge status: Home / Self Care | Payer: BC Managed Care – PPO | Source: Ambulatory Visit | Attending: General Surgery | Admitting: General Surgery

## 2019-03-27 ENCOUNTER — Ambulatory Visit (HOSPITAL_COMMUNITY): Payer: BC Managed Care – PPO | Admitting: Physician Assistant

## 2019-03-27 ENCOUNTER — Ambulatory Visit (HOSPITAL_COMMUNITY): Payer: BC Managed Care – PPO | Admitting: Anesthesiology

## 2019-03-27 ENCOUNTER — Ambulatory Visit (HOSPITAL_COMMUNITY)
Admission: RE | Admit: 2019-03-27 | Discharge: 2019-03-27 | Disposition: A | Discharge status: Home / Self Care | Payer: BC Managed Care – PPO | Attending: General Surgery | Admitting: General Surgery

## 2019-03-27 ENCOUNTER — Ambulatory Visit
Admission: RE | Admit: 2019-03-27 | Discharge: 2019-03-27 | Disposition: A | Discharge status: Home / Self Care | Payer: BC Managed Care – PPO | Source: Ambulatory Visit | Attending: General Surgery | Admitting: General Surgery

## 2019-03-27 ENCOUNTER — Encounter (HOSPITAL_COMMUNITY): Payer: Self-pay | Admitting: *Deleted

## 2019-03-27 DIAGNOSIS — G8918 Other acute postprocedural pain: Secondary | ICD-10-CM | POA: Diagnosis not present

## 2019-03-27 DIAGNOSIS — Z791 Long term (current) use of non-steroidal anti-inflammatories (NSAID): Secondary | ICD-10-CM | POA: Insufficient documentation

## 2019-03-27 DIAGNOSIS — Z794 Long term (current) use of insulin: Secondary | ICD-10-CM | POA: Insufficient documentation

## 2019-03-27 DIAGNOSIS — G4730 Sleep apnea, unspecified: Secondary | ICD-10-CM | POA: Insufficient documentation

## 2019-03-27 DIAGNOSIS — C773 Secondary and unspecified malignant neoplasm of axilla and upper limb lymph nodes: Secondary | ICD-10-CM | POA: Diagnosis not present

## 2019-03-27 DIAGNOSIS — E7800 Pure hypercholesterolemia, unspecified: Secondary | ICD-10-CM | POA: Diagnosis not present

## 2019-03-27 DIAGNOSIS — Z87891 Personal history of nicotine dependence: Secondary | ICD-10-CM | POA: Insufficient documentation

## 2019-03-27 DIAGNOSIS — M1990 Unspecified osteoarthritis, unspecified site: Secondary | ICD-10-CM | POA: Diagnosis not present

## 2019-03-27 DIAGNOSIS — F329 Major depressive disorder, single episode, unspecified: Secondary | ICD-10-CM | POA: Diagnosis not present

## 2019-03-27 DIAGNOSIS — I10 Essential (primary) hypertension: Secondary | ICD-10-CM | POA: Diagnosis not present

## 2019-03-27 DIAGNOSIS — N6489 Other specified disorders of breast: Secondary | ICD-10-CM | POA: Diagnosis not present

## 2019-03-27 DIAGNOSIS — K219 Gastro-esophageal reflux disease without esophagitis: Secondary | ICD-10-CM | POA: Diagnosis not present

## 2019-03-27 DIAGNOSIS — C50412 Malignant neoplasm of upper-outer quadrant of left female breast: Secondary | ICD-10-CM

## 2019-03-27 DIAGNOSIS — G4733 Obstructive sleep apnea (adult) (pediatric): Secondary | ICD-10-CM | POA: Insufficient documentation

## 2019-03-27 DIAGNOSIS — C50912 Malignant neoplasm of unspecified site of left female breast: Secondary | ICD-10-CM | POA: Diagnosis not present

## 2019-03-27 DIAGNOSIS — N6012 Diffuse cystic mastopathy of left breast: Secondary | ICD-10-CM | POA: Diagnosis not present

## 2019-03-27 DIAGNOSIS — R928 Other abnormal and inconclusive findings on diagnostic imaging of breast: Secondary | ICD-10-CM | POA: Diagnosis not present

## 2019-03-27 DIAGNOSIS — Z79899 Other long term (current) drug therapy: Secondary | ICD-10-CM | POA: Diagnosis not present

## 2019-03-27 DIAGNOSIS — E119 Type 2 diabetes mellitus without complications: Secondary | ICD-10-CM | POA: Diagnosis not present

## 2019-03-27 DIAGNOSIS — Z170 Estrogen receptor positive status [ER+]: Secondary | ICD-10-CM | POA: Insufficient documentation

## 2019-03-27 DIAGNOSIS — Z803 Family history of malignant neoplasm of breast: Secondary | ICD-10-CM | POA: Diagnosis not present

## 2019-03-27 DIAGNOSIS — E785 Hyperlipidemia, unspecified: Secondary | ICD-10-CM | POA: Diagnosis not present

## 2019-03-27 HISTORY — PX: BREAST LUMPECTOMY WITH RADIOACTIVE SEED AND SENTINEL LYMPH NODE BIOPSY: SHX6550

## 2019-03-27 LAB — GLUCOSE, CAPILLARY
Glucose-Capillary: 93 mg/dL (ref 70–99)
Glucose-Capillary: 81 mg/dL (ref 70–99)

## 2019-03-27 SURGERY — BREAST LUMPECTOMY WITH RADIOACTIVE SEED AND SENTINEL LYMPH NODE BIOPSY
Anesthesia: General | Laterality: Left

## 2019-03-27 MED ORDER — MIDAZOLAM HCL 5 MG/5ML IJ SOLN
INTRAMUSCULAR | Status: DC | PRN
  Administered 2019-03-27: 2 mg via INTRAVENOUS

## 2019-03-27 MED ORDER — BUPIVACAINE HCL (PF) 0.25 % IJ SOLN
INTRAMUSCULAR | Status: DC | PRN
  Administered 2019-03-27 (×10): 5 mL

## 2019-03-27 MED ORDER — MEPERIDINE HCL 25 MG/ML IJ SOLN: 6.2500 mg | INTRAMUSCULAR | Status: DC | PRN

## 2019-03-27 MED ORDER — HYDROCODONE-ACETAMINOPHEN 5-325 MG PO TABS
2.0000 | ORAL_TABLET | Freq: Once | ORAL | Status: AC
  Administered 2019-03-27: 2 via ORAL

## 2019-03-27 MED ORDER — TECHNETIUM TC 99M SULFUR COLLOID FILTERED
1.0000 | Freq: Once | INTRAVENOUS | Status: AC | PRN
  Administered 2019-03-27: 1 via INTRADERMAL

## 2019-03-27 MED ORDER — GABAPENTIN 300 MG PO CAPS
300.0000 mg | ORAL_CAPSULE | ORAL | Status: AC
  Administered 2019-03-27: 300 mg via ORAL
  Filled 2019-03-27: qty 1

## 2019-03-27 MED ORDER — FENTANYL CITRATE (PF) 250 MCG/5ML IJ SOLN
INTRAMUSCULAR | Status: AC
  Filled 2019-03-27: qty 5

## 2019-03-27 MED ORDER — DEXAMETHASONE SODIUM PHOSPHATE 4 MG/ML IJ SOLN
INTRAMUSCULAR | Status: DC | PRN
  Administered 2019-03-27: 10 mg via INTRAVENOUS

## 2019-03-27 MED ORDER — PROMETHAZINE HCL 25 MG/ML IJ SOLN: 6.2500 mg | INTRAMUSCULAR | Status: DC | PRN

## 2019-03-27 MED ORDER — EPHEDRINE SULFATE-NACL 50-0.9 MG/10ML-% IV SOSY
PREFILLED_SYRINGE | INTRAVENOUS | Status: DC | PRN
  Administered 2019-03-27 (×3): 10 mg via INTRAVENOUS

## 2019-03-27 MED ORDER — PROPOFOL 10 MG/ML IV BOLUS
INTRAVENOUS | Status: DC | PRN
  Administered 2019-03-27: 200 mg via INTRAVENOUS

## 2019-03-27 MED ORDER — METHYLENE BLUE 0.5 % INJ SOLN
INTRAVENOUS | Status: AC
  Filled 2019-03-27: qty 10

## 2019-03-27 MED ORDER — ONDANSETRON HCL 4 MG/2ML IJ SOLN
INTRAMUSCULAR | Status: AC
  Filled 2019-03-27: qty 2

## 2019-03-27 MED ORDER — PROPOFOL 10 MG/ML IV BOLUS
INTRAVENOUS | Status: AC
  Filled 2019-03-27: qty 20

## 2019-03-27 MED ORDER — MIDAZOLAM HCL 2 MG/2ML IJ SOLN
INTRAMUSCULAR | Status: AC
  Filled 2019-03-27: qty 2

## 2019-03-27 MED ORDER — ACETAMINOPHEN 500 MG PO TABS
1000.0000 mg | ORAL_TABLET | ORAL | Status: AC
  Administered 2019-03-27: 1000 mg via ORAL
  Filled 2019-03-27: qty 2

## 2019-03-27 MED ORDER — DEXAMETHASONE SODIUM PHOSPHATE 10 MG/ML IJ SOLN
INTRAMUSCULAR | Status: AC
  Filled 2019-03-27: qty 1

## 2019-03-27 MED ORDER — KETOROLAC TROMETHAMINE 30 MG/ML IJ SOLN
30.0000 mg | Freq: Once | INTRAMUSCULAR | Status: AC | PRN
  Administered 2019-03-27: 30 mg via INTRAVENOUS

## 2019-03-27 MED ORDER — SODIUM CHLORIDE 0.9% FLUSH: 3.0000 mL | Freq: Two times a day (BID) | INTRAVENOUS | Status: DC

## 2019-03-27 MED ORDER — FENTANYL CITRATE (PF) 100 MCG/2ML IJ SOLN
INTRAMUSCULAR | Status: DC | PRN
  Administered 2019-03-27 (×2): 50 ug via INTRAVENOUS
  Administered 2019-03-27 (×2): 25 ug via INTRAVENOUS
  Administered 2019-03-27: 50 ug via INTRAVENOUS

## 2019-03-27 MED ORDER — BUPIVACAINE-EPINEPHRINE (PF) 0.25% -1:200000 IJ SOLN
INTRAMUSCULAR | Status: AC
  Filled 2019-03-27: qty 20

## 2019-03-27 MED ORDER — HYDROCODONE-ACETAMINOPHEN 5-325 MG PO TABS: 1.0000 | ORAL_TABLET | Freq: Four times a day (QID) | ORAL | 0 refills | Status: DC | PRN

## 2019-03-27 MED ORDER — LIDOCAINE 2% (20 MG/ML) 5 ML SYRINGE
INTRAMUSCULAR | Status: DC | PRN
  Administered 2019-03-27: 100 mg via INTRAVENOUS

## 2019-03-27 MED ORDER — FENTANYL CITRATE (PF) 100 MCG/2ML IJ SOLN: 25.0000 ug | INTRAMUSCULAR | Status: DC | PRN

## 2019-03-27 MED ORDER — SODIUM CHLORIDE (PF) 0.9 % IJ SOLN
INTRAVENOUS | Status: DC | PRN
  Administered 2019-03-27: 5 mL via INTRAMUSCULAR

## 2019-03-27 MED ORDER — ONDANSETRON HCL 4 MG/2ML IJ SOLN
INTRAMUSCULAR | Status: DC | PRN
  Administered 2019-03-27: 4 mg via INTRAVENOUS

## 2019-03-27 MED ORDER — BUPIVACAINE-EPINEPHRINE 0.25% -1:200000 IJ SOLN
INTRAMUSCULAR | Status: DC | PRN
  Administered 2019-03-27: 28 mL

## 2019-03-27 MED ORDER — 0.9 % SODIUM CHLORIDE (POUR BTL) OPTIME
TOPICAL | Status: DC | PRN
  Administered 2019-03-27: 1000 mL

## 2019-03-27 MED ORDER — SODIUM CHLORIDE (PF) 0.9 % IJ SOLN
INTRAMUSCULAR | Status: AC
  Filled 2019-03-27: qty 10

## 2019-03-27 MED ORDER — LIDOCAINE 2% (20 MG/ML) 5 ML SYRINGE
INTRAMUSCULAR | Status: AC
  Filled 2019-03-27: qty 5

## 2019-03-27 MED ORDER — LACTATED RINGERS IV SOLN
INTRAVENOUS | Status: DC | PRN
  Administered 2019-03-27 (×2): via INTRAVENOUS

## 2019-03-27 MED ORDER — BUPIVACAINE-EPINEPHRINE (PF) 0.25% -1:200000 IJ SOLN
INTRAMUSCULAR | Status: AC
  Filled 2019-03-27: qty 10

## 2019-03-27 MED ORDER — KETOROLAC TROMETHAMINE 30 MG/ML IJ SOLN
INTRAMUSCULAR | Status: AC
  Filled 2019-03-27: qty 1

## 2019-03-27 MED ORDER — CELECOXIB 200 MG PO CAPS
200.0000 mg | ORAL_CAPSULE | ORAL | Status: AC
  Administered 2019-03-27: 07:00:00 200 mg via ORAL
  Filled 2019-03-27: qty 1

## 2019-03-27 MED ORDER — DEXTROSE 5 % IV SOLN
3.0000 g | INTRAVENOUS | Status: AC
  Administered 2019-03-27: 3 g via INTRAVENOUS
  Filled 2019-03-27: qty 3000

## 2019-03-27 MED ORDER — HYDROCODONE-ACETAMINOPHEN 5-325 MG PO TABS
ORAL_TABLET | ORAL | Status: AC
  Filled 2019-03-27: qty 2

## 2019-03-27 SURGICAL SUPPLY — 43 items
APPLIER CLIP 9.375 MED OPEN (MISCELLANEOUS) ×2
BINDER BREAST LRG (GAUZE/BANDAGES/DRESSINGS) IMPLANT
BINDER BREAST XLRG (GAUZE/BANDAGES/DRESSINGS) IMPLANT
BINDER BREAST XXLRG (GAUZE/BANDAGES/DRESSINGS) ×1 IMPLANT
CANISTER SUCT 3000ML PPV (MISCELLANEOUS) ×2 IMPLANT
CHLORAPREP W/TINT 26 (MISCELLANEOUS) ×2 IMPLANT
CLIP APPLIE 9.375 MED OPEN (MISCELLANEOUS) ×1 IMPLANT
CONT SPEC 4OZ CLIKSEAL STRL BL (MISCELLANEOUS) ×2 IMPLANT
COVER PROBE W GEL 5X96 (DRAPES) ×2 IMPLANT
COVER SURGICAL LIGHT HANDLE (MISCELLANEOUS) ×2 IMPLANT
COVER WAND RF STERILE (DRAPES) ×2 IMPLANT
DERMABOND ADVANCED (GAUZE/BANDAGES/DRESSINGS) ×1
DERMABOND ADVANCED .7 DNX12 (GAUZE/BANDAGES/DRESSINGS) ×1 IMPLANT
DEVICE DUBIN SPECIMEN MAMMOGRA (MISCELLANEOUS) ×2 IMPLANT
DRAPE CHEST BREAST 15X10 FENES (DRAPES) ×2 IMPLANT
DRAPE HALF SHEET 40X57 (DRAPES) ×2 IMPLANT
DRSG PAD ABDOMINAL 8X10 ST (GAUZE/BANDAGES/DRESSINGS) ×2 IMPLANT
ELECT CAUTERY BLADE 6.4 (BLADE) ×2 IMPLANT
ELECT REM PT RETURN 9FT ADLT (ELECTROSURGICAL) ×2
ELECTRODE REM PT RTRN 9FT ADLT (ELECTROSURGICAL) ×1 IMPLANT
FILTER STRAW FLUID ASPIR (MISCELLANEOUS) IMPLANT
GAUZE SPONGE 4X4 12PLY STRL (GAUZE/BANDAGES/DRESSINGS) ×2 IMPLANT
GLOVE SS BIOGEL STRL SZ 7 (GLOVE) ×1 IMPLANT
GLOVE SUPERSENSE BIOGEL SZ 7 (GLOVE) ×1
GOWN STRL REUS W/ TWL LRG LVL3 (GOWN DISPOSABLE) ×1 IMPLANT
GOWN STRL REUS W/ TWL XL LVL3 (GOWN DISPOSABLE) ×1 IMPLANT
GOWN STRL REUS W/TWL LRG LVL3 (GOWN DISPOSABLE) ×1
GOWN STRL REUS W/TWL XL LVL3 (GOWN DISPOSABLE) ×1
KIT BASIN OR (CUSTOM PROCEDURE TRAY) ×2 IMPLANT
KIT MARKER MARGIN INK (KITS) ×2 IMPLANT
NDL 18GX1X1/2 (RX/OR ONLY) (NEEDLE) IMPLANT
NDL HYPO 25GX1X1/2 BEV (NEEDLE) ×1 IMPLANT
NEEDLE 18GX1X1/2 (RX/OR ONLY) (NEEDLE) IMPLANT
NEEDLE HYPO 25GX1X1/2 BEV (NEEDLE) ×2 IMPLANT
NS IRRIG 1000ML POUR BTL (IV SOLUTION) ×2 IMPLANT
PACK GENERAL/GYN (CUSTOM PROCEDURE TRAY) ×2 IMPLANT
SPONGE LAP 4X18 RFD (DISPOSABLE) ×2 IMPLANT
SUT MNCRL AB 4-0 PS2 18 (SUTURE) ×4 IMPLANT
SUT SILK 2 0 SH (SUTURE) ×2 IMPLANT
SUT VIC AB 3-0 SH 18 (SUTURE) ×2 IMPLANT
SYR CONTROL 10ML LL (SYRINGE) ×2 IMPLANT
TOWEL GREEN STERILE (TOWEL DISPOSABLE) ×2 IMPLANT
TOWEL GREEN STERILE FF (TOWEL DISPOSABLE) ×2 IMPLANT

## 2019-03-27 NOTE — Interval H&P Note (Signed)
History and Physical Interval Note:  03/27/2019 5:37 AM  Erica Giles  has presented today for surgery, with the diagnosis of LEFT BREAST CANCER.  The various methods of treatment have been discussed with the patient and family. After consideration of risks, benefits and other options for treatment, the patient has consented to  Procedure(s): LEFT BREAST LUMPECTOMY WITH RADIOACTIVE SEED AND LEFT AXILLARY DEEP SENTINEL LYMPH NODE BIOPSY INJECT BLUE DYE (Left) as a surgical intervention.  The patient's history has been reviewed, patient examined, no change in status, stable for surgery.  I have reviewed the patient's chart and labs.  Questions were answered to the patient's satisfaction.     Adin Hector

## 2019-03-27 NOTE — Transfer of Care (Signed)
Immediate Anesthesia Transfer of Care Note  Patient: Erica Giles  Procedure(s) Performed: LEFT BREAST LUMPECTOMY WITH RADIOACTIVE SEED AND LEFT AXILLARY DEEP SENTINEL LYMPH NODE BIOPSY INJECT BLUE DYE (Left )  Patient Location: PACU  Anesthesia Type:General  Level of Consciousness: awake  Airway & Oxygen Therapy: Patient Spontanous Breathing and Patient connected to face mask oxygen  Post-op Assessment: Report given to RN and Post -op Vital signs reviewed and stable  Post vital signs: Reviewed and stable  Last Vitals:  Vitals Value Taken Time  BP 128/75 03/27/19 0917  Temp    Pulse 84 03/27/19 0918  Resp 19 03/27/19 0918  SpO2 100 % 03/27/19 0918  Vitals shown include unvalidated device data.  Last Pain:  Vitals:   03/27/19 0641  TempSrc:   PainSc: 0-No pain      Patients Stated Pain Goal: 3 ([id number])  Complications: No apparent anesthesia complications

## 2019-03-27 NOTE — Anesthesia Procedure Notes (Signed)
Anesthesia Regional Block: Pectoralis block   Pre-Anesthetic Checklist: ,, timeout performed, Correct Patient, Correct Site, Correct Laterality, Correct Procedure, Correct Position, site marked, Risks and benefits discussed,  Surgical consent,  Pre-op evaluation,  At surgeon's request and post-op pain management  Laterality: Left and N/A  Prep: chloraprep       Needles:  Injection technique: Single-shot  Needle Type: Echogenic Stimulator Needle     Needle Length: 10cm  Needle Gauge: 21   Needle insertion depth: 4 cm   Additional Needles:   Procedures:,,,, ultrasound used (permanent image in chart),,,,  Narrative:  Start time: 03/27/2019 7:10 AM End time: 03/27/2019 7:30 AM Injection made incrementally with aspirations every 5 mL.  Performed by: Personally  Anesthesiologist: Lyn Hollingshead, MD

## 2019-03-27 NOTE — Discharge Instructions (Signed)
[facility] °Office Phone Number [phone] ° °BREAST BIOPSY/ PARTIAL MASTECTOMY: POST OP INSTRUCTIONS ° °Always review your discharge instruction sheet given to you by the facility where your surgery was performed. ° °IF YOU HAVE DISABILITY OR FAMILY LEAVE FORMS, YOU MUST BRING THEM TO THE OFFICE FOR PROCESSING.  DO NOT GIVE THEM TO YOUR DOCTOR. ° °1. A prescription for pain medication may be given to you upon discharge.  Take your pain medication as prescribed, if needed.  If narcotic pain medicine is not needed, then you may take acetaminophen (Tylenol) or ibuprofen (Advil) as needed. °2. Take your usually prescribed medications unless otherwise directed °3. If you need a refill on your pain medication, please contact your pharmacy.  They will contact our office to request authorization.  Prescriptions will not be filled after 5pm or on week-ends. °4. You should eat very light the first 24 hours after surgery, such as soup, crackers, pudding, etc.  Resume your normal diet the day after surgery. °5. Most patients will experience some swelling and bruising in the breast.  Ice packs and a good support bra will help.  Swelling and bruising can take several days to resolve.  °6. It is common to experience some constipation if taking pain medication after surgery.  Increasing fluid intake and taking a stool softener will usually help or prevent this problem from occurring.  A mild laxative (Milk of Magnesia or Miralax) should be taken according to package directions if there are no bowel movements after 48 hours. °7. Unless discharge instructions indicate otherwise, you may remove your bandages 24-48 hours after surgery, and you may shower at that time.  You may have steri-strips (small skin tapes) in place directly over the incision.  These strips should be left on the skin for 7-10 days.  If your surgeon used skin glue on the incision, you may shower in 24 hours.  The glue will flake off over the  next 2-3 weeks.  Any sutures or staples will be removed at the office during your follow-up visit. °8. ACTIVITIES:  You may resume regular daily activities (gradually increasing) beginning the next day.  Wearing a good support bra or sports bra minimizes pain and swelling.  You may have sexual intercourse when it is comfortable. °a. You may drive when you no longer are taking prescription pain medication, you can comfortably wear a seatbelt, and you can safely maneuver your car and apply brakes. °b. RETURN TO WORK:  ______________________________________________________________________________________ °9. You should see your doctor in the office for a follow-up appointment approximately two weeks after your surgery.  Your doctor’s nurse will typically make your follow-up appointment when she calls you with your pathology report.  Expect your pathology report 2-3 business days after your surgery.  You may call to check if you do not hear from us after three days. °10. OTHER INSTRUCTIONS: _______________________________________________________________________________________________ _____________________________________________________________________________________________________________________________________ °_____________________________________________________________________________________________________________________________________ °_____________________________________________________________________________________________________________________________________ ° °WHEN TO CALL YOUR DOCTOR: °1. Fever over 101.0 °2. Nausea and/or vomiting. °3. Extreme swelling or bruising. °4. Continued bleeding from incision. °5. Increased pain, redness, or drainage from the incision. ° °The clinic staff is available to answer your questions during regular business hours.  Please don’t hesitate to call and ask to speak to one of the nurses for clinical concerns.  If you have a medical emergency, go to the nearest  emergency room or call 911.  A surgeon from [facility] is always on call at the hospital. ° °For further questions, please visit [email or web address]  ° ° ° ° ° ° ° ° ° ° °••••••••• ° ° °  Managing Your Pain After Surgery Without Opioids ° ° ° °Thank you for participating in our program to help patients manage their pain after surgery without opioids. This is part of our effort to provide you with the best care possible, without exposing you or your family to the risk that opioids pose. ° °What pain can I expect after surgery? °You can expect to have some pain after surgery. This is normal. The pain is typically worse the day after surgery, and quickly begins to get better. °Many studies have found that many patients are able to manage their pain after surgery with Over-the-Counter (OTC) medications such as Tylenol and Motrin. If you have a condition that does not allow you to take Tylenol or Motrin, notify your surgical team. ° °How will I manage my pain? °The best strategy for controlling your pain after surgery is around the clock pain control with Tylenol (acetaminophen) and Motrin (ibuprofen or Advil). Alternating these medications with each other allows you to maximize your pain control. In addition to Tylenol and Motrin, you can use heating pads or ice packs on your incisions to help reduce your pain. ° °How will I alternate your regular strength over-the-counter pain medication? °You will take a dose of pain medication every three hours. °; Start by taking 650 mg of Tylenol (2 pills of 325 mg) °; 3 hours later take 600 mg of Motrin (3 pills of 200 mg) °; 3 hours after taking the Motrin take 650 mg of Tylenol °; 3 hours after that take 600 mg of Motrin. ° ° °- 1 - ° °See example - if your first dose of Tylenol is at 12:00 PM ° ° °12:00 PM Tylenol 650 mg (2 pills of 325 mg)  °3:00 PM Motrin 600 mg (3 pills of 200 mg)  °6:00 PM Tylenol 650 mg (2 pills of 325 mg)  °9:00 PM Motrin 600 mg (3  pills of 200 mg)  °Continue alternating every 3 hours  ° °We recommend that you follow this schedule around-the-clock for at least 3 days after surgery, or until you feel that it is no longer needed. Use the table on the last page of this handout to keep track of the medications you are taking. °Important: °Do not take more than 3000mg of Tylenol or 3200mg of Motrin in a 24-hour period. °Do not take ibuprofen/Motrin if you have a history of bleeding stomach ulcers, severe kidney disease, &/or actively taking a blood thinner ° °What if I still have pain? °If you have pain that is not controlled with the over-the-counter pain medications (Tylenol and Motrin or Advil) you might have what we call “breakthrough” pain. You will receive a prescription for a small amount of an opioid pain medication such as Oxycodone, Tramadol, or Tylenol with Codeine. Use these opioid pills in the first 24 hours after surgery if you have breakthrough pain. Do not take more than 1 pill every 4-6 hours. ° °If you still have uncontrolled pain after using all opioid pills, don't hesitate to call our staff using the number provided. We will help make sure you are managing your pain in the best way possible, and if necessary, we can provide a prescription for additional pain medication. ° ° °Day 1   ° °Time  °Name of Medication Number of pills taken  °Amount of Acetaminophen  °Pain Level  ° °Comments  °AM PM       °AM PM       °AM PM       °  AM PM       °AM PM       °AM PM       °AM PM       °AM PM       °Total Daily amount of Acetaminophen °Do not take more than  3,000 mg per day    ° ° °Day 2   ° °Time  °Name of Medication Number of pills °taken  °Amount of Acetaminophen  °Pain Level  ° °Comments  °AM PM       °AM PM       °AM PM       °AM PM       °AM PM       °AM PM       °AM PM       °AM PM       °Total Daily amount of Acetaminophen °Do not take more than  3,000 mg per day    ° ° °Day 3   ° °Time  °Name of Medication Number of pills taken    °Amount of Acetaminophen  °Pain Level  ° °Comments  °AM PM       °AM PM       °AM PM       °AM PM       ° ° ° °AM PM       °AM PM       °AM PM       °AM PM       °Total Daily amount of Acetaminophen °Do not take more than  3,000 mg per day    ° ° °Day 4   ° °Time  °Name of Medication Number of pills taken  °Amount of Acetaminophen  °Pain Level  ° °Comments  °AM PM       °AM PM       °AM PM       °AM PM       °AM PM       °AM PM       °AM PM       °AM PM       °Total Daily amount of Acetaminophen °Do not take more than  3,000 mg per day    ° ° °Day 5   ° °Time  °Name of Medication Number °of pills taken  °Amount of Acetaminophen  °Pain Level  ° °Comments  °AM PM       °AM PM       °AM PM       °AM PM       °AM PM       °AM PM       °AM PM       °AM PM       °Total Daily amount of Acetaminophen °Do not take more than  3,000 mg per day    ° ° ° °Day 6   ° °Time  °Name of Medication Number of pills °taken  °Amount of Acetaminophen  °Pain Level  °Comments  °AM PM       °AM PM       °AM PM       °AM PM       °AM PM       °AM PM       °AM PM       °AM PM       °Total Daily amount of Acetaminophen °Do not take more than    3,000 mg per day    ° ° °Day 7   ° °Time  °Name of Medication Number of pills taken  °Amount of Acetaminophen  °Pain Level  ° °Comments  °AM PM       °AM PM       °AM PM       °AM PM       °AM PM       °AM PM       °AM PM       °AM PM       °Total Daily amount of Acetaminophen °Do not take more than  3,000 mg per day    ° ° ° ° °For additional information about how and where to safely dispose of unused opioid °medications - https://www.morepowerfulnc.org ° °Disclaimer: This document contains information and/or instructional materials adapted from [facility] for the typical patient with your condition. It does not replace medical advice from your health care provider because your experience may differ from that of the °typical patient. Talk to your health care provider if you have any questions about  this °document, your condition or your treatment plan. °Adapted from [facility] ° °

## 2019-03-27 NOTE — Anesthesia Procedure Notes (Signed)
Procedure Name: LMA Insertion Date/Time: 03/27/2019 7:54 AM Performed by: Lieutenant Diego, CRNA Pre-anesthesia Checklist: Patient identified, Emergency Drugs available, Suction available and Patient being monitored Patient Re-evaluated:Patient Re-evaluated prior to induction Oxygen Delivery Method: Circle system utilized Preoxygenation: Pre-oxygenation with 100% oxygen Induction Type: IV induction Ventilation: Mask ventilation without difficulty LMA: LMA inserted LMA Size: 4.0 Number of attempts: 1 Placement Confirmation: positive ETCO2 and breath sounds checked- equal and bilateral Tube secured with: Tape Dental Injury: Teeth and Oropharynx as per pre-operative assessment

## 2019-03-27 NOTE — Op Note (Addendum)
Patient Name:           Erica Giles   Date of Surgery:        03/27/2019  Pre op Diagnosis:      Invasive ductal carcinoma left breast, upper outer quadrant, receptor positive  Post op Diagnosis:    Same  Procedure:                 Inject blue dye left wrist                                      Left breast lumpectomy with radioactive seed localization                                      Left axillary deep sentinel lymph node biopsy  Surgeon:                     Edsel Petrin. Dalbert Batman, M.D., FACS  Assistant:                      OR staff  Operative Indications:   This is a pleasant 63 year old female from [place].. She is referred by Rory Percy at [facility] for evaluation of new cancer left breast. Ammie Ferrier and [facility] performed her imaging studies. Delrae Rend is her endocrinologist. Dr. Delton Coombes will be her medical oncologist at [facility].      She has no prior history of breast disease. She gets annual screening mammograms. Recent screening mammograms and follow-up imaging showing a 9 mm spiculated mass in the lateral left breast at about the 2:30 position, 7 cm from the nipple. Axillary ultrasound negative. Image guided biopsy of the left breast mass, upper outer quadrant shows grade 2 invasive ductal carcinoma and DCIS.  Receptor strongly positive.  HER-2 negative.      Comorbidities include insulin-dependent diabetes. Hypertension. Hiatal hernia. Obstructive sleep apnea uses CPAP. GERD. Remote history of open cholecystectomy. Left humerus fracture managed by Dr. Tonita Cong, Abdominal hysterectomy for benign disease. Family history significant for 3 paternal aunt treated for breast cancer. Otherwise no history of ovarian cancer pancreatic cancer or prostate cancer We had a long discussion. We talked about surgical management of her breast cancer.       The options of lumpectomy, sentinel node biopsy, radiation therapy. We talked about  mastectomy with or without reconstruction. She is strongly motivated for breast conservation surgery and I think she is an excellent candidate for that due to the small size of her tumor.       We will begin scheduling for left breast lumpectomy with radioactive seed localization, inject blue dye left breast, left axillary deep sentinel lymph node biopsy  She agrees with this plan. Presented in breast conference. Consensus recommendations was breast conservation surgery with left lumpectomy and sentinel node biopsy Likely whole breast radiation therapy to be followed by anti-estrogens Genetic testing to be considered due to 3 paternal aunts with breast cancer She saw Dr. Delton Coombes at [facility] on September 29, and his recommendations were consistent with the conference recommendations  Operative Findings:       The small cancer was present in the upper outer quadrant of the left breast at about the 2 o'clock position.  Specimen mammogram looked very good  with the marker clip and the radioactive seed very close to 1 another in the center of the specimen.  They look like there was a good margin all around.  We found several sentinel lymph nodes.  The technetium did not map very well but the blue dye map very well and we were able to trace out a few very blue lymph nodes, some of which were enlarged.  Procedure in Detail:          Following the induction of general LMA anesthesia a surgical timeout was performed.  Intravenous antibiotics were given.  Following alcohol prep I injected 5 cc of dilute methylene blue into the left breast, subareolar area, and massaged the breast for a few minutes.  The entire left chest wall was then prepped and draped in a sterile fashion.  0.25% Marcaine with epinephrine was used as a local infiltration anesthetic.      Using the neoprobe identified the area of radioactivity in the upper outer, fairly lateral left breast.  I made a curvilinear incision in  the lateral left breast with a knife.  Lumpectomy was performed with the neoprobe and electrocautery.  The specimen was removed and marked with silk sutures and a 6 color ink kit.  The specimen mammogram looked very good as described above.  The specimen was sent to the lab where the seed was retrieved.  Hemostasis was excellent.  The wound was irrigated.  5 metal marker clips were placed in the walls of the lumpectomy cavity.  Lumpectomy cavity was closed in layers with interrupted 3-0 Vicryl and the skin was closed with subcuticular 4-0 Monocryl and Dermabond.       I made an incision in the left axilla at the hairline at a skin crease.  Dissection was carried down through the clavipectoral fascia.  There was some radioactivity but it was not very strong.  I was able to find 3 different blue lymphatics and I traced these out and found lymph nodes that were blue and sent those as a single specimen.  Hemostasis was excellent and achieved with electrocautery and metal clips.  The wound was irrigated.  The clavipectoral fascia was closed with 3-0 Vicryl and the skin closed with a running subcuticular 4-0 Monocryl and Dermabond.  Dry bandages and a breast binder were placed.  The patient tolerated the procedure well and was taken to PACU in stable condition.  EBL 25 cc.  Counts correct.  Complications none.    Addendum: I logged onto the PMP aware website and reviewed her prescription medication history     Elyse Prevo M. Dalbert Batman, M.D., FACS General and Minimally Invasive Surgery Breast and Colorectal Surgery  03/27/2019 9:15 AM

## 2019-03-27 NOTE — Anesthesia Postprocedure Evaluation (Signed)
Anesthesia Post Note  Patient: Erica Giles  Procedure(s) Performed: LEFT BREAST LUMPECTOMY WITH RADIOACTIVE SEED AND LEFT AXILLARY DEEP SENTINEL LYMPH NODE BIOPSY INJECT BLUE DYE (Left )     Patient location during evaluation: PACU Anesthesia Type: General Level of consciousness: awake Pain management: pain level controlled Vital Signs Assessment: post-procedure vital signs reviewed and stable Respiratory status: spontaneous breathing Cardiovascular status: stable Postop Assessment: no apparent nausea or vomiting Anesthetic complications: no    Last Vitals:  Vitals:   03/27/19 0925 03/27/19 0930  BP:  (!) 150/74  Pulse: 82 78  Resp: 20 13  Temp:    SpO2: 93% 93%    Last Pain:  Vitals:   03/27/19 0920  TempSrc:   PainSc: Asleep   Pain Goal: Patients Stated Pain Goal: 3 (03/27/19 0641)                 Huston Foley

## 2019-03-28 ENCOUNTER — Encounter (HOSPITAL_COMMUNITY): Payer: Self-pay | Admitting: General Surgery

## 2019-04-02 LAB — SURGICAL PATHOLOGY

## 2019-04-03 ENCOUNTER — Encounter (HOSPITAL_COMMUNITY): Payer: Self-pay | Admitting: *Deleted

## 2019-04-03 DIAGNOSIS — E782 Mixed hyperlipidemia: Secondary | ICD-10-CM | POA: Diagnosis not present

## 2019-04-03 DIAGNOSIS — Z008 Encounter for other general examination: Secondary | ICD-10-CM | POA: Diagnosis not present

## 2019-04-03 DIAGNOSIS — Z719 Counseling, unspecified: Secondary | ICD-10-CM | POA: Diagnosis not present

## 2019-04-03 DIAGNOSIS — E559 Vitamin D deficiency, unspecified: Secondary | ICD-10-CM | POA: Diagnosis not present

## 2019-04-03 NOTE — Progress Notes (Signed)
Oncotype Dx discussed with patient per Dr. Delton Coombes and patient wishes to proceed with testing.  I have ordered oncotype DX today and results should be back within 10-14 business days.

## 2019-04-09 ENCOUNTER — Other Ambulatory Visit (HOSPITAL_COMMUNITY): Payer: Self-pay | Admitting: Nurse Practitioner

## 2019-04-14 ENCOUNTER — Encounter: Payer: Self-pay | Admitting: Hematology

## 2019-04-14 DIAGNOSIS — Z170 Estrogen receptor positive status [ER+]: Secondary | ICD-10-CM | POA: Diagnosis not present

## 2019-04-14 DIAGNOSIS — C50412 Malignant neoplasm of upper-outer quadrant of left female breast: Secondary | ICD-10-CM | POA: Diagnosis not present

## 2019-04-19 ENCOUNTER — Other Ambulatory Visit: Payer: Self-pay

## 2019-04-19 ENCOUNTER — Ambulatory Visit (HOSPITAL_COMMUNITY)
Admission: RE | Admit: 2019-04-19 | Discharge: 2019-04-19 | Disposition: A | Discharge status: Home / Self Care | Payer: BC Managed Care – PPO | Source: Ambulatory Visit | Attending: Hematology | Admitting: Hematology

## 2019-04-19 DIAGNOSIS — Z780 Asymptomatic menopausal state: Secondary | ICD-10-CM | POA: Diagnosis not present

## 2019-04-19 DIAGNOSIS — M85832 Other specified disorders of bone density and structure, left forearm: Secondary | ICD-10-CM | POA: Diagnosis not present

## 2019-04-23 DIAGNOSIS — E1165 Type 2 diabetes mellitus with hyperglycemia: Secondary | ICD-10-CM | POA: Diagnosis not present

## 2019-04-23 DIAGNOSIS — M940 Chondrocostal junction syndrome [Tietze]: Secondary | ICD-10-CM | POA: Diagnosis not present

## 2019-04-23 DIAGNOSIS — R079 Chest pain, unspecified: Secondary | ICD-10-CM | POA: Diagnosis not present

## 2019-04-23 DIAGNOSIS — Z833 Family history of diabetes mellitus: Secondary | ICD-10-CM | POA: Diagnosis not present

## 2019-04-23 DIAGNOSIS — E669 Obesity, unspecified: Secondary | ICD-10-CM | POA: Diagnosis not present

## 2019-04-23 DIAGNOSIS — C50919 Malignant neoplasm of unspecified site of unspecified female breast: Secondary | ICD-10-CM | POA: Diagnosis not present

## 2019-04-24 ENCOUNTER — Encounter (HOSPITAL_COMMUNITY): Payer: Self-pay | Admitting: Hematology

## 2019-04-24 ENCOUNTER — Inpatient Hospital Stay (HOSPITAL_COMMUNITY): Payer: BC Managed Care – PPO | Attending: Hematology | Admitting: Hematology

## 2019-04-24 ENCOUNTER — Other Ambulatory Visit: Payer: Self-pay

## 2019-04-24 VITALS — BP 144/57 | HR 77 | Temp 97.9°F | Resp 18 | Wt 230.0 lb

## 2019-04-24 DIAGNOSIS — C50412 Malignant neoplasm of upper-outer quadrant of left female breast: Secondary | ICD-10-CM | POA: Diagnosis not present

## 2019-04-24 DIAGNOSIS — R5383 Other fatigue: Secondary | ICD-10-CM | POA: Diagnosis not present

## 2019-04-24 DIAGNOSIS — Z87891 Personal history of nicotine dependence: Secondary | ICD-10-CM | POA: Insufficient documentation

## 2019-04-24 DIAGNOSIS — Z170 Estrogen receptor positive status [ER+]: Secondary | ICD-10-CM

## 2019-04-24 DIAGNOSIS — R200 Anesthesia of skin: Secondary | ICD-10-CM | POA: Insufficient documentation

## 2019-04-24 MED ORDER — ANASTROZOLE 1 MG PO TABS: 1.0000 mg | ORAL_TABLET | Freq: Every day | ORAL | 3 refills | Status: DC

## 2019-04-24 NOTE — Progress Notes (Signed)
Erica Giles, [place] [id number]   CLINIC:  Medical Oncology/Hematology  PCP:  Erica Percy, MD [place] [address] [phone]   REASON FOR VISIT:  Follow-up for left breast cancer.  CURRENT THERAPY: Anastrozole and radiation therapy.  BRIEF ONCOLOGIC HISTORY:  Oncology History  Breast cancer of upper-outer quadrant of left female breast ([place])  03/06/2019 Initial Diagnosis   Breast cancer of upper-outer quadrant of left female breast ([place])   04/24/2019 Cancer Staging   Staging form: Breast, AJCC 8th Edition - Clinical: Stage IB (cT1c, cN1, cM0, G2, ER+, PR+, HER2-) - Signed by Erica Jack, MD on 04/24/2019      CANCER STAGING: Cancer Staging Breast cancer of upper-outer quadrant of left female breast [facility]) Staging form: Breast, AJCC 8th Edition - Clinical: Stage IB (cT1c, cN1, cM0, G2, ER+, PR+, HER2-) - Signed by Erica Jack, MD on 04/24/2019    INTERVAL HISTORY:  Erica Giles 63 y.o. female seen for follow-up of left breast cancer.  She underwent lumpectomy and sentinel lymph node biopsy by Erica Giles on 03/27/2019.  She has been recovering well from surgery.  She reportedly takes vitamin D 2000 units daily.  She is not taking calcium supplements.  Reports 100% appetite levels and 50% energy levels.  Minor pain rated as 2 out of 0 at the surgical site.  Numbness in the feet has been stable.  Mild fatigue present.    REVIEW OF SYSTEMS:  Review of Systems  Constitutional: Positive for fatigue.  Neurological: Positive for numbness.  All other systems reviewed and are negative.    PAST MEDICAL/SURGICAL HISTORY:  Past Medical History:  Diagnosis Date  . Anxiety   . Arthritis   . Depression   . Diabetes mellitus    ORAL MED  . GERD (gastroesophageal reflux disease)   . Helicobacter pylori gastritis   . Hiatal hernia   . HNP (herniated nucleus pulposus), lumbar    PT TRYING TO AVOID LUMBAR  SURGERY--STATES PAIN IN HER BACK AND SOMETIMES DOWN ONE OR THE OTHER LEG  . Hypertension   . Plantar fasciitis    BILATERAL  . Schatzki's ring   . Shortness of breath    WITH EXERTION-PT RELATES TO HER WEIGHT  . Sleep apnea    MODERATE- PER SLEEP STUDY 06/2010--PT USES CPAP-SETTING IS 11   Past Surgical History:  Procedure Laterality Date  . ABDOMINAL HYSTERECTOMY    . BREAST LUMPECTOMY WITH RADIOACTIVE SEED AND SENTINEL LYMPH NODE BIOPSY Left 03/27/2019   Procedure: LEFT BREAST LUMPECTOMY WITH RADIOACTIVE SEED AND LEFT AXILLARY DEEP SENTINEL LYMPH NODE BIOPSY INJECT BLUE DYE;  Surgeon: Fanny Skates, MD;  Location: [facility];  Service: General;  Laterality: Left;  . CARPAL TUNNEL RELEASE     BILATERAL  . CHOLECYSTECTOMY    . CYST REMOVED     LEFT THUMB AND RT MIDDLE FINGER  . ESOPHAGECTOMY    . TONSILLECTOMY    . TRIGGER FINGER REPAIR       SOCIAL HISTORY:  Social History   Socioeconomic History  . Marital status: Married    Spouse name: Not on file  . Number of children: 2  . Years of education: Not on file  . Highest education level: Not on file  Occupational History  . Occupation: Scientist, product/process development: [employer]  Social Needs  . Financial resource strain: Somewhat hard  . Food insecurity    Worry: Never true  Inability: Never true  . Transportation needs    Medical: No    Non-medical: No  Tobacco Use  . Smoking status: Former Smoker    Packs/day: 1.00    Years: 25.00    Pack years: 25.00    Quit date: 03/05/2008    Years since quitting: 11.1  . Smokeless tobacco: Never Used  . Tobacco comment: 03/2008  Substance and Sexual Activity  . Alcohol use: No  . Drug use: No  . Sexual activity: Not on file    Comment: hysterectomy  Lifestyle  . Physical activity    Days per week: 0 days    Minutes per session: 0 min  . Stress: To some extent  Relationships  . Social Herbalist on phone: Three times a week    Gets together: Once a week     Attends religious service: More than 4 times per year    Active member of club or organization: Yes    Attends meetings of clubs or organizations: More than 4 times per year    Relationship status: Married  . Intimate partner violence    Fear of current or ex partner: No    Emotionally abused: No    Physically abused: No    Forced sexual activity: No  Other Topics Concern  . Not on file  Social History Narrative  . Not on file    FAMILY HISTORY:  Family History  Problem Relation Age of Onset  . Diabetes Mother   . Kidney disease Mother   . Diabetes Father   . Kidney Stones Sister   . Diabetes Brother   . Kidney Stones Brother   . Scoliosis Son     CURRENT MEDICATIONS:  Outpatient Encounter Medications as of 04/24/2019  Medication Sig  . atorvastatin (LIPITOR) 40 MG tablet Take 40 mg by mouth at bedtime.   . cholecalciferol (VITAMIN D3) 25 MCG (1000 UT) tablet Take 2,000-4,000 Units by mouth See admin instructions. 2000 mcg daily, except once a week, take 4000 mcg daily  . cyanocobalamin (,VITAMIN B-12,) 1000 MCG/ML injection Inject 1,000 mcg into the muscle every 30 (thirty) days.   . DULoxetine (CYMBALTA) 60 MG capsule Take 60 mg by mouth daily.  . Empagliflozin-metFORMIN HCl ER (SYNJARDY XR) 12.10-998 MG TB24 Take 2 tablets by mouth every morning.   Marland Kitchen glipiZIDE (GLUCOTROL XL) 10 MG 24 hr tablet Take 10 mg by mouth daily.  . insulin aspart protamine- aspart (NOVOLOG MIX 70/30) (70-30) 100 UNIT/ML injection Inject 48 Units into the skin 2 (two) times daily with a meal.   . lisinopril (PRINIVIL,ZESTRIL) 20 MG tablet Take 20 mg by mouth at bedtime.  . meloxicam (MOBIC) 15 MG tablet Take 15 mg by mouth daily.  . pantoprazole (PROTONIX) 40 MG tablet Take 1 tablet (40 mg total) by mouth 2 (two) times daily.  . SYRINGE-NEEDLE, DISP, 3 ML (B-D 3CC LUER-LOK SYR 25GX5/8") 25G X 5/8" 3 ML MISC BD Luer-Lok Syringe 3 mL 25 x 5/8"  USE TO INJECT VITAMIN B12 ONCE A MONTH.  . vitamin  B-12 (CYANOCOBALAMIN) 1000 MCG tablet Take 1,000 mcg by mouth daily.  . Vitamin D, Ergocalciferol, (DRISDOL) 1.25 MG (50000 UT) CAPS capsule Take 50,000 Units by mouth once a week.  Marland Kitchen anastrozole (ARIMIDEX) 1 MG tablet Take 1 tablet (1 mg total) by mouth daily.  Marland Kitchen gabapentin (NEURONTIN) 300 MG capsule Take 900 mg by mouth 3 (three) times daily as needed (pain).   Marland Kitchen HYDROcodone-acetaminophen ([place])  5-325 MG tablet Take 1-2 tablets by mouth every 6 (six) hours as needed for moderate pain or severe pain. (Patient not taking: Reported on 04/24/2019)  . [DISCONTINUED] NOVOLIN 70/30 RELION (70-30) 100 UNIT/ML injection SMARTSIG:40 Unit(s) SUB-Q Twice Daily   No facility-administered encounter medications on file as of 04/24/2019.     ALLERGIES:  Allergies  Allergen Reactions  . Hydrocodone Itching    PT WOULD TAKE WITH BENADRYL  . Percocet [Oxycodone-Acetaminophen] Itching    PT CAN TAKE WITH BENADRYL     PHYSICAL EXAM:  ECOG Performance status: 1  Vitals:   04/24/19 1440  BP: (!) 144/57  Pulse: 77  Resp: 18  Temp: 97.9 F (36.6 C)  SpO2: 97%   Filed Weights   04/24/19 1440  Weight: 230 lb (104.3 kg)    Physical Exam Vitals signs reviewed.  Constitutional:      Appearance: Normal appearance.  Cardiovascular:     Rate and Rhythm: Normal rate and regular rhythm.     Heart sounds: Normal heart sounds.  Pulmonary:     Effort: Pulmonary effort is normal.     Breath sounds: Normal breath sounds.  Abdominal:     General: There is no distension.     Palpations: Abdomen is soft. There is no mass.  Musculoskeletal:        General: No swelling.  Skin:    General: Skin is warm.  Neurological:     General: No focal deficit present.     Mental Status: She is alert and oriented to person, place, and time.  Psychiatric:        Mood and Affect: Mood normal.        Behavior: Behavior normal.    Lumpectomy site is healing well.  LABORATORY DATA:  I have reviewed the labs as  listed.  CBC    Component Value Date/Time   WBC 6.2 03/22/2019 1008   RBC 3.90 03/22/2019 1008   HGB 12.0 03/22/2019 1008   HCT 36.5 03/22/2019 1008   PLT 208 03/22/2019 1008   MCV 93.6 03/22/2019 1008   MCH 30.8 03/22/2019 1008   MCHC 32.9 03/22/2019 1008   RDW 13.5 03/22/2019 1008   LYMPHSABS 2.3 03/22/2019 1008   MONOABS 0.4 03/22/2019 1008   EOSABS 0.1 03/22/2019 1008   BASOSABS 0.0 03/22/2019 1008   CMP Latest Ref Rng & Units 03/22/2019 02/22/2014 06/29/2011  Glucose 70 - 99 mg/dL 132(H) 144(H) 116(H)  BUN 8 - 23 mg/dL '17 15 13  ' Creatinine 0.44 - 1.00 mg/dL 1.13(H) 1.1 0.99  Sodium 135 - 145 mmol/L 141 138 137  Potassium 3.5 - 5.1 mmol/L 4.2 4.1 4.1  Chloride 98 - 111 mmol/L 107 105 101  CO2 22 - 32 mmol/L '26 23 27  ' Calcium 8.9 - 10.3 mg/dL 9.0 9.1 9.8  Total Protein 6.5 - 8.1 g/dL 6.3(L) 7.5 -  Total Bilirubin 0.3 - 1.2 mg/dL 0.5 0.3 -  Alkaline Phos 38 - 126 U/L 92 81 -  AST 15 - 41 U/L 11(L) 20 -  ALT 0 - 44 U/L 17 26 -       DIAGNOSTIC IMAGING:  I have independently reviewed the scans and discussed with the patient.   ASSESSMENT & PLAN:   Breast cancer of upper-outer quadrant of left female breast ([place]) 1.  Stage Ib (T1 cN1 M0) left breast IDC: -Screening mammogram on 02/16/2019 showed suspicious 0.9 cm mass in 2:30-3 o'clock position of the left breast. -Ultrasound-guided biopsy on 02/20/2019 consistent with  grade 2 IDC, ER/PR 100% positive, HER-2 negative, Ki-67 1%. -Lumpectomy and sentinel lymph node biopsy by Erica Giles on 03/27/2019 shows grade 2 IDC, 1.1 cm, negative margins, negative LVSI/perineural invasion, 1 out of 1 lymph node positive for metastatic carcinoma measuring 2.7 mm. -She is recovering well from surgery. -I discussed Oncotype DX results which showed a recurrence score of 8.  There is no apparent benefit from chemotherapy.  9-year breast cancer specific survival is 98%. -Based on these results, we did not recommend any further adjuvant  chemotherapy. -We reviewed her bone density test dated 04/19/2019 which shows T score of -1. -I have recommended anastrozole 1 mg daily for at least 5 years.  We talked about side effects in detail including but not limited to hot flashes, decreased bone mineral density, musculoskeletal symptoms, rare chance of thromboembolic phenomenon.  She understands and gives [place] permission to proceed with this therapy. -We have made a referral to for her to see EricaYanagihara. -I will see her back in 3 months for follow-up to see how she is tolerating her pills.  We will check her vitamin D level at that time. -She is currently taking vitamin D 2000 units.  I have asked her to take calcium 1200 mg daily. -She was also counseled to start doing exercises for 30 minutes a day, most days of the week.  2.  Family history: -3 paternal aunts had breast cancer.  1 maternal aunt also recently diagnosed with breast cancer. -I have recommended germline mutation testing.  I will make referral to our geneticist.   Total time spent is 25 minutes with more than 50% of the time spent face-to-face discussing pathology reports, test results, treatment plan, counseling and coordination of care.    Orders placed this encounter:  No orders of the defined types were placed in this encounter.     Erica Jack, MD [place] [phone]

## 2019-04-24 NOTE — Patient Instructions (Signed)
[place] at [facility] Discharge Instructions  You were seen today by Dr. Delton Coombes. He went over your recent test results. According to the test results you will not have to have chemotherapy and will be able to do radiation therapy and a cancer pill to treat your cancer. Please start taking Calcium 1200mg  daily. He is going to start you on Arimidex(anastrazole), you should take this pill every day around the same time. You will need to take this pill every day for at least 5 years. You will continue mammograms every year and a repeat bone density test in 2 years. He will see you back in 3 months for labs and follow up.   Thank you for choosing [place] at [facility] to provide your oncology and hematology care.  To afford each patient quality time with our provider, please arrive at least 15 minutes before your scheduled appointment time.   If you have a lab appointment with the [facility] please come in thru the  Main Entrance and check in at the main information desk  You need to re-schedule your appointment should you arrive 10 or more minutes late.  We strive to give you quality time with our providers, and arriving late affects you and other patients whose appointments are after yours.  Also, if you no show three or more times for appointments you may be dismissed from the clinic at the providers discretion.     Again, thank you for choosing [facility].  Our hope is that these requests will decrease the amount of time that you wait before being seen by our physicians.       _____________________________________________________________  Should you have questions after your visit to [facility], please contact our office at [phone] between the hours of 8:00 a.m. and 4:30 p.m.  Voicemails left after 4:00 p.m. will not be returned until the following business day.  For prescription refill requests, have your  pharmacy contact our office and allow 72 hours.    Cancer Center Support Programs:   > Cancer Support Group  2nd Tuesday of the month 1pm-2pm, Journey Room

## 2019-04-24 NOTE — Assessment & Plan Note (Signed)
1.  Stage Ib (T1 cN1 M0) left breast IDC: -Screening mammogram on 02/16/2019 showed suspicious 0.9 cm mass in 2:30-3 o'clock position of the left breast. -Ultrasound-guided biopsy on 02/20/2019 consistent with grade 2 IDC, ER/PR 100% positive, HER-2 negative, Ki-67 1%. -Lumpectomy and sentinel lymph node biopsy by Dr. Dalbert Batman on 03/27/2019 shows grade 2 IDC, 1.1 cm, negative margins, negative LVSI/perineural invasion, 1 out of 1 lymph node positive for metastatic carcinoma measuring 2.7 mm. -She is recovering well from surgery. -I discussed Oncotype DX results which showed a recurrence score of 8.  There is no apparent benefit from chemotherapy.  9-year breast cancer specific survival is 98%. -Based on these results, we did not recommend any further adjuvant chemotherapy. -We reviewed her bone density test dated 04/19/2019 which shows T score of -1. -I have recommended anastrozole 1 mg daily for at least 5 years.  We talked about side effects in detail including but not limited to hot flashes, decreased bone mineral density, musculoskeletal symptoms, rare chance of thromboembolic phenomenon.  She understands and gives [place] permission to proceed with this therapy. -We have made a referral to for her to see Dr.Yanagihara. -I will see her back in 3 months for follow-up to see how she is tolerating her pills.  We will check her vitamin D level at that time. -She is currently taking vitamin D 2000 units.  I have asked her to take calcium 1200 mg daily. -She was also counseled to start doing exercises for 30 minutes a day, most days of the week.  2.  Family history: -3 paternal aunts had breast cancer.  1 maternal aunt also recently diagnosed with breast cancer. -I have recommended germline mutation testing.  I will make referral to our geneticist.

## 2019-04-25 DIAGNOSIS — C50912 Malignant neoplasm of unspecified site of left female breast: Secondary | ICD-10-CM | POA: Diagnosis not present

## 2019-05-01 DIAGNOSIS — C773 Secondary and unspecified malignant neoplasm of axilla and upper limb lymph nodes: Secondary | ICD-10-CM | POA: Diagnosis not present

## 2019-05-01 DIAGNOSIS — E119 Type 2 diabetes mellitus without complications: Secondary | ICD-10-CM | POA: Diagnosis not present

## 2019-05-01 DIAGNOSIS — Z87891 Personal history of nicotine dependence: Secondary | ICD-10-CM | POA: Diagnosis not present

## 2019-05-01 DIAGNOSIS — E7800 Pure hypercholesterolemia, unspecified: Secondary | ICD-10-CM | POA: Diagnosis not present

## 2019-05-01 DIAGNOSIS — Z9889 Other specified postprocedural states: Secondary | ICD-10-CM | POA: Diagnosis not present

## 2019-05-01 DIAGNOSIS — F329 Major depressive disorder, single episode, unspecified: Secondary | ICD-10-CM | POA: Diagnosis not present

## 2019-05-01 DIAGNOSIS — Z170 Estrogen receptor positive status [ER+]: Secondary | ICD-10-CM | POA: Diagnosis not present

## 2019-05-01 DIAGNOSIS — I10 Essential (primary) hypertension: Secondary | ICD-10-CM | POA: Diagnosis not present

## 2019-05-01 DIAGNOSIS — C50412 Malignant neoplasm of upper-outer quadrant of left female breast: Secondary | ICD-10-CM | POA: Diagnosis not present

## 2019-05-10 DIAGNOSIS — Z9889 Other specified postprocedural states: Secondary | ICD-10-CM | POA: Diagnosis not present

## 2019-05-10 DIAGNOSIS — C50412 Malignant neoplasm of upper-outer quadrant of left female breast: Secondary | ICD-10-CM | POA: Diagnosis not present

## 2019-05-10 DIAGNOSIS — Z87891 Personal history of nicotine dependence: Secondary | ICD-10-CM | POA: Diagnosis not present

## 2019-05-10 DIAGNOSIS — Z170 Estrogen receptor positive status [ER+]: Secondary | ICD-10-CM | POA: Diagnosis not present

## 2019-05-10 DIAGNOSIS — C773 Secondary and unspecified malignant neoplasm of axilla and upper limb lymph nodes: Secondary | ICD-10-CM | POA: Diagnosis not present

## 2019-05-10 DIAGNOSIS — F329 Major depressive disorder, single episode, unspecified: Secondary | ICD-10-CM | POA: Diagnosis not present

## 2019-05-10 DIAGNOSIS — E119 Type 2 diabetes mellitus without complications: Secondary | ICD-10-CM | POA: Diagnosis not present

## 2019-05-10 DIAGNOSIS — E7800 Pure hypercholesterolemia, unspecified: Secondary | ICD-10-CM | POA: Diagnosis not present

## 2019-05-10 DIAGNOSIS — I10 Essential (primary) hypertension: Secondary | ICD-10-CM | POA: Diagnosis not present

## 2019-05-22 DIAGNOSIS — Z139 Encounter for screening, unspecified: Secondary | ICD-10-CM | POA: Diagnosis not present

## 2019-05-22 DIAGNOSIS — E559 Vitamin D deficiency, unspecified: Secondary | ICD-10-CM | POA: Diagnosis not present

## 2019-05-22 DIAGNOSIS — E1140 Type 2 diabetes mellitus with diabetic neuropathy, unspecified: Secondary | ICD-10-CM | POA: Diagnosis not present

## 2019-05-22 DIAGNOSIS — D649 Anemia, unspecified: Secondary | ICD-10-CM | POA: Diagnosis not present

## 2019-05-22 DIAGNOSIS — E782 Mixed hyperlipidemia: Secondary | ICD-10-CM | POA: Diagnosis not present

## 2019-05-22 DIAGNOSIS — Z79899 Other long term (current) drug therapy: Secondary | ICD-10-CM | POA: Diagnosis not present

## 2019-05-24 DIAGNOSIS — D649 Anemia, unspecified: Secondary | ICD-10-CM | POA: Diagnosis not present

## 2019-05-24 DIAGNOSIS — E782 Mixed hyperlipidemia: Secondary | ICD-10-CM | POA: Diagnosis not present

## 2019-05-24 DIAGNOSIS — E559 Vitamin D deficiency, unspecified: Secondary | ICD-10-CM | POA: Diagnosis not present

## 2019-05-24 DIAGNOSIS — E1140 Type 2 diabetes mellitus with diabetic neuropathy, unspecified: Secondary | ICD-10-CM | POA: Diagnosis not present

## 2019-05-28 DIAGNOSIS — E7800 Pure hypercholesterolemia, unspecified: Secondary | ICD-10-CM | POA: Diagnosis not present

## 2019-05-28 DIAGNOSIS — F329 Major depressive disorder, single episode, unspecified: Secondary | ICD-10-CM | POA: Diagnosis not present

## 2019-05-28 DIAGNOSIS — E119 Type 2 diabetes mellitus without complications: Secondary | ICD-10-CM | POA: Diagnosis not present

## 2019-05-28 DIAGNOSIS — Z9889 Other specified postprocedural states: Secondary | ICD-10-CM | POA: Diagnosis not present

## 2019-05-28 DIAGNOSIS — C50412 Malignant neoplasm of upper-outer quadrant of left female breast: Secondary | ICD-10-CM | POA: Diagnosis not present

## 2019-05-28 DIAGNOSIS — C773 Secondary and unspecified malignant neoplasm of axilla and upper limb lymph nodes: Secondary | ICD-10-CM | POA: Diagnosis not present

## 2019-05-28 DIAGNOSIS — Z87891 Personal history of nicotine dependence: Secondary | ICD-10-CM | POA: Diagnosis not present

## 2019-05-28 DIAGNOSIS — I10 Essential (primary) hypertension: Secondary | ICD-10-CM | POA: Diagnosis not present

## 2019-05-28 DIAGNOSIS — Z170 Estrogen receptor positive status [ER+]: Secondary | ICD-10-CM | POA: Diagnosis not present

## 2019-05-30 DIAGNOSIS — C50412 Malignant neoplasm of upper-outer quadrant of left female breast: Secondary | ICD-10-CM | POA: Diagnosis not present

## 2019-05-30 DIAGNOSIS — I10 Essential (primary) hypertension: Secondary | ICD-10-CM | POA: Diagnosis not present

## 2019-05-30 DIAGNOSIS — Z87891 Personal history of nicotine dependence: Secondary | ICD-10-CM | POA: Diagnosis not present

## 2019-05-30 DIAGNOSIS — E7800 Pure hypercholesterolemia, unspecified: Secondary | ICD-10-CM | POA: Diagnosis not present

## 2019-05-30 DIAGNOSIS — Z170 Estrogen receptor positive status [ER+]: Secondary | ICD-10-CM | POA: Diagnosis not present

## 2019-05-30 DIAGNOSIS — Z9889 Other specified postprocedural states: Secondary | ICD-10-CM | POA: Diagnosis not present

## 2019-05-30 DIAGNOSIS — F329 Major depressive disorder, single episode, unspecified: Secondary | ICD-10-CM | POA: Diagnosis not present

## 2019-05-30 DIAGNOSIS — E119 Type 2 diabetes mellitus without complications: Secondary | ICD-10-CM | POA: Diagnosis not present

## 2019-05-30 DIAGNOSIS — C773 Secondary and unspecified malignant neoplasm of axilla and upper limb lymph nodes: Secondary | ICD-10-CM | POA: Diagnosis not present

## 2019-06-04 DIAGNOSIS — G4733 Obstructive sleep apnea (adult) (pediatric): Secondary | ICD-10-CM | POA: Diagnosis not present

## 2019-06-04 DIAGNOSIS — E7800 Pure hypercholesterolemia, unspecified: Secondary | ICD-10-CM | POA: Diagnosis not present

## 2019-06-04 DIAGNOSIS — I10 Essential (primary) hypertension: Secondary | ICD-10-CM | POA: Diagnosis not present

## 2019-06-04 DIAGNOSIS — E119 Type 2 diabetes mellitus without complications: Secondary | ICD-10-CM | POA: Diagnosis not present

## 2019-06-04 DIAGNOSIS — Z9889 Other specified postprocedural states: Secondary | ICD-10-CM | POA: Diagnosis not present

## 2019-06-04 DIAGNOSIS — C50412 Malignant neoplasm of upper-outer quadrant of left female breast: Secondary | ICD-10-CM | POA: Diagnosis not present

## 2019-06-04 DIAGNOSIS — C773 Secondary and unspecified malignant neoplasm of axilla and upper limb lymph nodes: Secondary | ICD-10-CM | POA: Diagnosis not present

## 2019-06-04 DIAGNOSIS — Z170 Estrogen receptor positive status [ER+]: Secondary | ICD-10-CM | POA: Diagnosis not present

## 2019-06-04 DIAGNOSIS — Z87891 Personal history of nicotine dependence: Secondary | ICD-10-CM | POA: Diagnosis not present

## 2019-06-04 DIAGNOSIS — F329 Major depressive disorder, single episode, unspecified: Secondary | ICD-10-CM | POA: Diagnosis not present

## 2019-06-05 DIAGNOSIS — E119 Type 2 diabetes mellitus without complications: Secondary | ICD-10-CM | POA: Diagnosis not present

## 2019-06-05 DIAGNOSIS — F329 Major depressive disorder, single episode, unspecified: Secondary | ICD-10-CM | POA: Diagnosis not present

## 2019-06-05 DIAGNOSIS — C50412 Malignant neoplasm of upper-outer quadrant of left female breast: Secondary | ICD-10-CM | POA: Diagnosis not present

## 2019-06-05 DIAGNOSIS — Z87891 Personal history of nicotine dependence: Secondary | ICD-10-CM | POA: Diagnosis not present

## 2019-06-05 DIAGNOSIS — Z9889 Other specified postprocedural states: Secondary | ICD-10-CM | POA: Diagnosis not present

## 2019-06-05 DIAGNOSIS — Z170 Estrogen receptor positive status [ER+]: Secondary | ICD-10-CM | POA: Diagnosis not present

## 2019-06-05 DIAGNOSIS — I10 Essential (primary) hypertension: Secondary | ICD-10-CM | POA: Diagnosis not present

## 2019-06-05 DIAGNOSIS — E7800 Pure hypercholesterolemia, unspecified: Secondary | ICD-10-CM | POA: Diagnosis not present

## 2019-06-05 DIAGNOSIS — C773 Secondary and unspecified malignant neoplasm of axilla and upper limb lymph nodes: Secondary | ICD-10-CM | POA: Diagnosis not present

## 2019-06-06 DIAGNOSIS — Z170 Estrogen receptor positive status [ER+]: Secondary | ICD-10-CM | POA: Diagnosis not present

## 2019-06-06 DIAGNOSIS — I10 Essential (primary) hypertension: Secondary | ICD-10-CM | POA: Diagnosis not present

## 2019-06-06 DIAGNOSIS — E119 Type 2 diabetes mellitus without complications: Secondary | ICD-10-CM | POA: Diagnosis not present

## 2019-06-06 DIAGNOSIS — F329 Major depressive disorder, single episode, unspecified: Secondary | ICD-10-CM | POA: Diagnosis not present

## 2019-06-06 DIAGNOSIS — C50412 Malignant neoplasm of upper-outer quadrant of left female breast: Secondary | ICD-10-CM | POA: Diagnosis not present

## 2019-06-06 DIAGNOSIS — C773 Secondary and unspecified malignant neoplasm of axilla and upper limb lymph nodes: Secondary | ICD-10-CM | POA: Diagnosis not present

## 2019-06-06 DIAGNOSIS — Z87891 Personal history of nicotine dependence: Secondary | ICD-10-CM | POA: Diagnosis not present

## 2019-06-06 DIAGNOSIS — Z9889 Other specified postprocedural states: Secondary | ICD-10-CM | POA: Diagnosis not present

## 2019-06-06 DIAGNOSIS — E7800 Pure hypercholesterolemia, unspecified: Secondary | ICD-10-CM | POA: Diagnosis not present

## 2019-06-07 DIAGNOSIS — Z87891 Personal history of nicotine dependence: Secondary | ICD-10-CM | POA: Diagnosis not present

## 2019-06-07 DIAGNOSIS — E119 Type 2 diabetes mellitus without complications: Secondary | ICD-10-CM | POA: Diagnosis not present

## 2019-06-07 DIAGNOSIS — E7800 Pure hypercholesterolemia, unspecified: Secondary | ICD-10-CM | POA: Diagnosis not present

## 2019-06-07 DIAGNOSIS — F329 Major depressive disorder, single episode, unspecified: Secondary | ICD-10-CM | POA: Diagnosis not present

## 2019-06-07 DIAGNOSIS — I10 Essential (primary) hypertension: Secondary | ICD-10-CM | POA: Diagnosis not present

## 2019-06-07 DIAGNOSIS — C773 Secondary and unspecified malignant neoplasm of axilla and upper limb lymph nodes: Secondary | ICD-10-CM | POA: Diagnosis not present

## 2019-06-07 DIAGNOSIS — Z9889 Other specified postprocedural states: Secondary | ICD-10-CM | POA: Diagnosis not present

## 2019-06-07 DIAGNOSIS — Z170 Estrogen receptor positive status [ER+]: Secondary | ICD-10-CM | POA: Diagnosis not present

## 2019-06-07 DIAGNOSIS — C50412 Malignant neoplasm of upper-outer quadrant of left female breast: Secondary | ICD-10-CM | POA: Diagnosis not present

## 2019-06-11 DIAGNOSIS — E119 Type 2 diabetes mellitus without complications: Secondary | ICD-10-CM | POA: Diagnosis not present

## 2019-06-11 DIAGNOSIS — Z170 Estrogen receptor positive status [ER+]: Secondary | ICD-10-CM | POA: Diagnosis not present

## 2019-06-11 DIAGNOSIS — I10 Essential (primary) hypertension: Secondary | ICD-10-CM | POA: Diagnosis not present

## 2019-06-11 DIAGNOSIS — Z87891 Personal history of nicotine dependence: Secondary | ICD-10-CM | POA: Diagnosis not present

## 2019-06-11 DIAGNOSIS — E7800 Pure hypercholesterolemia, unspecified: Secondary | ICD-10-CM | POA: Diagnosis not present

## 2019-06-11 DIAGNOSIS — C50412 Malignant neoplasm of upper-outer quadrant of left female breast: Secondary | ICD-10-CM | POA: Diagnosis not present

## 2019-06-11 DIAGNOSIS — Z9889 Other specified postprocedural states: Secondary | ICD-10-CM | POA: Diagnosis not present

## 2019-06-11 DIAGNOSIS — C773 Secondary and unspecified malignant neoplasm of axilla and upper limb lymph nodes: Secondary | ICD-10-CM | POA: Diagnosis not present

## 2019-06-11 DIAGNOSIS — F329 Major depressive disorder, single episode, unspecified: Secondary | ICD-10-CM | POA: Diagnosis not present

## 2019-06-12 DIAGNOSIS — C773 Secondary and unspecified malignant neoplasm of axilla and upper limb lymph nodes: Secondary | ICD-10-CM | POA: Diagnosis not present

## 2019-06-12 DIAGNOSIS — E7800 Pure hypercholesterolemia, unspecified: Secondary | ICD-10-CM | POA: Diagnosis not present

## 2019-06-12 DIAGNOSIS — Z170 Estrogen receptor positive status [ER+]: Secondary | ICD-10-CM | POA: Diagnosis not present

## 2019-06-12 DIAGNOSIS — I10 Essential (primary) hypertension: Secondary | ICD-10-CM | POA: Diagnosis not present

## 2019-06-12 DIAGNOSIS — E119 Type 2 diabetes mellitus without complications: Secondary | ICD-10-CM | POA: Diagnosis not present

## 2019-06-12 DIAGNOSIS — Z9889 Other specified postprocedural states: Secondary | ICD-10-CM | POA: Diagnosis not present

## 2019-06-12 DIAGNOSIS — F329 Major depressive disorder, single episode, unspecified: Secondary | ICD-10-CM | POA: Diagnosis not present

## 2019-06-12 DIAGNOSIS — Z87891 Personal history of nicotine dependence: Secondary | ICD-10-CM | POA: Diagnosis not present

## 2019-06-12 DIAGNOSIS — C50412 Malignant neoplasm of upper-outer quadrant of left female breast: Secondary | ICD-10-CM | POA: Diagnosis not present

## 2019-06-13 DIAGNOSIS — F329 Major depressive disorder, single episode, unspecified: Secondary | ICD-10-CM | POA: Diagnosis not present

## 2019-06-13 DIAGNOSIS — C773 Secondary and unspecified malignant neoplasm of axilla and upper limb lymph nodes: Secondary | ICD-10-CM | POA: Diagnosis not present

## 2019-06-13 DIAGNOSIS — Z87891 Personal history of nicotine dependence: Secondary | ICD-10-CM | POA: Diagnosis not present

## 2019-06-13 DIAGNOSIS — E7800 Pure hypercholesterolemia, unspecified: Secondary | ICD-10-CM | POA: Diagnosis not present

## 2019-06-13 DIAGNOSIS — Z170 Estrogen receptor positive status [ER+]: Secondary | ICD-10-CM | POA: Diagnosis not present

## 2019-06-13 DIAGNOSIS — E119 Type 2 diabetes mellitus without complications: Secondary | ICD-10-CM | POA: Diagnosis not present

## 2019-06-13 DIAGNOSIS — I10 Essential (primary) hypertension: Secondary | ICD-10-CM | POA: Diagnosis not present

## 2019-06-13 DIAGNOSIS — C50412 Malignant neoplasm of upper-outer quadrant of left female breast: Secondary | ICD-10-CM | POA: Diagnosis not present

## 2019-06-13 DIAGNOSIS — Z9889 Other specified postprocedural states: Secondary | ICD-10-CM | POA: Diagnosis not present

## 2019-06-14 DIAGNOSIS — E119 Type 2 diabetes mellitus without complications: Secondary | ICD-10-CM | POA: Diagnosis not present

## 2019-06-14 DIAGNOSIS — C50412 Malignant neoplasm of upper-outer quadrant of left female breast: Secondary | ICD-10-CM | POA: Diagnosis not present

## 2019-06-14 DIAGNOSIS — C773 Secondary and unspecified malignant neoplasm of axilla and upper limb lymph nodes: Secondary | ICD-10-CM | POA: Diagnosis not present

## 2019-06-14 DIAGNOSIS — E7800 Pure hypercholesterolemia, unspecified: Secondary | ICD-10-CM | POA: Diagnosis not present

## 2019-06-14 DIAGNOSIS — Z9889 Other specified postprocedural states: Secondary | ICD-10-CM | POA: Diagnosis not present

## 2019-06-14 DIAGNOSIS — Z87891 Personal history of nicotine dependence: Secondary | ICD-10-CM | POA: Diagnosis not present

## 2019-06-14 DIAGNOSIS — Z170 Estrogen receptor positive status [ER+]: Secondary | ICD-10-CM | POA: Diagnosis not present

## 2019-06-14 DIAGNOSIS — F329 Major depressive disorder, single episode, unspecified: Secondary | ICD-10-CM | POA: Diagnosis not present

## 2019-06-14 DIAGNOSIS — I10 Essential (primary) hypertension: Secondary | ICD-10-CM | POA: Diagnosis not present

## 2019-06-15 DIAGNOSIS — F329 Major depressive disorder, single episode, unspecified: Secondary | ICD-10-CM | POA: Diagnosis not present

## 2019-06-15 DIAGNOSIS — Z170 Estrogen receptor positive status [ER+]: Secondary | ICD-10-CM | POA: Diagnosis not present

## 2019-06-15 DIAGNOSIS — I10 Essential (primary) hypertension: Secondary | ICD-10-CM | POA: Diagnosis not present

## 2019-06-15 DIAGNOSIS — C50412 Malignant neoplasm of upper-outer quadrant of left female breast: Secondary | ICD-10-CM | POA: Diagnosis not present

## 2019-06-15 DIAGNOSIS — Z87891 Personal history of nicotine dependence: Secondary | ICD-10-CM | POA: Diagnosis not present

## 2019-06-15 DIAGNOSIS — E119 Type 2 diabetes mellitus without complications: Secondary | ICD-10-CM | POA: Diagnosis not present

## 2019-06-15 DIAGNOSIS — Z9889 Other specified postprocedural states: Secondary | ICD-10-CM | POA: Diagnosis not present

## 2019-06-15 DIAGNOSIS — E7800 Pure hypercholesterolemia, unspecified: Secondary | ICD-10-CM | POA: Diagnosis not present

## 2019-06-15 DIAGNOSIS — C773 Secondary and unspecified malignant neoplasm of axilla and upper limb lymph nodes: Secondary | ICD-10-CM | POA: Diagnosis not present

## 2019-06-18 DIAGNOSIS — F329 Major depressive disorder, single episode, unspecified: Secondary | ICD-10-CM | POA: Diagnosis not present

## 2019-06-18 DIAGNOSIS — E7800 Pure hypercholesterolemia, unspecified: Secondary | ICD-10-CM | POA: Diagnosis not present

## 2019-06-18 DIAGNOSIS — Z9889 Other specified postprocedural states: Secondary | ICD-10-CM | POA: Diagnosis not present

## 2019-06-18 DIAGNOSIS — C773 Secondary and unspecified malignant neoplasm of axilla and upper limb lymph nodes: Secondary | ICD-10-CM | POA: Diagnosis not present

## 2019-06-18 DIAGNOSIS — C50412 Malignant neoplasm of upper-outer quadrant of left female breast: Secondary | ICD-10-CM | POA: Diagnosis not present

## 2019-06-18 DIAGNOSIS — E119 Type 2 diabetes mellitus without complications: Secondary | ICD-10-CM | POA: Diagnosis not present

## 2019-06-18 DIAGNOSIS — Z170 Estrogen receptor positive status [ER+]: Secondary | ICD-10-CM | POA: Diagnosis not present

## 2019-06-18 DIAGNOSIS — I10 Essential (primary) hypertension: Secondary | ICD-10-CM | POA: Diagnosis not present

## 2019-06-18 DIAGNOSIS — Z87891 Personal history of nicotine dependence: Secondary | ICD-10-CM | POA: Diagnosis not present

## 2019-06-19 DIAGNOSIS — Z170 Estrogen receptor positive status [ER+]: Secondary | ICD-10-CM | POA: Diagnosis not present

## 2019-06-19 DIAGNOSIS — Z87891 Personal history of nicotine dependence: Secondary | ICD-10-CM | POA: Diagnosis not present

## 2019-06-19 DIAGNOSIS — Z9889 Other specified postprocedural states: Secondary | ICD-10-CM | POA: Diagnosis not present

## 2019-06-19 DIAGNOSIS — C773 Secondary and unspecified malignant neoplasm of axilla and upper limb lymph nodes: Secondary | ICD-10-CM | POA: Diagnosis not present

## 2019-06-19 DIAGNOSIS — E7800 Pure hypercholesterolemia, unspecified: Secondary | ICD-10-CM | POA: Diagnosis not present

## 2019-06-19 DIAGNOSIS — F329 Major depressive disorder, single episode, unspecified: Secondary | ICD-10-CM | POA: Diagnosis not present

## 2019-06-19 DIAGNOSIS — I10 Essential (primary) hypertension: Secondary | ICD-10-CM | POA: Diagnosis not present

## 2019-06-19 DIAGNOSIS — E119 Type 2 diabetes mellitus without complications: Secondary | ICD-10-CM | POA: Diagnosis not present

## 2019-06-19 DIAGNOSIS — C50412 Malignant neoplasm of upper-outer quadrant of left female breast: Secondary | ICD-10-CM | POA: Diagnosis not present

## 2019-06-20 DIAGNOSIS — C50412 Malignant neoplasm of upper-outer quadrant of left female breast: Secondary | ICD-10-CM | POA: Diagnosis not present

## 2019-06-20 DIAGNOSIS — I10 Essential (primary) hypertension: Secondary | ICD-10-CM | POA: Diagnosis not present

## 2019-06-20 DIAGNOSIS — C773 Secondary and unspecified malignant neoplasm of axilla and upper limb lymph nodes: Secondary | ICD-10-CM | POA: Diagnosis not present

## 2019-06-20 DIAGNOSIS — E119 Type 2 diabetes mellitus without complications: Secondary | ICD-10-CM | POA: Diagnosis not present

## 2019-06-20 DIAGNOSIS — Z170 Estrogen receptor positive status [ER+]: Secondary | ICD-10-CM | POA: Diagnosis not present

## 2019-06-20 DIAGNOSIS — E7800 Pure hypercholesterolemia, unspecified: Secondary | ICD-10-CM | POA: Diagnosis not present

## 2019-06-20 DIAGNOSIS — F329 Major depressive disorder, single episode, unspecified: Secondary | ICD-10-CM | POA: Diagnosis not present

## 2019-06-20 DIAGNOSIS — Z9889 Other specified postprocedural states: Secondary | ICD-10-CM | POA: Diagnosis not present

## 2019-06-20 DIAGNOSIS — Z87891 Personal history of nicotine dependence: Secondary | ICD-10-CM | POA: Diagnosis not present

## 2019-06-21 ENCOUNTER — Inpatient Hospital Stay (HOSPITAL_COMMUNITY): Payer: BC Managed Care – PPO | Attending: Genetic Counselor

## 2019-06-21 ENCOUNTER — Inpatient Hospital Stay (HOSPITAL_BASED_OUTPATIENT_CLINIC_OR_DEPARTMENT_OTHER): Payer: BC Managed Care – PPO | Admitting: Genetic Counselor

## 2019-06-21 ENCOUNTER — Other Ambulatory Visit: Payer: Self-pay

## 2019-06-21 ENCOUNTER — Encounter (HOSPITAL_COMMUNITY): Payer: Self-pay | Admitting: Genetic Counselor

## 2019-06-21 DIAGNOSIS — Z803 Family history of malignant neoplasm of breast: Secondary | ICD-10-CM

## 2019-06-21 DIAGNOSIS — C50412 Malignant neoplasm of upper-outer quadrant of left female breast: Secondary | ICD-10-CM

## 2019-06-21 DIAGNOSIS — Z9889 Other specified postprocedural states: Secondary | ICD-10-CM | POA: Diagnosis not present

## 2019-06-21 DIAGNOSIS — C773 Secondary and unspecified malignant neoplasm of axilla and upper limb lymph nodes: Secondary | ICD-10-CM | POA: Diagnosis not present

## 2019-06-21 DIAGNOSIS — E7800 Pure hypercholesterolemia, unspecified: Secondary | ICD-10-CM | POA: Diagnosis not present

## 2019-06-21 DIAGNOSIS — F329 Major depressive disorder, single episode, unspecified: Secondary | ICD-10-CM | POA: Diagnosis not present

## 2019-06-21 DIAGNOSIS — Z170 Estrogen receptor positive status [ER+]: Secondary | ICD-10-CM

## 2019-06-21 DIAGNOSIS — Z87891 Personal history of nicotine dependence: Secondary | ICD-10-CM | POA: Diagnosis not present

## 2019-06-21 DIAGNOSIS — E119 Type 2 diabetes mellitus without complications: Secondary | ICD-10-CM | POA: Diagnosis not present

## 2019-06-21 DIAGNOSIS — I10 Essential (primary) hypertension: Secondary | ICD-10-CM | POA: Diagnosis not present

## 2019-06-21 NOTE — Progress Notes (Signed)
REFERRING PROVIDER: Derek Jack, MD [address] [place],  [place] [address]  PRIMARY PROVIDER:  Rory Percy, MD  PRIMARY REASON FOR VISIT:  1. Malignant neoplasm of upper-outer quadrant of left breast in female, estrogen receptor positive ([place])   2. Family history of breast cancer      HISTORY OF PRESENT ILLNESS:  I connected with  Erica Giles on 06/21/2019 at 10:00 AM EDT by MyChart video conference and verified that I am speaking with the correct person using two identifiers.   Patient location: [facility] Provider location: [facility]  Erica Giles, a 64 y.o. female, was seen for a [facility] cancer genetics consultation at the request of Dr. Delton Coombes due to a personal and family history of breast cancer.  Erica Giles presents to clinic today to discuss the possibility of a hereditary predisposition to cancer, genetic testing, and to further clarify her future cancer risks, as well as potential cancer risks for family members.   In September 2020, at the age of 47, Erica Giles was diagnosed with cancer of the left breast. The treatment plan lumpectomy and radiation.   CANCER HISTORY:  Oncology History  Breast cancer of upper-outer quadrant of left female breast ([place])  03/06/2019 Initial Diagnosis   Breast cancer of upper-outer quadrant of left female breast ([place])   04/24/2019 Cancer Staging   Staging form: Breast, AJCC 8th Edition - Clinical: Stage IB (cT1c, cN1, cM0, G2, ER+, PR+, HER2-) - Signed by Derek Jack, MD on 04/24/2019      RISK FACTORS:  Menarche was at age 89.  First live birth at age 33.  Ovaries intact: yes.  Hysterectomy: yes.  Menopausal status: postmenopausal.  HRT use: 0 years. Colonoscopy: yes; normal. Mammogram within the last year: yes. Number of breast biopsies: 1. Up to date with pelvic exams: yes. Any excessive radiation exposure in the past: no  Past Medical History:  Diagnosis Date  . Anxiety   . Arthritis   . Depression   .  Diabetes mellitus    ORAL MED  . Family history of breast cancer   . GERD (gastroesophageal reflux disease)   . Helicobacter pylori gastritis   . Hiatal hernia   . HNP (herniated nucleus pulposus), lumbar    PT TRYING TO AVOID LUMBAR SURGERY--STATES PAIN IN HER BACK AND SOMETIMES DOWN ONE OR THE OTHER LEG  . Hypertension   . Plantar fasciitis    BILATERAL  . Schatzki's ring   . Shortness of breath    WITH EXERTION-PT RELATES TO HER WEIGHT  . Sleep apnea    MODERATE- PER SLEEP STUDY 06/2010--PT USES CPAP-SETTING IS 11    Past Surgical History:  Procedure Laterality Date  . ABDOMINAL HYSTERECTOMY    . BREAST LUMPECTOMY WITH RADIOACTIVE SEED AND SENTINEL LYMPH NODE BIOPSY Left 03/27/2019   Procedure: LEFT BREAST LUMPECTOMY WITH RADIOACTIVE SEED AND LEFT AXILLARY DEEP SENTINEL LYMPH NODE BIOPSY INJECT BLUE DYE;  Surgeon: Fanny Skates, MD;  Location: [facility];  Service: General;  Laterality: Left;  . CARPAL TUNNEL RELEASE     BILATERAL  . CHOLECYSTECTOMY    . CYST REMOVED     LEFT THUMB AND RT MIDDLE FINGER  . ESOPHAGECTOMY    . TONSILLECTOMY    . TRIGGER FINGER REPAIR      Social History   Socioeconomic History  . Marital status: Married    Spouse name: Not on file  . Number of children: 2  . Years of education: Not on file  .  Highest education level: Not on file  Occupational History  . Occupation: Scientist, product/process development: [employer]  Tobacco Use  . Smoking status: Former Smoker    Packs/day: 1.00    Years: 25.00    Pack years: 25.00    Quit date: 03/05/2008    Years since quitting: 11.3  . Smokeless tobacco: Never Used  . Tobacco comment: 03/2008  Substance and Sexual Activity  . Alcohol use: No  . Drug use: No  . Sexual activity: Not on file    Comment: hysterectomy  Other Topics Concern  . Not on file  Social History Narrative  . Not on file   Social Determinants of Health   Financial Resource Strain: Medium Risk  . Difficulty of Paying Living  Expenses: Somewhat hard  Food Insecurity: No Food Insecurity  . Worried About Charity fundraiser in the Last Year: Never true  . Ran Out of Food in the Last Year: Never true  Transportation Needs: No Transportation Needs  . Lack of Transportation (Medical): No  . Lack of Transportation (Non-Medical): No  Physical Activity: Inactive  . Days of Exercise per Week: 0 days  . Minutes of Exercise per Session: 0 min  Stress: Stress Concern Present  . Feeling of Stress : To some extent  Social Connections: Not Isolated  . Frequency of Communication with Friends and Family: Three times a week  . Frequency of Social Gatherings with Friends and Family: Once a week  . Attends Religious Services: More than 4 times per year  . Active Member of Clubs or Organizations: Yes  . Attends Archivist Meetings: More than 4 times per year  . Marital Status: Married     FAMILY HISTORY:  We obtained a detailed, 4-generation family history.  Significant diagnoses are listed below: Family History  Problem Relation Age of Onset  . Diabetes Mother   . Kidney disease Mother   . Dementia Mother   . Diabetes Father   . Kidney Stones Sister   . Diabetes Brother   . Kidney Stones Brother   . Scoliosis Son   . Other Daughter        infanct death  . Kidney cancer Maternal Aunt 80  . Breast cancer Paternal Aunt 15  . Other Sister 37       murdered  . Other Sister 7       murdered  . Other Brother        infant death  . Breast cancer Maternal Aunt        dx over 46  . Breast cancer Paternal Aunt 49  . Breast cancer Paternal Aunt        dx over 48    The patient has a son and daughter who are twins and are cancer free.  She had a daughter who died soon after birth.  She has three brother and four sisters.  Two sisters were murdered in childhood, and one brother died soon after birth.  Both parents are deceased.  The patient's mother had five sisters and one brother who were full siblings.  She  had multiple half siblings that she is less familiar with.  One sister had breast cancer and one sister had kidney cancer.  There is no other reported family history on the maternal side of cancer.  The patient's father had four brothers and four sisters.  Three of the four sisters had breast cancer, two were 75 or below.  There is no other reported family history of cancer.  Erica Giles is unaware of previous family history of genetic testing for hereditary cancer risks. Patient's maternal ancestors are of Caucasian descent, and paternal ancestors are of Caucasian descent. There is no reported Ashkenazi Jewish ancestry. There is no known consanguinity.    GENETIC COUNSELING ASSESSMENT: Erica Giles is a 64 y.o. female with a personal and family history of breast cancer which is somewhat suggestive of a hereditary cancer syndrome and predisposition to cancer given the number of women in the family with breast cancer, some at younger ages. We, therefore, discussed and recommended the following at today's visit.   DISCUSSION: We discussed that 5 - 10% of breast cancer is hereditary, with most cases associated with BRCA mutations.  There are other genes that can be associated with hereditary breast cancer syndromes. We discussed that testing is beneficial for several reasons including knowing how to follow individuals after completing their treatment, identifying whether potential treatment options such as PARP inhibitors would be beneficial, and understand if other family members could be at risk for cancer and allow them to undergo genetic testing.   We reviewed the characteristics, features and inheritance patterns of hereditary cancer syndromes. We also discussed genetic testing, including the appropriate family members to test, the process of testing, insurance coverage and turn-around-time for results. We discussed the implications of a negative, positive, carrier and/or variant of uncertain significant  result. We recommended Erica Giles pursue genetic testing for the common hereditary cancer gene panel. The Common Hereditary Gene Panel offered by [facility] includes sequencing and/or deletion duplication testing of the following 48 genes: APC, ATM, AXIN2, BARD1, BMPR1A, BRCA1, BRCA2, BRIP1, CDH1, CDK4, CDKN2A (p14ARF), CDKN2A (p16INK4a), CHEK2, CTNNA1, DICER1, EPCAM (Deletion/duplication testing only), GREM1 (promoter region deletion/duplication testing only), KIT, MEN1, MLH1, MSH2, MSH3, MSH6, MUTYH, NBN, NF1, NHTL1, PALB2, PDGFRA, PMS2, POLD1, POLE, PTEN, RAD50, RAD51C, RAD51D, RNF43, SDHB, SDHC, SDHD, SMAD4, SMARCA4. STK11, TP53, TSC1, TSC2, and VHL.  The following genes were evaluated for sequence changes only: SDHA and HOXB13 c.251G>A variant only.   Based on Erica Giles's personal and family history of cancer, she meets medical criteria for genetic testing. Despite that she meets criteria, she may still have an out of pocket cost. We discussed that if her out of pocket cost for testing is over $100, the laboratory will call and confirm whether she wants to proceed with testing.  If the out of pocket cost of testing is less than $100 she will be billed by the genetic testing laboratory.   PLAN: After considering the risks, benefits, and limitations, Erica Giles provided informed consent to pursue genetic testing and the blood sample was sent to [facility] for analysis of the common hereditary cancer panel. Results should be available within approximately 2-3 weeks' time, at which point they will be disclosed by telephone to Erica Giles, as will any additional recommendations warranted by these results. Erica Giles will receive a summary of her genetic counseling visit and a copy of her results once available. This information will also be available in Epic.   Lastly, we encouraged Erica Giles to remain in contact with cancer genetics annually so that we can continuously update the family history and inform  her of any changes in cancer genetics and testing that may be of benefit for this family.   Erica Giles questions were answered to her satisfaction today. Our contact information was provided should additional questions or concerns arise. Thank you for the  referral and allowing [place] to share in the care of your patient.   Erica Giles, [place], [facility] Licensed, Insurance risk surveyor [email or web address] phone: [phone]  The patient was seen for a total of 45 minutes in face-to-face genetic counseling.  This patient was discussed with Drs. Magrinat, Lindi Adie and/or Burr Medico who agrees with the above.    _______________________________________________________________________ For Office Staff:  Number of people involved in session: 1 Was an Intern/ student involved with case: no

## 2019-06-22 DIAGNOSIS — C773 Secondary and unspecified malignant neoplasm of axilla and upper limb lymph nodes: Secondary | ICD-10-CM | POA: Diagnosis not present

## 2019-06-22 DIAGNOSIS — C50412 Malignant neoplasm of upper-outer quadrant of left female breast: Secondary | ICD-10-CM | POA: Diagnosis not present

## 2019-06-22 DIAGNOSIS — Z9889 Other specified postprocedural states: Secondary | ICD-10-CM | POA: Diagnosis not present

## 2019-06-22 DIAGNOSIS — Z170 Estrogen receptor positive status [ER+]: Secondary | ICD-10-CM | POA: Diagnosis not present

## 2019-06-22 DIAGNOSIS — E7800 Pure hypercholesterolemia, unspecified: Secondary | ICD-10-CM | POA: Diagnosis not present

## 2019-06-22 DIAGNOSIS — I10 Essential (primary) hypertension: Secondary | ICD-10-CM | POA: Diagnosis not present

## 2019-06-22 DIAGNOSIS — E119 Type 2 diabetes mellitus without complications: Secondary | ICD-10-CM | POA: Diagnosis not present

## 2019-06-22 DIAGNOSIS — Z87891 Personal history of nicotine dependence: Secondary | ICD-10-CM | POA: Diagnosis not present

## 2019-06-22 DIAGNOSIS — F329 Major depressive disorder, single episode, unspecified: Secondary | ICD-10-CM | POA: Diagnosis not present

## 2019-06-26 DIAGNOSIS — C773 Secondary and unspecified malignant neoplasm of axilla and upper limb lymph nodes: Secondary | ICD-10-CM | POA: Diagnosis not present

## 2019-06-26 DIAGNOSIS — E7800 Pure hypercholesterolemia, unspecified: Secondary | ICD-10-CM | POA: Diagnosis not present

## 2019-06-26 DIAGNOSIS — Z87891 Personal history of nicotine dependence: Secondary | ICD-10-CM | POA: Diagnosis not present

## 2019-06-26 DIAGNOSIS — F329 Major depressive disorder, single episode, unspecified: Secondary | ICD-10-CM | POA: Diagnosis not present

## 2019-06-26 DIAGNOSIS — Z9889 Other specified postprocedural states: Secondary | ICD-10-CM | POA: Diagnosis not present

## 2019-06-26 DIAGNOSIS — Z170 Estrogen receptor positive status [ER+]: Secondary | ICD-10-CM | POA: Diagnosis not present

## 2019-06-26 DIAGNOSIS — E119 Type 2 diabetes mellitus without complications: Secondary | ICD-10-CM | POA: Diagnosis not present

## 2019-06-26 DIAGNOSIS — I10 Essential (primary) hypertension: Secondary | ICD-10-CM | POA: Diagnosis not present

## 2019-06-26 DIAGNOSIS — C50412 Malignant neoplasm of upper-outer quadrant of left female breast: Secondary | ICD-10-CM | POA: Diagnosis not present

## 2019-06-27 DIAGNOSIS — Z9889 Other specified postprocedural states: Secondary | ICD-10-CM | POA: Diagnosis not present

## 2019-06-27 DIAGNOSIS — E7800 Pure hypercholesterolemia, unspecified: Secondary | ICD-10-CM | POA: Diagnosis not present

## 2019-06-27 DIAGNOSIS — Z87891 Personal history of nicotine dependence: Secondary | ICD-10-CM | POA: Diagnosis not present

## 2019-06-27 DIAGNOSIS — Z170 Estrogen receptor positive status [ER+]: Secondary | ICD-10-CM | POA: Diagnosis not present

## 2019-06-27 DIAGNOSIS — C773 Secondary and unspecified malignant neoplasm of axilla and upper limb lymph nodes: Secondary | ICD-10-CM | POA: Diagnosis not present

## 2019-06-27 DIAGNOSIS — C50412 Malignant neoplasm of upper-outer quadrant of left female breast: Secondary | ICD-10-CM | POA: Diagnosis not present

## 2019-06-27 DIAGNOSIS — I10 Essential (primary) hypertension: Secondary | ICD-10-CM | POA: Diagnosis not present

## 2019-06-27 DIAGNOSIS — E119 Type 2 diabetes mellitus without complications: Secondary | ICD-10-CM | POA: Diagnosis not present

## 2019-06-27 DIAGNOSIS — F329 Major depressive disorder, single episode, unspecified: Secondary | ICD-10-CM | POA: Diagnosis not present

## 2019-06-28 DIAGNOSIS — Z9889 Other specified postprocedural states: Secondary | ICD-10-CM | POA: Diagnosis not present

## 2019-06-28 DIAGNOSIS — Z87891 Personal history of nicotine dependence: Secondary | ICD-10-CM | POA: Diagnosis not present

## 2019-06-28 DIAGNOSIS — E7800 Pure hypercholesterolemia, unspecified: Secondary | ICD-10-CM | POA: Diagnosis not present

## 2019-06-28 DIAGNOSIS — C773 Secondary and unspecified malignant neoplasm of axilla and upper limb lymph nodes: Secondary | ICD-10-CM | POA: Diagnosis not present

## 2019-06-28 DIAGNOSIS — E119 Type 2 diabetes mellitus without complications: Secondary | ICD-10-CM | POA: Diagnosis not present

## 2019-06-28 DIAGNOSIS — Z170 Estrogen receptor positive status [ER+]: Secondary | ICD-10-CM | POA: Diagnosis not present

## 2019-06-28 DIAGNOSIS — C50412 Malignant neoplasm of upper-outer quadrant of left female breast: Secondary | ICD-10-CM | POA: Diagnosis not present

## 2019-06-28 DIAGNOSIS — I10 Essential (primary) hypertension: Secondary | ICD-10-CM | POA: Diagnosis not present

## 2019-06-28 DIAGNOSIS — F329 Major depressive disorder, single episode, unspecified: Secondary | ICD-10-CM | POA: Diagnosis not present

## 2019-06-29 DIAGNOSIS — C50412 Malignant neoplasm of upper-outer quadrant of left female breast: Secondary | ICD-10-CM | POA: Diagnosis not present

## 2019-06-29 DIAGNOSIS — I10 Essential (primary) hypertension: Secondary | ICD-10-CM | POA: Diagnosis not present

## 2019-06-29 DIAGNOSIS — F329 Major depressive disorder, single episode, unspecified: Secondary | ICD-10-CM | POA: Diagnosis not present

## 2019-06-29 DIAGNOSIS — E7800 Pure hypercholesterolemia, unspecified: Secondary | ICD-10-CM | POA: Diagnosis not present

## 2019-06-29 DIAGNOSIS — Z9889 Other specified postprocedural states: Secondary | ICD-10-CM | POA: Diagnosis not present

## 2019-06-29 DIAGNOSIS — C773 Secondary and unspecified malignant neoplasm of axilla and upper limb lymph nodes: Secondary | ICD-10-CM | POA: Diagnosis not present

## 2019-06-29 DIAGNOSIS — E119 Type 2 diabetes mellitus without complications: Secondary | ICD-10-CM | POA: Diagnosis not present

## 2019-06-29 DIAGNOSIS — Z87891 Personal history of nicotine dependence: Secondary | ICD-10-CM | POA: Diagnosis not present

## 2019-06-29 DIAGNOSIS — Z170 Estrogen receptor positive status [ER+]: Secondary | ICD-10-CM | POA: Diagnosis not present

## 2019-07-02 DIAGNOSIS — E119 Type 2 diabetes mellitus without complications: Secondary | ICD-10-CM | POA: Diagnosis not present

## 2019-07-02 DIAGNOSIS — Z87891 Personal history of nicotine dependence: Secondary | ICD-10-CM | POA: Diagnosis not present

## 2019-07-02 DIAGNOSIS — I10 Essential (primary) hypertension: Secondary | ICD-10-CM | POA: Diagnosis not present

## 2019-07-02 DIAGNOSIS — F329 Major depressive disorder, single episode, unspecified: Secondary | ICD-10-CM | POA: Diagnosis not present

## 2019-07-02 DIAGNOSIS — C50412 Malignant neoplasm of upper-outer quadrant of left female breast: Secondary | ICD-10-CM | POA: Diagnosis not present

## 2019-07-02 DIAGNOSIS — Z170 Estrogen receptor positive status [ER+]: Secondary | ICD-10-CM | POA: Diagnosis not present

## 2019-07-02 DIAGNOSIS — E7800 Pure hypercholesterolemia, unspecified: Secondary | ICD-10-CM | POA: Diagnosis not present

## 2019-07-02 DIAGNOSIS — Z9889 Other specified postprocedural states: Secondary | ICD-10-CM | POA: Diagnosis not present

## 2019-07-02 DIAGNOSIS — C773 Secondary and unspecified malignant neoplasm of axilla and upper limb lymph nodes: Secondary | ICD-10-CM | POA: Diagnosis not present

## 2019-07-03 DIAGNOSIS — E7800 Pure hypercholesterolemia, unspecified: Secondary | ICD-10-CM | POA: Diagnosis not present

## 2019-07-03 DIAGNOSIS — Z9889 Other specified postprocedural states: Secondary | ICD-10-CM | POA: Diagnosis not present

## 2019-07-03 DIAGNOSIS — Z87891 Personal history of nicotine dependence: Secondary | ICD-10-CM | POA: Diagnosis not present

## 2019-07-03 DIAGNOSIS — I10 Essential (primary) hypertension: Secondary | ICD-10-CM | POA: Diagnosis not present

## 2019-07-03 DIAGNOSIS — C773 Secondary and unspecified malignant neoplasm of axilla and upper limb lymph nodes: Secondary | ICD-10-CM | POA: Diagnosis not present

## 2019-07-03 DIAGNOSIS — C50412 Malignant neoplasm of upper-outer quadrant of left female breast: Secondary | ICD-10-CM | POA: Diagnosis not present

## 2019-07-03 DIAGNOSIS — E119 Type 2 diabetes mellitus without complications: Secondary | ICD-10-CM | POA: Diagnosis not present

## 2019-07-03 DIAGNOSIS — Z170 Estrogen receptor positive status [ER+]: Secondary | ICD-10-CM | POA: Diagnosis not present

## 2019-07-03 DIAGNOSIS — F329 Major depressive disorder, single episode, unspecified: Secondary | ICD-10-CM | POA: Diagnosis not present

## 2019-07-04 DIAGNOSIS — Z87891 Personal history of nicotine dependence: Secondary | ICD-10-CM | POA: Diagnosis not present

## 2019-07-04 DIAGNOSIS — C50412 Malignant neoplasm of upper-outer quadrant of left female breast: Secondary | ICD-10-CM | POA: Diagnosis not present

## 2019-07-04 DIAGNOSIS — E119 Type 2 diabetes mellitus without complications: Secondary | ICD-10-CM | POA: Diagnosis not present

## 2019-07-04 DIAGNOSIS — C773 Secondary and unspecified malignant neoplasm of axilla and upper limb lymph nodes: Secondary | ICD-10-CM | POA: Diagnosis not present

## 2019-07-04 DIAGNOSIS — E7800 Pure hypercholesterolemia, unspecified: Secondary | ICD-10-CM | POA: Diagnosis not present

## 2019-07-04 DIAGNOSIS — F329 Major depressive disorder, single episode, unspecified: Secondary | ICD-10-CM | POA: Diagnosis not present

## 2019-07-04 DIAGNOSIS — Z9889 Other specified postprocedural states: Secondary | ICD-10-CM | POA: Diagnosis not present

## 2019-07-04 DIAGNOSIS — I10 Essential (primary) hypertension: Secondary | ICD-10-CM | POA: Diagnosis not present

## 2019-07-04 DIAGNOSIS — Z170 Estrogen receptor positive status [ER+]: Secondary | ICD-10-CM | POA: Diagnosis not present

## 2019-07-05 DIAGNOSIS — C50412 Malignant neoplasm of upper-outer quadrant of left female breast: Secondary | ICD-10-CM | POA: Diagnosis not present

## 2019-07-05 DIAGNOSIS — I10 Essential (primary) hypertension: Secondary | ICD-10-CM | POA: Diagnosis not present

## 2019-07-05 DIAGNOSIS — Z170 Estrogen receptor positive status [ER+]: Secondary | ICD-10-CM | POA: Diagnosis not present

## 2019-07-05 DIAGNOSIS — E7800 Pure hypercholesterolemia, unspecified: Secondary | ICD-10-CM | POA: Diagnosis not present

## 2019-07-05 DIAGNOSIS — C773 Secondary and unspecified malignant neoplasm of axilla and upper limb lymph nodes: Secondary | ICD-10-CM | POA: Diagnosis not present

## 2019-07-05 DIAGNOSIS — Z87891 Personal history of nicotine dependence: Secondary | ICD-10-CM | POA: Diagnosis not present

## 2019-07-05 DIAGNOSIS — E119 Type 2 diabetes mellitus without complications: Secondary | ICD-10-CM | POA: Diagnosis not present

## 2019-07-05 DIAGNOSIS — Z9889 Other specified postprocedural states: Secondary | ICD-10-CM | POA: Diagnosis not present

## 2019-07-05 DIAGNOSIS — F329 Major depressive disorder, single episode, unspecified: Secondary | ICD-10-CM | POA: Diagnosis not present

## 2019-07-06 DIAGNOSIS — C773 Secondary and unspecified malignant neoplasm of axilla and upper limb lymph nodes: Secondary | ICD-10-CM | POA: Diagnosis not present

## 2019-07-06 DIAGNOSIS — Z87891 Personal history of nicotine dependence: Secondary | ICD-10-CM | POA: Diagnosis not present

## 2019-07-06 DIAGNOSIS — E119 Type 2 diabetes mellitus without complications: Secondary | ICD-10-CM | POA: Diagnosis not present

## 2019-07-06 DIAGNOSIS — F329 Major depressive disorder, single episode, unspecified: Secondary | ICD-10-CM | POA: Diagnosis not present

## 2019-07-06 DIAGNOSIS — C50412 Malignant neoplasm of upper-outer quadrant of left female breast: Secondary | ICD-10-CM | POA: Diagnosis not present

## 2019-07-06 DIAGNOSIS — I10 Essential (primary) hypertension: Secondary | ICD-10-CM | POA: Diagnosis not present

## 2019-07-06 DIAGNOSIS — Z170 Estrogen receptor positive status [ER+]: Secondary | ICD-10-CM | POA: Diagnosis not present

## 2019-07-06 DIAGNOSIS — Z9889 Other specified postprocedural states: Secondary | ICD-10-CM | POA: Diagnosis not present

## 2019-07-06 DIAGNOSIS — E7800 Pure hypercholesterolemia, unspecified: Secondary | ICD-10-CM | POA: Diagnosis not present

## 2019-07-09 DIAGNOSIS — Z170 Estrogen receptor positive status [ER+]: Secondary | ICD-10-CM | POA: Diagnosis not present

## 2019-07-09 DIAGNOSIS — C50412 Malignant neoplasm of upper-outer quadrant of left female breast: Secondary | ICD-10-CM | POA: Diagnosis not present

## 2019-07-10 DIAGNOSIS — C50412 Malignant neoplasm of upper-outer quadrant of left female breast: Secondary | ICD-10-CM | POA: Diagnosis not present

## 2019-07-10 DIAGNOSIS — Z170 Estrogen receptor positive status [ER+]: Secondary | ICD-10-CM | POA: Diagnosis not present

## 2019-07-11 DIAGNOSIS — C50412 Malignant neoplasm of upper-outer quadrant of left female breast: Secondary | ICD-10-CM | POA: Diagnosis not present

## 2019-07-11 DIAGNOSIS — Z170 Estrogen receptor positive status [ER+]: Secondary | ICD-10-CM | POA: Diagnosis not present

## 2019-07-12 ENCOUNTER — Telehealth: Payer: Self-pay | Admitting: Genetic Counselor

## 2019-07-12 ENCOUNTER — Encounter: Payer: Self-pay | Admitting: Genetic Counselor

## 2019-07-12 ENCOUNTER — Ambulatory Visit: Payer: Self-pay | Admitting: Genetic Counselor

## 2019-07-12 DIAGNOSIS — Z1379 Encounter for other screening for genetic and chromosomal anomalies: Secondary | ICD-10-CM

## 2019-07-12 DIAGNOSIS — C50412 Malignant neoplasm of upper-outer quadrant of left female breast: Secondary | ICD-10-CM | POA: Diagnosis not present

## 2019-07-12 DIAGNOSIS — Z170 Estrogen receptor positive status [ER+]: Secondary | ICD-10-CM

## 2019-07-12 NOTE — Progress Notes (Signed)
HPI:  Erica Giles was previously seen in the [facility] clinic due to a personal and family history of breast cancer and concerns regarding a hereditary predisposition to cancer. Please refer to our prior cancer genetics clinic note for more information regarding our discussion, assessment and recommendations, at the time. Erica Giles recent genetic test results were disclosed to Erica Giles, as were recommendations warranted by these results. These results and recommendations are discussed in more detail below.  CANCER HISTORY:  Oncology History  Breast cancer of upper-outer quadrant of left female breast ([place])  03/06/2019 Initial Diagnosis   Breast cancer of upper-outer quadrant of left female breast ([place])   04/24/2019 Cancer Staging   Staging form: Breast, AJCC 8th Edition - Clinical: Stage IB (cT1c, cN1, cM0, G2, ER+, PR+, HER2-) - Signed by Derek Jack, MD on 04/24/2019   07/11/2019 Genetic Testing   Negative genetic testing on the common hereditary cancer panel.  The Common Hereditary Gene Panel offered by [facility] includes sequencing and/or deletion duplication testing of the following 48 genes: APC, ATM, AXIN2, BARD1, BMPR1A, BRCA1, BRCA2, BRIP1, CDH1, CDK4, CDKN2A (p14ARF), CDKN2A (p16INK4a), CHEK2, CTNNA1, DICER1, EPCAM (Deletion/duplication testing only), GREM1 (promoter region deletion/duplication testing only), KIT, MEN1, MLH1, MSH2, MSH3, MSH6, MUTYH, NBN, NF1, NHTL1, PALB2, PDGFRA, PMS2, POLD1, POLE, PTEN, RAD50, RAD51C, RAD51D, RNF43, SDHB, SDHC, SDHD, SMAD4, SMARCA4. STK11, TP53, TSC1, TSC2, and VHL.  The following genes were evaluated for sequence changes only: SDHA and HOXB13 c.251G>A variant only. The report date is July 11, 2019.     FAMILY HISTORY:  We obtained a detailed, 4-generation family history.  Significant diagnoses are listed below: Family History  Problem Relation Age of Onset  . Diabetes Mother   . Kidney disease Mother   . Dementia Mother   .  Diabetes Father   . Kidney Stones Sister   . Diabetes Brother   . Kidney Stones Brother   . Scoliosis Son   . Other Daughter        infanct death  . Kidney cancer Maternal Aunt 80  . Breast cancer Paternal Aunt 38  . Other Sister 65       murdered  . Other Sister 7       murdered  . Other Brother        infant death  . Breast cancer Maternal Aunt        dx over 44  . Breast cancer Paternal Aunt 70  . Breast cancer Paternal Aunt        dx over 59    The patient has a son and daughter who are twins and are cancer free.  She had a daughter who died soon after birth.  She has three brother and four sisters.  Two sisters were murdered in childhood, and one brother died soon after birth.  Both parents are deceased.  The patient's mother had five sisters and one brother who were full siblings.  She had multiple half siblings that she is less familiar with.  One sister had breast cancer and one sister had kidney cancer.  There is no other reported family history on the maternal side of cancer.  The patient's father had four brothers and four sisters.  Three of the four sisters had breast cancer, two were 42 or below.  There is no other reported family history of cancer.  Erica Giles is unaware of previous family history of genetic testing for hereditary cancer risks. Patient's maternal ancestors are of Caucasian descent,  and paternal ancestors are of Caucasian descent. There is no reported Ashkenazi Jewish ancestry. There is no known consanguinity.     GENETIC TEST RESULTS: Genetic testing reported out on July 11, 2019 through the Common Hereditary cancer panel found no pathogenic mutations. The Common Hereditary Gene Panel offered by [facility] includes sequencing and/or deletion duplication testing of the following 48 genes: APC, ATM, AXIN2, BARD1, BMPR1A, BRCA1, BRCA2, BRIP1, CDH1, CDK4, CDKN2A (p14ARF), CDKN2A (p16INK4a), CHEK2, CTNNA1, DICER1, EPCAM (Deletion/duplication testing  only), GREM1 (promoter region deletion/duplication testing only), KIT, MEN1, MLH1, MSH2, MSH3, MSH6, MUTYH, NBN, NF1, NHTL1, PALB2, PDGFRA, PMS2, POLD1, POLE, PTEN, RAD50, RAD51C, RAD51D, RNF43, SDHB, SDHC, SDHD, SMAD4, SMARCA4. STK11, TP53, TSC1, TSC2, and VHL.  The following genes were evaluated for sequence changes only: SDHA and HOXB13 c.251G>A variant only. The test report has been scanned into EPIC and is located under the Molecular Pathology section of the Results Review tab.  A portion of the result report is included below for reference.     We discussed with Erica Giles that because current genetic testing is not perfect, it is possible there may be a gene mutation in one of these genes that current testing cannot detect, but that chance is small.  We also discussed, that there could be another gene that has not yet been discovered, or that we have not yet tested, that is responsible for the cancer diagnoses in the family. It is also possible there is a hereditary cause for the cancer in the family that Erica Giles did not inherit and therefore was not identified in Erica Giles testing.  Therefore, it is important to remain in touch with cancer genetics in the future so that we can continue to offer Erica Giles the most up to date genetic testing.   ADDITIONAL GENETIC TESTING: We discussed with Erica Giles that there are other genes that are associated with increased cancer risk that can be analyzed. Should Erica Giles wish to pursue additional genetic testing, we are happy to discuss and coordinate this testing, at any time.    CANCER SCREENING RECOMMENDATIONS: Erica Giles test result is considered negative (normal).  This means that we have not identified a hereditary cause for Erica Giles personal and family history of breast cancer at this time. Most cancers happen by chance and this negative test suggests that Erica Giles cancer may fall into this category.    While reassuring, this does not definitively rule out a  hereditary predisposition to cancer. It is still possible that there could be genetic mutations that are undetectable by current technology. There could be genetic mutations in genes that have not been tested or identified to increase cancer risk.  Therefore, it is recommended she continue to follow the cancer management and screening guidelines provided by Erica Giles oncology and primary healthcare provider.   An individual's cancer risk and medical management are not determined by genetic test results alone. Overall cancer risk assessment incorporates additional factors, including personal medical history, family history, and any available genetic information that may result in a personalized plan for cancer prevention and surveillance  RECOMMENDATIONS FOR FAMILY MEMBERS:  Individuals in this family might be at some increased risk of developing cancer, over the general population risk, simply due to the family history of cancer.  We recommended women in this family have a yearly mammogram beginning at age 55, or 72 years younger than the earliest onset of cancer, an annual clinical breast exam, and perform monthly breast self-exams. Women in this family should  also have a gynecological exam as recommended by their primary provider. All family members should have a colonoscopy by age 71.  FOLLOW-UP: Lastly, we discussed with Erica Giles that cancer genetics is a rapidly advancing field and it is possible that new genetic tests will be appropriate for Erica Giles and/or Erica Giles family members in the future. We encouraged Erica Giles to remain in contact with cancer genetics on an annual basis so we can update Erica Giles personal and family histories and let Erica Giles know of advances in cancer genetics that may benefit this family.   Our contact number was provided. Erica Giles questions were answered to Erica Giles satisfaction, and she knows she is welcome to call us at anytime with additional questions or concerns.   Erica Giles, [place], [facility] Licensed,  Certified Genetic Counselor [email or web address]

## 2019-07-12 NOTE — Telephone Encounter (Signed)
Revealed negative genetic testing.  Discussed that we do not know why she has breast cancer or why there is cancer in the family. It could be due to a different gene that we are not testing, or maybe our current technology may not be able to pick something up.  It will be important for her to keep in contact with genetics to keep up with whether additional testing may be needed. 

## 2019-07-13 DIAGNOSIS — C50412 Malignant neoplasm of upper-outer quadrant of left female breast: Secondary | ICD-10-CM | POA: Diagnosis not present

## 2019-07-13 DIAGNOSIS — Z170 Estrogen receptor positive status [ER+]: Secondary | ICD-10-CM | POA: Diagnosis not present

## 2019-07-16 DIAGNOSIS — C50412 Malignant neoplasm of upper-outer quadrant of left female breast: Secondary | ICD-10-CM | POA: Diagnosis not present

## 2019-07-16 DIAGNOSIS — Z170 Estrogen receptor positive status [ER+]: Secondary | ICD-10-CM | POA: Diagnosis not present

## 2019-07-17 ENCOUNTER — Other Ambulatory Visit (HOSPITAL_COMMUNITY): Payer: Self-pay

## 2019-07-17 DIAGNOSIS — C50412 Malignant neoplasm of upper-outer quadrant of left female breast: Secondary | ICD-10-CM | POA: Diagnosis not present

## 2019-07-17 DIAGNOSIS — Z170 Estrogen receptor positive status [ER+]: Secondary | ICD-10-CM | POA: Diagnosis not present

## 2019-07-18 ENCOUNTER — Ambulatory Visit (HOSPITAL_COMMUNITY): Payer: BC Managed Care – PPO | Admitting: Hematology

## 2019-07-18 ENCOUNTER — Other Ambulatory Visit: Payer: Self-pay

## 2019-07-18 ENCOUNTER — Inpatient Hospital Stay (HOSPITAL_COMMUNITY): Payer: BC Managed Care – PPO | Attending: Hematology

## 2019-07-18 DIAGNOSIS — U071 COVID-19: Secondary | ICD-10-CM | POA: Diagnosis not present

## 2019-07-18 DIAGNOSIS — C773 Secondary and unspecified malignant neoplasm of axilla and upper limb lymph nodes: Secondary | ICD-10-CM | POA: Diagnosis not present

## 2019-07-18 DIAGNOSIS — C50412 Malignant neoplasm of upper-outer quadrant of left female breast: Secondary | ICD-10-CM | POA: Diagnosis not present

## 2019-07-18 DIAGNOSIS — Z170 Estrogen receptor positive status [ER+]: Secondary | ICD-10-CM | POA: Diagnosis not present

## 2019-07-18 LAB — CBC WITH DIFFERENTIAL/PLATELET
Abs Immature Granulocytes: 0.02 10*3/uL (ref 0.00–0.07)
Basophils Absolute: 0 10*3/uL (ref 0.0–0.1)
Basophils Relative: 1 %
Eosinophils Absolute: 0 10*3/uL (ref 0.0–0.5)
Eosinophils Relative: 1 %
HCT: 41.3 % (ref 36.0–46.0)
Hemoglobin: 13.2 g/dL (ref 12.0–15.0)
Immature Granulocytes: 1 %
Lymphocytes Relative: 31 %
Lymphs Abs: 0.8 10*3/uL (ref 0.7–4.0)
MCH: 29.5 pg (ref 26.0–34.0)
MCHC: 32 g/dL (ref 30.0–36.0)
MCV: 92.4 fL (ref 80.0–100.0)
Monocytes Absolute: 0.4 10*3/uL (ref 0.1–1.0)
Monocytes Relative: 15 %
Neutro Abs: 1.3 10*3/uL — ABNORMAL LOW (ref 1.7–7.7)
Neutrophils Relative %: 51 %
Platelets: 158 10*3/uL (ref 150–400)
RBC: 4.47 MIL/uL (ref 3.87–5.11)
RDW: 14 % (ref 11.5–15.5)
WBC: 2.5 10*3/uL — ABNORMAL LOW (ref 4.0–10.5)
nRBC: 0 % (ref 0.0–0.2)

## 2019-07-18 LAB — COMPREHENSIVE METABOLIC PANEL
ALT: 26 U/L (ref 0–44)
AST: 16 U/L (ref 15–41)
Albumin: 3.7 g/dL (ref 3.5–5.0)
Alkaline Phosphatase: 130 U/L — ABNORMAL HIGH (ref 38–126)
Anion gap: 8 (ref 5–15)
BUN: 22 mg/dL (ref 8–23)
CO2: 26 mmol/L (ref 22–32)
Calcium: 8.8 mg/dL — ABNORMAL LOW (ref 8.9–10.3)
Chloride: 101 mmol/L (ref 98–111)
Creatinine, Ser: 1.01 mg/dL — ABNORMAL HIGH (ref 0.44–1.00)
GFR calc Af Amer: >60 mL/min (ref >60)
GFR calc non Af Amer: 60 mL/min — ABNORMAL LOW (ref >60)
Glucose, Bld: 214 mg/dL — ABNORMAL HIGH (ref 70–99)
Potassium: 3.9 mmol/L (ref 3.5–5.1)
Sodium: 135 mmol/L (ref 135–145)
Total Bilirubin: 0.6 mg/dL (ref 0.3–1.2)
Total Protein: 7 g/dL (ref 6.5–8.1)

## 2019-07-23 DIAGNOSIS — R05 Cough: Secondary | ICD-10-CM | POA: Diagnosis not present

## 2019-07-23 DIAGNOSIS — R0989 Other specified symptoms and signs involving the circulatory and respiratory systems: Secondary | ICD-10-CM | POA: Diagnosis not present

## 2019-07-24 DIAGNOSIS — U071 COVID-19: Secondary | ICD-10-CM | POA: Diagnosis not present

## 2019-07-25 ENCOUNTER — Other Ambulatory Visit: Payer: Self-pay

## 2019-07-25 ENCOUNTER — Telehealth (HOSPITAL_COMMUNITY): Payer: Self-pay | Admitting: Surgery

## 2019-07-25 ENCOUNTER — Encounter (HOSPITAL_COMMUNITY): Payer: Self-pay | Admitting: Hematology

## 2019-07-25 ENCOUNTER — Inpatient Hospital Stay (HOSPITAL_BASED_OUTPATIENT_CLINIC_OR_DEPARTMENT_OTHER): Payer: BC Managed Care – PPO | Admitting: Hematology

## 2019-07-25 DIAGNOSIS — Z170 Estrogen receptor positive status [ER+]: Secondary | ICD-10-CM

## 2019-07-25 DIAGNOSIS — C50412 Malignant neoplasm of upper-outer quadrant of left female breast: Secondary | ICD-10-CM

## 2019-07-25 NOTE — Patient Instructions (Signed)
[facility]  Discharge Instructions:  You had a phone visit with Dr. Katragadda today. _______________________________________________________________  Thank you for choosing [facility] to provide your oncology and hematology care.  To afford each patient quality time with our providers, please arrive at least 15 minutes before your scheduled appointment.  You need to re-schedule your appointment if you arrive 10 or more minutes late.  We strive to give you quality time with our providers, and arriving late affects you and other patients whose appointments are after yours.  Also, if you no show three or more times for appointments you may be dismissed from the clinic.  Again, thank you for choosing [facility]. Our hope is that these requests will allow you access to exceptional care and in a timely manner. _______________________________________________________________  If you have questions after your visit, please contact our office at [phone] between the hours of 8:30 a.m. and 5:00 p.m. Voicemails left after 4:30 p.m. will not be returned until the following business day. _______________________________________________________________  For prescription refill requests, have your pharmacy contact our office. _______________________________________________________________  Recommendations made by the consultant and any test results will be sent to your referring physician. _______________________________________________________________ 

## 2019-07-25 NOTE — Telephone Encounter (Signed)
This pt left a voice mail stating that she is positive for Covid.  Per Caryl Pina and Dr. Delton Coombes, pt changed to a phone visit today.

## 2019-07-25 NOTE — Progress Notes (Signed)
Virtual Visit via Telephone Note  I connected with Erica Giles on 07/25/19 at  2:50 PM EST by telephone and verified that I am speaking with the correct person using two identifiers.   I discussed the limitations, risks, security and privacy concerns of performing an evaluation and management service by telephone and the availability of in person appointments. I also discussed with the patient that there may be a patient responsible charge related to this service. The patient expressed understanding and agreed to proceed.   History of Present Illness: She is seen in our clinic for stage Ib left breast infiltrating ductal carcinoma.  She underwent lumpectomy and sentinel lymph node biopsy on 03/27/2019 showing grade 2 IDC, 1.1 cm, negative margins, negative LVSI, perineural invasion, 1 out of 1 lymph node positive for metastatic carcinoma measuring 2.7 mm.  She also has extensive family history of malignancies.   Observations/Objective: She was reportedly diagnosed with COVID-19 infection yesterday.  She is having dry cough.  She is being scheduled to get infusions for COVID-19.  She apparently finished radiation therapy last Wednesday.  Denies any recent bone pains or fractures.  Taking vitamin D and calcium daily.  She is continuing to work full-time.  Assessment and Plan:  1.  Stage Ib (T1CN1) left breast IDC: -She is continuing anastrozole without any major problems. -XRT completed on 07/18/2019. -We reviewed her labs.  Alkaline phosphatase is mildly elevated for the first time.  Could be from recent radiation or infection.  Hence we will repeat it in 4 months when she comes back for follow-up. -CBC reviewed by me shows white count of 2.5 with 51% neutrophils.  Again this could be from myelosuppression from radiation therapy.  Labs were done on 07/18/2019.  2.  Family history: -3 paternal aunts had breast cancer.  A maternal aunt had breast cancer. -[facility] germline mutation testing was  negative.  3.  Bone health: -DEXA scan on 04/19/2019 shows T score of -1. -She will continue calcium and vitamin D.  Will check vitamin D level prior to next visit.   Follow Up Instructions: RTC 4 months for follow-up.   I discussed the assessment and treatment plan with the patient. The patient was provided an opportunity to ask questions and all were answered. The patient agreed with the plan and demonstrated an understanding of the instructions.   The patient was advised to call back or seek an in-person evaluation if the symptoms worsen or if the condition fails to improve as anticipated.  I provided 11 minutes of non-face-to-face time during this encounter.   Derek Jack, MD

## 2019-07-27 DIAGNOSIS — Z23 Encounter for immunization: Secondary | ICD-10-CM | POA: Diagnosis not present

## 2019-07-27 DIAGNOSIS — U071 COVID-19: Secondary | ICD-10-CM | POA: Diagnosis not present

## 2019-08-07 ENCOUNTER — Ambulatory Visit (INDEPENDENT_AMBULATORY_CARE_PROVIDER_SITE_OTHER): Payer: BC Managed Care – PPO

## 2019-08-07 ENCOUNTER — Ambulatory Visit
Admission: EM | Admit: 2019-08-07 | Discharge: 2019-08-07 | Disposition: A | Discharge status: Home / Self Care | Payer: BC Managed Care – PPO | Attending: Emergency Medicine | Admitting: Emergency Medicine

## 2019-08-07 ENCOUNTER — Other Ambulatory Visit: Payer: Self-pay

## 2019-08-07 DIAGNOSIS — S9032XA Contusion of left foot, initial encounter: Secondary | ICD-10-CM | POA: Diagnosis not present

## 2019-08-07 DIAGNOSIS — M79672 Pain in left foot: Secondary | ICD-10-CM | POA: Diagnosis not present

## 2019-08-07 DIAGNOSIS — S99922A Unspecified injury of left foot, initial encounter: Secondary | ICD-10-CM

## 2019-08-07 NOTE — ED Triage Notes (Signed)
Pt kicked a container of dog food  On Saturday , pt has left foot pain and bruising

## 2019-08-07 NOTE — ED Provider Notes (Signed)
[place]   [id number] 08/07/19 Arrival Time: [id number]  CC: Left foot pain injury  SUBJECTIVE: History from: patient. Erica Giles is a 64 y.o. female complains of left foot pain/ injury that occurred 1 week ago.  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  Concerned grandson was going to fall down basement stairs.  Localizes the pain to the top of foot.  Describes the pain as constant and discomfort in character.  Has tried OTC medications without relief.  Symptoms are made worse with walking.  Reports breaking toe in the past with similar symptoms.  Complains of associated bruising and swelling.  Denies fever, chills, erythema, ecchymosis, weakness.    ROS: As per HPI.  All other pertinent ROS negative.     Past Medical History:  Diagnosis Date  . Anxiety   . Arthritis   . Depression   . Diabetes mellitus    ORAL MED  . Family history of breast cancer   . GERD (gastroesophageal reflux disease)   . Helicobacter pylori gastritis   . Hiatal hernia   . HNP (herniated nucleus pulposus), lumbar    PT TRYING TO AVOID LUMBAR SURGERY--STATES PAIN IN HER BACK AND SOMETIMES DOWN ONE OR THE OTHER LEG  . Hypertension   . Plantar fasciitis    BILATERAL  . Schatzki's ring   . Shortness of breath    WITH EXERTION-PT RELATES TO HER WEIGHT  . Sleep apnea    MODERATE- PER SLEEP STUDY 06/2010--PT USES CPAP-SETTING IS 11   Past Surgical History:  Procedure Laterality Date  . ABDOMINAL HYSTERECTOMY    . BREAST LUMPECTOMY WITH RADIOACTIVE SEED AND SENTINEL LYMPH NODE BIOPSY Left 03/27/2019   Procedure: LEFT BREAST LUMPECTOMY WITH RADIOACTIVE SEED AND LEFT AXILLARY DEEP SENTINEL LYMPH NODE BIOPSY INJECT BLUE DYE;  Surgeon: Fanny Skates, MD;  Location: [facility];  Service: General;  Laterality: Left;  . CARPAL TUNNEL RELEASE     BILATERAL  . CHOLECYSTECTOMY    . CYST REMOVED     LEFT THUMB AND RT MIDDLE FINGER  . ESOPHAGECTOMY    . TONSILLECTOMY    . TRIGGER  FINGER REPAIR     Allergies  Allergen Reactions  . Percocet [Oxycodone-Acetaminophen] Itching    PT CAN TAKE WITH BENADRYL  . Hydrocodone Itching    PT WOULD TAKE WITH BENADRYL PT WOULD TAKE WITH BENADRYL  . Oxycodone-Acetaminophen Itching    PT CAN TAKE WITH BENADRYL   No current facility-administered medications on file prior to encounter.   Current Outpatient Medications on File Prior to Encounter  Medication Sig Dispense Refill  . anastrozole (ARIMIDEX) 1 MG tablet Take 1 tablet (1 mg total) by mouth daily. 30 tablet 3  . atorvastatin (LIPITOR) 40 MG tablet Take 40 mg by mouth at bedtime.     . cholecalciferol (VITAMIN D3) 25 MCG (1000 UT) tablet Take 2,000-4,000 Units by mouth See admin instructions. 2000 mcg daily, except once a week, take 4000 mcg daily    . cyanocobalamin (,VITAMIN B-12,) 1000 MCG/ML injection Inject 1,000 mcg into the muscle every 30 (thirty) days.     . DULoxetine (CYMBALTA) 60 MG capsule Take 60 mg by mouth daily.    . Empagliflozin-metFORMIN HCl ER (SYNJARDY XR) 12.10-998 MG TB24 Take 2 tablets by mouth every morning.     . gabapentin (NEURONTIN) 300 MG capsule Take 900 mg by mouth 3 (three) times daily as needed (pain).     Marland Kitchen glipiZIDE (GLUCOTROL  XL) 10 MG 24 hr tablet Take 10 mg by mouth daily.    Marland Kitchen HYDROcodone-acetaminophen (NORCO) 5-325 MG tablet Take 1-2 tablets by mouth every 6 (six) hours as needed for moderate pain or severe pain. (Patient not taking: Reported on 04/24/2019) 20 tablet 0  . ibuprofen (ADVIL) 800 MG tablet as needed.     . insulin aspart protamine- aspart (NOVOLOG MIX 70/30) (70-30) 100 UNIT/ML injection Inject 50 Units into the skin 2 (two) times daily with a meal.     . lisinopril (PRINIVIL,ZESTRIL) 20 MG tablet Take 20 mg by mouth at bedtime.    . pantoprazole (PROTONIX) 40 MG tablet Take 1 tablet (40 mg total) by mouth 2 (two) times daily. (Patient taking differently: Take 40 mg by mouth daily. ) 60 tablet 11  . SYRINGE-NEEDLE,  DISP, 3 ML (B-D 3CC LUER-LOK SYR 25GX5/8") 25G X 5/8" 3 ML MISC BD Luer-Lok Syringe 3 mL 25 x 5/8"  USE TO INJECT VITAMIN B12 ONCE A MONTH.    . vitamin B-12 (CYANOCOBALAMIN) 1000 MCG tablet Take 1,000 mcg by mouth daily.     Social History   Socioeconomic History  . Marital status: Married    Spouse name: Not on file  . Number of children: 2  . Years of education: Not on file  . Highest education level: Not on file  Occupational History  . Occupation: Scientist, product/process development: [employer]  Tobacco Use  . Smoking status: Former Smoker    Packs/day: 1.00    Years: 25.00    Pack years: 25.00    Quit date: 03/05/2008    Years since quitting: 11.4  . Smokeless tobacco: Never Used  . Tobacco comment: 03/2008  Substance and Sexual Activity  . Alcohol use: No  . Drug use: No  . Sexual activity: Not on file    Comment: hysterectomy  Other Topics Concern  . Not on file  Social History Narrative  . Not on file   Social Determinants of Health   Financial Resource Strain: Medium Risk  . Difficulty of Paying Living Expenses: Somewhat hard  Food Insecurity: No Food Insecurity  . Worried About Charity fundraiser in the Last Year: Never true  . Ran Out of Food in the Last Year: Never true  Transportation Needs: No Transportation Needs  . Lack of Transportation (Medical): No  . Lack of Transportation (Non-Medical): No  Physical Activity: Inactive  . Days of Exercise per Week: 0 days  . Minutes of Exercise per Session: 0 min  Stress: Stress Concern Present  . Feeling of Stress : To some extent  Social Connections: Not Isolated  . Frequency of Communication with Friends and Family: Three times a week  . Frequency of Social Gatherings with Friends and Family: Once a week  . Attends Religious Services: More than 4 times per year  . Active Member of Clubs or Organizations: Yes  . Attends Archivist Meetings: More than 4 times per year  . Marital Status: Married    Human resources officer Violence: Not At Risk  . Fear of Current or Ex-Partner: No  . Emotionally Abused: No  . Physically Abused: No  . Sexually Abused: No   Family History  Problem Relation Age of Onset  . Diabetes Mother   . Kidney disease Mother   . Dementia Mother   . Diabetes Father   . Kidney Stones Sister   . Diabetes Brother   . Kidney Stones Brother   .  Scoliosis Son   . Other Daughter        infanct death  . Kidney cancer Maternal Aunt 80  . Breast cancer Paternal Aunt 11  . Other Sister 20       murdered  . Other Sister 7       murdered  . Other Brother        infant death  . Breast cancer Maternal Aunt        dx over 9  . Breast cancer Paternal Aunt 64  . Breast cancer Paternal Aunt        dx over 15    OBJECTIVE:  Vitals:   08/07/19 0933  BP: 107/64  Pulse: 88  Resp: 18  Temp: 98.1 F (36.7 C)  SpO2: 94%    General appearance: ALERT; in no acute distress.  Head: NCAT Lungs: Normal respiratory effort CV: Dorsalis pedis pulse 2+. Cap refill < 2 seconds Musculoskeletal: LT foot Inspection: Skin warm, dry, clear and intact without obvious erythema, effusion, or ecchymosis.  Palpation: TTP over distal dorsal aspect of LT foot ROM: FROM active and passive Strength:  5/5 dorsiflexion, 5/5 plantar flexion Skin: warm and dry Neurologic: Ambulates with antalgic gait Psychological: alert and cooperative; normal mood and affect  DIAGNOSTIC STUDIES:  DG Foot Complete Left  Result Date: 08/07/2019 CLINICAL DATA:  Left foot pain and bruising after kicking a dog food container on Saturday. EXAM: LEFT FOOT - COMPLETE 3+ VIEW COMPARISON:  None. FINDINGS: No acute fracture or dislocation. Mild degenerative changes of the first MTP joint. Plantar and Achilles enthesophytes. Soft tissues are unremarkable. IMPRESSION: No acute osseous abnormality. Electronically Signed   By: Titus Dubin M.D.   On: 08/07/2019 09:53    X-rays negative for bony abnormalities  including fracture, or dislocation.  No soft tissue swelling.    I have reviewed the x-rays myself and the radiologist interpretation. I am in agreement with the radiologist interpretation.     ASSESSMENT & PLAN:  1. Injury of left foot, initial encounter   2. Left foot pain    X-rays negative for fracture or dislocation Continue conservative management of rest, ice, and elevation Use OTC ibuprofen and/or tylenol as needed for pain Wear cam walker at home as needed for comfort Follow up with PCP if symptoms persist Return or go to the ER if you have any new or worsening symptoms (fever, chills, chest pain, increased redness, swelling, bruising, deformity, etc...)   Reviewed expectations re: course of current medical issues. Questions answered. Outlined signs and symptoms indicating need for more acute intervention. Patient verbalized understanding. After Visit Summary given.    Lestine Box, PA-C 08/07/19 1009

## 2019-08-07 NOTE — Discharge Instructions (Signed)
X-rays negative for fracture or dislocation Continue conservative management of rest, ice, and elevation Use OTC ibuprofen and/or tylenol as needed for pain Wear cam walker at home as needed for comfort Follow up with PCP if symptoms persist Return or go to the ER if you have any new or worsening symptoms (fever, chills, chest pain, increased redness, swelling, bruising, deformity, etc...)

## 2019-08-15 DIAGNOSIS — F329 Major depressive disorder, single episode, unspecified: Secondary | ICD-10-CM | POA: Diagnosis not present

## 2019-08-15 DIAGNOSIS — E119 Type 2 diabetes mellitus without complications: Secondary | ICD-10-CM | POA: Diagnosis not present

## 2019-08-15 DIAGNOSIS — Z9889 Other specified postprocedural states: Secondary | ICD-10-CM | POA: Diagnosis not present

## 2019-08-15 DIAGNOSIS — Z170 Estrogen receptor positive status [ER+]: Secondary | ICD-10-CM | POA: Diagnosis not present

## 2019-08-15 DIAGNOSIS — E7800 Pure hypercholesterolemia, unspecified: Secondary | ICD-10-CM | POA: Diagnosis not present

## 2019-08-15 DIAGNOSIS — C773 Secondary and unspecified malignant neoplasm of axilla and upper limb lymph nodes: Secondary | ICD-10-CM | POA: Diagnosis not present

## 2019-08-15 DIAGNOSIS — C50412 Malignant neoplasm of upper-outer quadrant of left female breast: Secondary | ICD-10-CM | POA: Diagnosis not present

## 2019-08-15 DIAGNOSIS — Z87891 Personal history of nicotine dependence: Secondary | ICD-10-CM | POA: Diagnosis not present

## 2019-08-15 DIAGNOSIS — I10 Essential (primary) hypertension: Secondary | ICD-10-CM | POA: Diagnosis not present

## 2019-08-16 ENCOUNTER — Other Ambulatory Visit (HOSPITAL_COMMUNITY): Payer: Self-pay | Admitting: Hematology

## 2019-08-16 DIAGNOSIS — C50412 Malignant neoplasm of upper-outer quadrant of left female breast: Secondary | ICD-10-CM

## 2019-09-07 DIAGNOSIS — G4733 Obstructive sleep apnea (adult) (pediatric): Secondary | ICD-10-CM | POA: Diagnosis not present

## 2019-10-07 DIAGNOSIS — G4733 Obstructive sleep apnea (adult) (pediatric): Secondary | ICD-10-CM | POA: Diagnosis not present

## 2019-10-09 DIAGNOSIS — C50412 Malignant neoplasm of upper-outer quadrant of left female breast: Secondary | ICD-10-CM | POA: Diagnosis not present

## 2019-10-18 DIAGNOSIS — G5600 Carpal tunnel syndrome, unspecified upper limb: Secondary | ICD-10-CM | POA: Diagnosis not present

## 2019-10-18 DIAGNOSIS — Z6836 Body mass index (BMI) 36.0-36.9, adult: Secondary | ICD-10-CM | POA: Diagnosis not present

## 2019-10-18 DIAGNOSIS — R202 Paresthesia of skin: Secondary | ICD-10-CM | POA: Diagnosis not present

## 2019-10-25 ENCOUNTER — Other Ambulatory Visit: Payer: Self-pay

## 2019-10-25 ENCOUNTER — Ambulatory Visit: Payer: BC Managed Care – PPO | Admitting: Neurology

## 2019-10-25 ENCOUNTER — Encounter: Payer: Self-pay | Admitting: Neurology

## 2019-10-25 VITALS — BP 119/53 | HR 79 | Ht 65.0 in | Wt 218.5 lb

## 2019-10-25 DIAGNOSIS — M542 Cervicalgia: Secondary | ICD-10-CM

## 2019-10-25 DIAGNOSIS — E1142 Type 2 diabetes mellitus with diabetic polyneuropathy: Secondary | ICD-10-CM

## 2019-10-25 NOTE — Progress Notes (Signed)
PATIENT: Erica Giles DOB: 1955/12/12  Chief Complaint  Patient presents with  . Carpal Tunnel/Neck Pain    Reports numbness/tingling in hands and fingers. History of bilateral carpal tunnel release surgery. 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 She has neck pain from looking down all day.  She also has concerns about neuropathy since this is a problem in her feet.  Marland Kitchen PCP    Rory Percy, MD     HISTORICAL  Erica Giles is a 64 year old female, seen in request by her primary care physician Dr. Rory Percy for evaluation of paresthesia of bilateral hands, had a history of bilateral carpal tunnel release surgery, initial evaluation was on Oct 25, 2019.  I have reviewed and summarized the referring note from the referring physician.  She had past medical history of hyperlipidemia, diabetes, hypertension, left breast cancer, status post left lobectomy in 2020 followed by radiation therapy but not chemotherapy  She has developed bilateral feet paresthesia over the years, was diagnosed with diabetic peripheral neuropathy, complains of bilateral plantar surface numbness tingling, deep achy pain after bearing weight, gradually getting worse, now extending to bilateral ankle level, she also had a history of left ankle injury, complains of left ankle pain and swelling at the end of the day.  She fell after missing a few steps, with complex minimum displaced spiral oblique fracture through the proximal left humerus in 2017, has to stay in left shoulder sling for 5 months, since then, she noticed gradual onset neck pain, getting worse since 2019, radiating pain to bilateral shoulders, cracking sound with head movement, since 2020, she also noticed recurrent bilateral finger paresthesia, left worse than right, involving all 5 fingers, she works as a Designer, multimedia, in front of the computer, on the phone all the time, had a history of bilateral carpal tunnel release surgery few years ago, which has helped her  symptoms  She also complains of urinary urgency, no incontinence  She has been treated with Cymbalta 60 mg daily, gabapentin 300 mg 3 tablets 3 times a day, which has been helpful  Laboratory evaluation in February 2021: CMP showed elevated blood glucose 214, creatinine of 1.0, CBC showed mild decreased WBC 2.5   REVIEW OF SYSTEMS: Full 14 system review of systems performed and notable only for as above  All other review of systems were negative.  ALLERGIES: Allergies  Allergen Reactions  . Percocet [Oxycodone-Acetaminophen] Itching    PT CAN TAKE WITH BENADRYL  . Hydrocodone Itching    PT WOULD TAKE WITH BENADRYL PT WOULD TAKE WITH BENADRYL  . Oxycodone-Acetaminophen Itching    PT CAN TAKE WITH BENADRYL    HOME MEDICATIONS: Current Outpatient Medications  Medication Sig Dispense Refill  . anastrozole (ARIMIDEX) 1 MG tablet TAKE ONE TABLET BY MOUTH DAILY 30 tablet 3  . atorvastatin (LIPITOR) 40 MG tablet Take 40 mg by mouth at bedtime.     . cholecalciferol (VITAMIN D3) 25 MCG (1000 UT) tablet Take 2,000-4,000 Units by mouth See admin instructions. 2000 mcg daily, except once a week, take 4000 mcg daily    . cyanocobalamin (,VITAMIN B-12,) 1000 MCG/ML injection Inject 1,000 mcg into the muscle every 30 (thirty) days.     . DULoxetine (CYMBALTA) 60 MG capsule Take 60 mg by mouth daily.    . Empagliflozin-metFORMIN HCl ER (SYNJARDY XR) 12.10-998 MG TB24 Take 2 tablets by mouth every morning.     . gabapentin (NEURONTIN) 300 MG capsule Take 900 mg by  mouth 3 (three) times daily as needed (pain).     Marland Kitchen ibuprofen (ADVIL) 800 MG tablet as needed.     . insulin aspart protamine- aspart (NOVOLOG MIX 70/30) (70-30) 100 UNIT/ML injection Inject 50 Units into the skin 2 (two) times daily with a meal.     . lisinopril (PRINIVIL,ZESTRIL) 20 MG tablet Take 20 mg by mouth at bedtime.    . pantoprazole (PROTONIX) 40 MG tablet Take 1 tablet (40 mg total) by mouth 2 (two) times daily. (Patient  taking differently: Take 40 mg by mouth daily. ) 60 tablet 11  . SYRINGE-NEEDLE, DISP, 3 ML (B-D 3CC LUER-LOK SYR 25GX5/8") 25G X 5/8" 3 ML MISC BD Luer-Lok Syringe 3 mL 25 x 5/8"  USE TO INJECT VITAMIN B12 ONCE A MONTH.    . vitamin B-12 (CYANOCOBALAMIN) 1000 MCG tablet Take 1,000 mcg by mouth daily.     No current facility-administered medications for this visit.    PAST MEDICAL HISTORY: Past Medical History:  Diagnosis Date  . Anxiety   . Arthritis   . Breast cancer ([place])    left  . CTS (carpal tunnel syndrome)   . Depression   . Diabetes mellitus    ORAL MED  . Family history of breast cancer   . GERD (gastroesophageal reflux disease)   . Helicobacter pylori gastritis   . Hiatal hernia   . HNP (herniated nucleus pulposus), lumbar    PT TRYING TO AVOID LUMBAR SURGERY--STATES PAIN IN HER BACK AND SOMETIMES DOWN ONE OR THE OTHER LEG  . Hypertension   . Plantar fasciitis    BILATERAL  . Schatzki's ring   . Shortness of breath    WITH EXERTION-PT RELATES TO HER WEIGHT  . Sleep apnea    MODERATE- PER SLEEP STUDY 06/2010--PT USES CPAP-SETTING IS 11    PAST SURGICAL HISTORY: Past Surgical History:  Procedure Laterality Date  . ABDOMINAL HYSTERECTOMY    . BREAST LUMPECTOMY WITH RADIOACTIVE SEED AND SENTINEL LYMPH NODE BIOPSY Left 03/27/2019   Procedure: LEFT BREAST LUMPECTOMY WITH RADIOACTIVE SEED AND LEFT AXILLARY DEEP SENTINEL LYMPH NODE BIOPSY INJECT BLUE DYE;  Surgeon: Fanny Skates, MD;  Location: [facility];  Service: General;  Laterality: Left;  . CARPAL TUNNEL RELEASE     BILATERAL  . CHOLECYSTECTOMY    . CYST REMOVED     LEFT THUMB AND RT MIDDLE FINGER  . ESOPHAGECTOMY    . TONSILLECTOMY    . TRIGGER FINGER REPAIR      FAMILY HISTORY: Family History  Problem Relation Age of Onset  . Diabetes Mother   . Kidney disease Mother   . Dementia Mother   . Diabetes Father   . Kidney Stones Sister   . Diabetes Brother   . Kidney Stones Brother   . Scoliosis Son   .  Other Daughter        infanct death  . Kidney cancer Maternal Aunt 80  . Breast cancer Paternal Aunt 23  . Other Sister 40       murdered  . Other Sister 7       murdered  . Other Brother        infant death  . Breast cancer Maternal Aunt        dx over 31  . Breast cancer Paternal Aunt 63  . Breast cancer Paternal Aunt        dx over 1    SOCIAL HISTORY: Social History   Socioeconomic History  . Marital  status: Married    Spouse name: Not on file  . Number of children: 2  . Years of education: GED  . Highest education level: Not on file  Occupational History  . Occupation: Scientist, product/process development: [employer]  Tobacco Use  . Smoking status: Former Smoker    Packs/day: 1.00    Years: 25.00    Pack years: 25.00    Quit date: 03/05/2008    Years since quitting: 11.6  . Smokeless tobacco: Never Used  . Tobacco comment: 03/2008  Substance and Sexual Activity  . Alcohol use: No  . Drug use: No  . Sexual activity: Not on file    Comment: hysterectomy  Other Topics Concern  . Not on file  Social History Narrative   Lives at home with her husband.   Right-handed.   Caffeine use: 20 ounces per day.   Social Determinants of Health   Financial Resource Strain: Medium Risk  . Difficulty of Paying Living Expenses: Somewhat hard  Food Insecurity: No Food Insecurity  . Worried About Charity fundraiser in the Last Year: Never true  . Ran Out of Food in the Last Year: Never true  Transportation Needs: No Transportation Needs  . Lack of Transportation (Medical): No  . Lack of Transportation (Non-Medical): No  Physical Activity: Inactive  . Days of Exercise per Week: 0 days  . Minutes of Exercise per Session: 0 min  Stress: Stress Concern Present  . Feeling of Stress : To some extent  Social Connections: Not Isolated  . Frequency of Communication with Friends and Family: Three times a week  . Frequency of Social Gatherings with Friends and Family: Once a week   . Attends Religious Services: More than 4 times per year  . Active Member of Clubs or Organizations: Yes  . Attends Archivist Meetings: More than 4 times per year  . Marital Status: Married  Human resources officer Violence: Not At Risk  . Fear of Current or Ex-Partner: No  . Emotionally Abused: No  . Physically Abused: No  . Sexually Abused: No     PHYSICAL EXAM   Vitals:   10/25/19 1352  BP: (!) 119/53  Pulse: 79  Weight: 218 lb 8 oz (99.1 kg)  Height: 5\' 5"  (1.651 m)    Not recorded      Body mass index is 36.36 kg/m.  PHYSICAL EXAMNIATION:  Gen: NAD, conversant, well nourised, well groomed                     Cardiovascular: Regular rate rhythm, no peripheral edema, warm, nontender. Eyes: Conjunctivae clear without exudates or hemorrhage Neck: Supple, no carotid bruits. Pulmonary: Clear to auscultation bilaterally   NEUROLOGICAL EXAM:  MENTAL STATUS: Speech:    Speech is normal; fluent and spontaneous with normal comprehension.  Cognition:     Orientation to time, place and person     Normal recent and remote memory     Normal Attention span and concentration     Normal Language, naming, repeating,spontaneous speech     Fund of knowledge   CRANIAL NERVES: CN II: Visual fields are full to confrontation. Pupils are round equal and briskly reactive to light. CN III, IV, VI: extraocular movement are normal. No ptosis. CN V: Facial sensation is intact to light touch CN VII: Face is symmetric with normal eye closure  CN VIII: Hearing is normal to causal conversation. CN IX, X: Phonation is normal. CN  XI: Head turning and shoulder shrug are intact  MOTOR: There is no pronator drift of out-stretched arms. Muscle bulk and tone are normal. Muscle strength is normal.  REFLEXES: Reflexes are 2+ and symmetric at the biceps, triceps, knees, and absent at ankles. Plantar responses are flexor.  SENSORY: Mildly length dependent decreased light touch, pinprick   COORDINATION: There is no trunk or limb dysmetria noted.  GAIT/STANCE: Able to get up from seated position arms crossed, mildly antalgic, steady,   DIAGNOSTIC DATA (LABS, IMAGING, TESTING) - I reviewed patient records, labs, notes, testing and imaging myself where available.   ASSESSMENT AND PLAN  Davonda C Sotak is a 64 y.o. female   Diabetic peripheral neuropathy History of bilateral carpal tunnel release surgery 2 years history of recurrent bilateral hands paresthesia, worsening neck pain, radiating pain to bilateral shoulder, she had a history of fall injury with shattered left humerus in the past  Need to rule out cervical radiculopathy MRI of cervical spine  EMG nerve conduction study  Continue Cymbalta 60 mg daily, gabapentin   Marcial Pacas, M.D. Ph.D.  [facility] [address], [place], [place] [address] Ph: [phone] Fax: [phone]  CC: Rory Percy, MD

## 2019-10-31 ENCOUNTER — Telehealth: Payer: Self-pay | Admitting: Neurology

## 2019-10-31 ENCOUNTER — Other Ambulatory Visit: Payer: Self-pay

## 2019-10-31 ENCOUNTER — Encounter: Payer: BC Managed Care – PPO | Admitting: Neurology

## 2019-10-31 ENCOUNTER — Ambulatory Visit (INDEPENDENT_AMBULATORY_CARE_PROVIDER_SITE_OTHER): Payer: BC Managed Care – PPO | Admitting: Neurology

## 2019-10-31 DIAGNOSIS — R202 Paresthesia of skin: Secondary | ICD-10-CM | POA: Diagnosis not present

## 2019-10-31 DIAGNOSIS — E1142 Type 2 diabetes mellitus with diabetic polyneuropathy: Secondary | ICD-10-CM

## 2019-10-31 DIAGNOSIS — Z0289 Encounter for other administrative examinations: Secondary | ICD-10-CM

## 2019-10-31 DIAGNOSIS — M542 Cervicalgia: Secondary | ICD-10-CM

## 2019-10-31 NOTE — Telephone Encounter (Signed)
Ordered MRI of cervical spine, please make sure she is on schedule for it

## 2019-10-31 NOTE — Procedures (Addendum)
Full Name: Audia Flinchum Gender: Female MRN #: [id number] Date of Birth: 05-06-1956    Visit Date: 10/31/2019 08:03 Age: 64 Years Examining Physician: Marcial Pacas, MD  Referring Physician: Marcial Pacas, MD Height: 5 feet 5 inch History:   65 years old female, with history of left humeral fracture, bilateral carpal tunnel release surgery, complains of neck pain, radiating pain to bilateral shoulder, intermittent bilateral hands paresthesia  Summary of the test.  Nerve conduction study: Bilateral ulnar sensory and motor responses were normal. Bilateral median motor responses were normal. Right median sensory response showed mildly prolonged peak latency, with normal snap amplitude. Left median sensory response was normal. Left median mixed response was 0.5 ms prolonged compared to ipsilateral ulnar mixed response.  Right sural, superficial peroneal sensory responses were normal. Right tibial, superficial peroneal EDB motor responses were normal.  Electromyography:  Selected needle examination of bilateral upper extremity muscles and bilateral cervical paraspinal muscles were normal.   Conclusion: This is a mild abnormal study. There is electrodiagnostic evidence of mild bilateral median neuropathy across the wrist, consistent with mild bilateral carpal tunnel syndromes. There is no evidence of bilateral cervical radiculopathy. There is no evidence of large fiber peripheral neuropathy.    ------------------------------- Marcial Pacas M.D. PhD  [facility] [address], [place] [address] Tel: [phone] Fax: [phone]  Verbal informed consent was obtained from the patient, patient was informed of potential risk of procedure, including bruising, bleeding, hematoma formation, infection, muscle weakness, muscle pain, numbness, among others.         [place]    Nerve / Sites Muscle Latency Ref. Amplitude Ref. Rel Amp Segments Distance Velocity Ref. Area    ms  ms mV mV %  cm m/s m/s mVms  R Median - APB     Wrist APB 4.0 ?4.4 5.0 ?4.0 100 Wrist - APB 7   13.6     Upper arm APB 8.1  4.6  93.4 Upper arm - Wrist 22 53 ?49 14.2  L Median - APB     Wrist APB 3.5 ?4.4 5.5 ?4.0 100 Wrist - APB 7   16.8     Upper arm APB 7.4  5.2  93.5 Upper arm - Wrist 21 53 ?49 14.5  R Ulnar - ADM     Wrist ADM 2.6 ?3.3 6.0 ?6.0 100 Wrist - ADM 7   20.0     B.Elbow ADM 6.0  6.6  111 B.Elbow - Wrist 21 60 ?49 22.3     A.Elbow ADM 7.4  6.2  93.7 A.Elbow - B.Elbow 10 75 ?49 22.6         A.Elbow - Wrist      L Ulnar - ADM     Wrist ADM 2.5 ?3.3 9.4 ?6.0 100 Wrist - ADM 7   28.7     B.Elbow ADM 5.5  8.6  91.9 B.Elbow - Wrist 20 66 ?49 29.0     A.Elbow ADM 7.0  8.6  100 A.Elbow - B.Elbow 10 67 ?49 29.3         A.Elbow - Wrist      R Peroneal - EDB     Ankle EDB 4.6 ?6.5 3.7 ?2.0 100 Ankle - EDB 9   9.8     Fib head EDB 11.2  3.1  84.2 Fib head - Ankle 29 44 ?44 9.2     Pop fossa EDB 13.5  3.0  95 Pop fossa - Fib head  10 44 ?44 8.8         Pop fossa - Ankle      R Tibial - AH     Ankle AH 4.0 ?5.8 4.5 ?4.0 100 Ankle - AH 9   15.1     Pop fossa AH 12.9  2.7  59.9 Pop fossa - Ankle 37 41 ?41 9.4                    SNC    Nerve / Sites Rec. Site Peak Lat Ref.  Amp Ref. Segments Distance Peak Diff Ref.    ms ms V V  cm ms ms  R Sural - Ankle (Calf)     Calf Ankle 4.4 ?4.4 8 ?6 Calf - Ankle 14    R Superficial peroneal - Ankle     Lat leg Ankle 4.3 ?4.4 6 ?6 Lat leg - Ankle 14    L Median, Ulnar - Transcarpal comparison     Median Palm Wrist 2.2 ?2.2 74 ?35 Median Palm - Wrist 8       Ulnar Palm Wrist 1.7 ?2.2 30 ?12 Ulnar Palm - Wrist 8          Median Palm - Ulnar Palm  0.5 ?0.4  R Median - Orthodromic (Dig II, Mid palm)     Dig II Wrist 3.7 ?3.4 5 ?10 Dig II - Wrist 13    L Median - Orthodromic (Dig II, Mid palm)     Dig II Wrist 3.1 ?3.4 11 ?10 Dig II - Wrist 13    R Ulnar - Orthodromic, (Dig V, Mid palm)     Dig V Wrist 2.4 ?3.1 12 ?5 Dig V - Wrist 11      L Ulnar - Orthodromic, (Dig V, Mid palm)     Dig V Wrist 2.4 ?3.1 10 ?5 Dig V - Wrist 35                     F  Wave    Nerve F Lat Ref.   ms ms  R Tibial - AH 52.1 ?56.0  R Ulnar - ADM 26.9 ?32.0  L Ulnar - ADM 26.5 ?32.0           EMG Summary Table    Spontaneous MUAP Recruitment  Muscle IA Fib PSW Fasc Other Amp Dur. Poly Pattern  L. First dorsal interosseous Normal None None None _______ Normal Normal Normal Normal  L. Pronator teres Normal None None None _______ Normal Normal Normal Normal  L. Brachioradialis Normal None None None _______ Normal Normal Normal Normal  L. Deltoid Normal None None None _______ Normal Normal Normal Normal  L. Biceps brachii Normal None None None _______ Normal Normal Normal Normal  R. First dorsal interosseous Normal None None None _______ Normal Normal Normal Normal  R. Pronator teres Normal None None None _______ Normal Normal Normal Normal  R. Biceps brachii Normal None None None _______ Normal Normal Normal Normal  R. Deltoid Normal None None None _______ Normal Normal Normal Normal  R. Triceps brachii Normal None None None _______ Normal Normal Normal Normal  L. Triceps brachii Normal None None None _______ Normal Normal Normal Normal  R. Cervical paraspinals Normal None None None _______ Normal Normal Normal Normal

## 2019-11-06 NOTE — Telephone Encounter (Signed)
Noted, no to the covid questions MR Cervical spine wo contrast Dr. Willette Pa Auth: [id number] (exp. 11/02/19 to 04/29/20). Patient is scheduled at [facility] for 11/07/19.

## 2019-11-07 ENCOUNTER — Ambulatory Visit (INDEPENDENT_AMBULATORY_CARE_PROVIDER_SITE_OTHER): Payer: BC Managed Care – PPO

## 2019-11-07 ENCOUNTER — Other Ambulatory Visit: Payer: Self-pay

## 2019-11-07 DIAGNOSIS — E1142 Type 2 diabetes mellitus with diabetic polyneuropathy: Secondary | ICD-10-CM

## 2019-11-07 DIAGNOSIS — M542 Cervicalgia: Secondary | ICD-10-CM

## 2019-11-07 DIAGNOSIS — G4733 Obstructive sleep apnea (adult) (pediatric): Secondary | ICD-10-CM | POA: Diagnosis not present

## 2019-11-16 DIAGNOSIS — Z87891 Personal history of nicotine dependence: Secondary | ICD-10-CM | POA: Diagnosis not present

## 2019-11-16 DIAGNOSIS — S52602A Unspecified fracture of lower end of left ulna, initial encounter for closed fracture: Secondary | ICD-10-CM | POA: Diagnosis not present

## 2019-11-16 DIAGNOSIS — W010XXA Fall on same level from slipping, tripping and stumbling without subsequent striking against object, initial encounter: Secondary | ICD-10-CM | POA: Diagnosis not present

## 2019-11-16 DIAGNOSIS — S52592A Other fractures of lower end of left radius, initial encounter for closed fracture: Secondary | ICD-10-CM | POA: Diagnosis not present

## 2019-11-16 DIAGNOSIS — S52502A Unspecified fracture of the lower end of left radius, initial encounter for closed fracture: Secondary | ICD-10-CM | POA: Diagnosis not present

## 2019-11-16 DIAGNOSIS — S52612A Displaced fracture of left ulna styloid process, initial encounter for closed fracture: Secondary | ICD-10-CM | POA: Diagnosis not present

## 2019-11-16 DIAGNOSIS — E7800 Pure hypercholesterolemia, unspecified: Secondary | ICD-10-CM | POA: Diagnosis not present

## 2019-11-16 DIAGNOSIS — Z853 Personal history of malignant neoplasm of breast: Secondary | ICD-10-CM | POA: Diagnosis not present

## 2019-11-16 DIAGNOSIS — S59902A Unspecified injury of left elbow, initial encounter: Secondary | ICD-10-CM | POA: Diagnosis not present

## 2019-11-16 DIAGNOSIS — I10 Essential (primary) hypertension: Secondary | ICD-10-CM | POA: Diagnosis not present

## 2019-11-16 DIAGNOSIS — M79602 Pain in left arm: Secondary | ICD-10-CM | POA: Diagnosis not present

## 2019-11-16 DIAGNOSIS — S52615A Nondisplaced fracture of left ulna styloid process, initial encounter for closed fracture: Secondary | ICD-10-CM | POA: Diagnosis not present

## 2019-11-16 DIAGNOSIS — E1140 Type 2 diabetes mellitus with diabetic neuropathy, unspecified: Secondary | ICD-10-CM | POA: Diagnosis not present

## 2019-11-19 ENCOUNTER — Other Ambulatory Visit: Payer: Self-pay

## 2019-11-19 ENCOUNTER — Inpatient Hospital Stay (HOSPITAL_COMMUNITY): Payer: BC Managed Care – PPO | Attending: Hematology

## 2019-11-19 DIAGNOSIS — Z170 Estrogen receptor positive status [ER+]: Secondary | ICD-10-CM | POA: Insufficient documentation

## 2019-11-19 DIAGNOSIS — C50412 Malignant neoplasm of upper-outer quadrant of left female breast: Secondary | ICD-10-CM

## 2019-11-19 DIAGNOSIS — Z9181 History of falling: Secondary | ICD-10-CM | POA: Insufficient documentation

## 2019-11-19 DIAGNOSIS — Z87891 Personal history of nicotine dependence: Secondary | ICD-10-CM | POA: Insufficient documentation

## 2019-11-19 DIAGNOSIS — E1142 Type 2 diabetes mellitus with diabetic polyneuropathy: Secondary | ICD-10-CM | POA: Diagnosis not present

## 2019-11-19 DIAGNOSIS — D649 Anemia, unspecified: Secondary | ICD-10-CM | POA: Insufficient documentation

## 2019-11-19 LAB — CBC WITH DIFFERENTIAL/PLATELET
Abs Immature Granulocytes: 0.02 10*3/uL (ref 0.00–0.07)
Basophils Absolute: 0 10*3/uL (ref 0.0–0.1)
Basophils Relative: 1 %
Eosinophils Absolute: 0.1 10*3/uL (ref 0.0–0.5)
Eosinophils Relative: 2 %
HCT: 35.1 % — ABNORMAL LOW (ref 36.0–46.0)
Hemoglobin: 11.3 g/dL — ABNORMAL LOW (ref 12.0–15.0)
Immature Granulocytes: 0 %
Lymphocytes Relative: 25 %
Lymphs Abs: 1.1 10*3/uL (ref 0.7–4.0)
MCH: 31.3 pg (ref 26.0–34.0)
MCHC: 32.2 g/dL (ref 30.0–36.0)
MCV: 97.2 fL (ref 80.0–100.0)
Monocytes Absolute: 0.4 10*3/uL (ref 0.1–1.0)
Monocytes Relative: 9 %
Neutro Abs: 2.9 10*3/uL (ref 1.7–7.7)
Neutrophils Relative %: 63 %
Platelets: 201 10*3/uL (ref 150–400)
RBC: 3.61 MIL/uL — ABNORMAL LOW (ref 3.87–5.11)
RDW: 13.9 % (ref 11.5–15.5)
WBC: 4.6 10*3/uL (ref 4.0–10.5)
nRBC: 0 % (ref 0.0–0.2)

## 2019-11-19 LAB — COMPREHENSIVE METABOLIC PANEL
ALT: 15 U/L (ref 0–44)
AST: 10 U/L — ABNORMAL LOW (ref 15–41)
Albumin: 4.1 g/dL (ref 3.5–5.0)
Alkaline Phosphatase: 107 U/L (ref 38–126)
Anion gap: 9 (ref 5–15)
BUN: 17 mg/dL (ref 8–23)
CO2: 27 mmol/L (ref 22–32)
Calcium: 9 mg/dL (ref 8.9–10.3)
Chloride: 99 mmol/L (ref 98–111)
Creatinine, Ser: 0.95 mg/dL (ref 0.44–1.00)
GFR calc Af Amer: >60 mL/min (ref >60)
GFR calc non Af Amer: >60 mL/min (ref >60)
Glucose, Bld: 260 mg/dL — ABNORMAL HIGH (ref 70–99)
Potassium: 4 mmol/L (ref 3.5–5.1)
Sodium: 135 mmol/L (ref 135–145)
Total Bilirubin: 0.5 mg/dL (ref 0.3–1.2)
Total Protein: 7 g/dL (ref 6.5–8.1)

## 2019-11-19 LAB — VITAMIN D 25 HYDROXY (VIT D DEFICIENCY, FRACTURES): Vit D, 25-Hydroxy: 30.09 ng/mL (ref 30–100)

## 2019-11-20 ENCOUNTER — Other Ambulatory Visit (HOSPITAL_COMMUNITY): Payer: BC Managed Care – PPO

## 2019-11-20 DIAGNOSIS — S52502A Unspecified fracture of the lower end of left radius, initial encounter for closed fracture: Secondary | ICD-10-CM | POA: Diagnosis not present

## 2019-11-21 DIAGNOSIS — S52562A Barton's fracture of left radius, initial encounter for closed fracture: Secondary | ICD-10-CM | POA: Diagnosis not present

## 2019-11-21 DIAGNOSIS — X58XXXA Exposure to other specified factors, initial encounter: Secondary | ICD-10-CM | POA: Diagnosis not present

## 2019-11-21 DIAGNOSIS — G8918 Other acute postprocedural pain: Secondary | ICD-10-CM | POA: Diagnosis not present

## 2019-11-21 DIAGNOSIS — S52572A Other intraarticular fracture of lower end of left radius, initial encounter for closed fracture: Secondary | ICD-10-CM | POA: Diagnosis not present

## 2019-11-21 DIAGNOSIS — Y999 Unspecified external cause status: Secondary | ICD-10-CM | POA: Diagnosis not present

## 2019-11-27 ENCOUNTER — Other Ambulatory Visit: Payer: Self-pay

## 2019-11-27 ENCOUNTER — Inpatient Hospital Stay (HOSPITAL_BASED_OUTPATIENT_CLINIC_OR_DEPARTMENT_OTHER): Payer: BC Managed Care – PPO | Admitting: Oncology

## 2019-11-27 VITALS — BP 126/48 | HR 78 | Temp 98.9°F | Resp 18 | Wt 217.4 lb

## 2019-11-27 DIAGNOSIS — Z170 Estrogen receptor positive status [ER+]: Secondary | ICD-10-CM | POA: Diagnosis not present

## 2019-11-27 DIAGNOSIS — Z87891 Personal history of nicotine dependence: Secondary | ICD-10-CM | POA: Diagnosis not present

## 2019-11-27 DIAGNOSIS — Z1379 Encounter for other screening for genetic and chromosomal anomalies: Secondary | ICD-10-CM | POA: Diagnosis not present

## 2019-11-27 DIAGNOSIS — E1142 Type 2 diabetes mellitus with diabetic polyneuropathy: Secondary | ICD-10-CM | POA: Diagnosis not present

## 2019-11-27 DIAGNOSIS — D649 Anemia, unspecified: Secondary | ICD-10-CM | POA: Diagnosis not present

## 2019-11-27 DIAGNOSIS — Z9181 History of falling: Secondary | ICD-10-CM | POA: Diagnosis not present

## 2019-11-27 DIAGNOSIS — C50412 Malignant neoplasm of upper-outer quadrant of left female breast: Secondary | ICD-10-CM

## 2019-11-27 NOTE — Progress Notes (Signed)
t       Erica Percy, MD [place] [address]  Malignant neoplasm of upper-outer quadrant of left breast in female, estrogen receptor positive ([place]) - Plan: CBC with Differential, Comprehensive metabolic panel, Vitamin D 25 hydroxy, MM DIAG BREAST TOMO BILATERAL, CANCELED: MM DIAG BREAST TOMO BILATERAL  Genetic testing  DM type 2 with diabetic peripheral neuropathy (HCC)  Anemia, unspecified type - Plan: Iron and TIBC, Ferritin, Vitamin B12, Methylmalonic acid, serum, Folate   HISTORY OF PRESENT ILLNESS: Stage Ib (T1CN1) left breast IDC: -She is continuing anastrozole without any major problems.  She will take this for 10 years given her lymph node involvement. -XRT completed on 07/18/2019.   CURRENT STATUS: Erica Giles 64 y.o. female returns for followup of breast cancer, on anastrozole therapy with curative intent.  She is tolerating anti-endocrine therapy well without significant arthralgias, myalgias, hot flashes.  She is up to date on bone density examinations.  Her next mammogram is due in 02/2020.  She denies any new lumps or bumps on her own examination.  No new pain.  No cough or hemoptysis.  She denies any abdominal bloating or discomfort.  No new neurological deficits.  She tripped and fell resulting in distal left upper extremity fracture requiring surgical fixation by Dr. Apolonio Schneiders.   Review of Systems  Constitutional: Negative.  Negative for chills, fever and weight loss.  HENT: Negative.   Eyes: Negative.   Respiratory: Negative.  Negative for cough.   Cardiovascular: Negative.  Negative for chest pain.  Gastrointestinal: Negative.  Negative for blood in stool, constipation, diarrhea, melena, nausea and vomiting.  Genitourinary: Negative.   Musculoskeletal: Negative.   Skin: Negative.   Neurological: Negative.  Negative for weakness.  Endo/Heme/Allergies: Negative.   Psychiatric/Behavioral: Negative.     Past Medical History:  Diagnosis Date  .  Anxiety   . Arthritis   . Breast cancer ([place])    left  . CTS (carpal tunnel syndrome)   . Depression   . Diabetes mellitus    ORAL MED  . Family history of breast cancer   . GERD (gastroesophageal reflux disease)   . Helicobacter pylori gastritis   . Hiatal hernia   . HNP (herniated nucleus pulposus), lumbar    PT TRYING TO AVOID LUMBAR SURGERY--STATES PAIN IN HER BACK AND SOMETIMES DOWN ONE OR THE OTHER LEG  . Hypertension   . Plantar fasciitis    BILATERAL  . Schatzki's ring   . Shortness of breath    WITH EXERTION-PT RELATES TO HER WEIGHT  . Sleep apnea    MODERATE- PER SLEEP STUDY 06/2010--PT USES CPAP-SETTING IS 11    Past Surgical History:  Procedure Laterality Date  . ABDOMINAL HYSTERECTOMY    . BREAST LUMPECTOMY WITH RADIOACTIVE SEED AND SENTINEL LYMPH NODE BIOPSY Left 03/27/2019   Procedure: LEFT BREAST LUMPECTOMY WITH RADIOACTIVE SEED AND LEFT AXILLARY DEEP SENTINEL LYMPH NODE BIOPSY INJECT BLUE DYE;  Surgeon: Fanny Skates, MD;  Location: [facility];  Service: General;  Laterality: Left;  . CARPAL TUNNEL RELEASE     BILATERAL  . CHOLECYSTECTOMY    . CYST REMOVED     LEFT THUMB AND RT MIDDLE FINGER  . ESOPHAGECTOMY    . TONSILLECTOMY    . TRIGGER FINGER REPAIR      Family History  Problem Relation Age of Onset  . Diabetes Mother   . Kidney disease Mother   . Dementia Mother   . Diabetes Father   .  Kidney Stones Sister   . Diabetes Brother   . Kidney Stones Brother   . Scoliosis Son   . Other Daughter        infanct death  . Kidney cancer Maternal Aunt 80  . Breast cancer Paternal Aunt 73  . Other Sister 35       murdered  . Other Sister 7       murdered  . Other Brother        infant death  . Breast cancer Maternal Aunt        dx over 43  . Breast cancer Paternal Aunt 62  . Breast cancer Paternal Aunt        dx over 79    Social History   Socioeconomic History  . Marital status: Married    Spouse name: Not on file  . Number of children: 2    . Years of education: GED  . Highest education level: Not on file  Occupational History  . Occupation: Scientist, product/process development: [employer]  Tobacco Use  . Smoking status: Former Smoker    Packs/day: 1.00    Years: 25.00    Pack years: 25.00    Quit date: 03/05/2008    Years since quitting: 11.7  . Smokeless tobacco: Never Used  . Tobacco comment: 03/2008  Vaping Use  . Vaping Use: Never used  Substance and Sexual Activity  . Alcohol use: No  . Drug use: No  . Sexual activity: Not on file    Comment: hysterectomy  Other Topics Concern  . Not on file  Social History Narrative   Lives at home with her husband.   Right-handed.   Caffeine use: 20 ounces per day.   Social Determinants of Health   Financial Resource Strain: Medium Risk  . Difficulty of Paying Living Expenses: Somewhat hard  Food Insecurity: No Food Insecurity  . Worried About Charity fundraiser in the Last Year: Never true  . Ran Out of Food in the Last Year: Never true  Transportation Needs: No Transportation Needs  . Lack of Transportation (Medical): No  . Lack of Transportation (Non-Medical): No  Physical Activity: Inactive  . Days of Exercise per Week: 0 days  . Minutes of Exercise per Session: 0 min  Stress: Stress Concern Present  . Feeling of Stress : To some extent  Social Connections: Socially Integrated  . Frequency of Communication with Friends and Family: Three times a week  . Frequency of Social Gatherings with Friends and Family: Once a week  . Attends Religious Services: More than 4 times per year  . Active Member of Clubs or Organizations: Yes  . Attends Archivist Meetings: More than 4 times per year  . Marital Status: Married     PHYSICAL EXAMINATION  ECOG PERFORMANCE STATUS: 1 - Symptomatic but completely ambulatory  Vitals:   11/27/19 1459 11/27/19 1501  BP: (!) 138/40 (!) 126/48  Pulse: 78   Resp: 18   Temp: 98.9 F (37.2 C)   SpO2: 96%      GENERAL:alert, no distress, well nourished, well developed, comfortable, cooperative, obese, smiling and unaccompanied SKIN: skin color, texture, turgor are normal, no rashes or significant lesions HEAD: Normocephalic, No masses, lesions, tenderness or abnormalities EYES: normal EARS: External ears normal OROPHARYNX:not examined, mask in place  NECK: supple LYMPH:  no palpable lymphadenopathy BREAST:left breast with hyperpigmentation at radiation site.  No new lumps of bumps.  No skin changes.  No nipple abnormalities. Right breast is unremarkable. LUNGS: clear to auscultation and percussion HEART: regular rate & rhythm, no murmurs and no gallops ABDOMEN:abdomen soft, non-tender, obese and normal bowel sounds BACK: Back symmetric, no curvature. EXTREMITIES:less then 2 second capillary refill, no joint deformities, effusion, or inflammation, no edema, no skin discoloration, left upper extremity in sling and immobilization cast/splint.  NEURO: alert & oriented x 3 with fluent speech, no focal motor/sensory deficits, gait normal   LABORATORY DATA: CBC    Component Value Date/Time   WBC 4.6 11/19/2019 1309   RBC 3.61 (L) 11/19/2019 1309   HGB 11.3 (L) 11/19/2019 1309   HCT 35.1 (L) 11/19/2019 1309   PLT 201 11/19/2019 1309   MCV 97.2 11/19/2019 1309   MCH 31.3 11/19/2019 1309   MCHC 32.2 11/19/2019 1309   RDW 13.9 11/19/2019 1309   LYMPHSABS 1.1 11/19/2019 1309   MONOABS 0.4 11/19/2019 1309   EOSABS 0.1 11/19/2019 1309   BASOSABS 0.0 11/19/2019 1309      Chemistry      Component Value Date/Time   NA 135 11/19/2019 1309   K 4.0 11/19/2019 1309   CL 99 11/19/2019 1309   CO2 27 11/19/2019 1309   BUN 17 11/19/2019 1309   CREATININE 0.95 11/19/2019 1309      Component Value Date/Time   CALCIUM 9.0 11/19/2019 1309   ALKPHOS 107 11/19/2019 1309   AST 10 (L) 11/19/2019 1309   ALT 15 11/19/2019 1309   BILITOT 0.5 11/19/2019 1309       RADIOGRAPHIC STUDIES:  MR  CERVICAL SPINE WO CONTRAST  Result Date: 11/08/2019 [facility] NEUROIMAGING REPORT STUDY DATE: 11/07/19 PATIENT NAME: Erica Giles DOB: 1955/11/11 MRN: [id number] ORDERING CLINICIAN: Marcial Pacas, MD CLINICAL HISTORY: 64 year old female with neck pain. EXAM: MR CERVICAL SPINE WO CONTRAST TECHNIQUE: MRI of the cervical spine was obtained utilizing 3 mm sagittal slices from the posterior fossa down to the T3-4 level with T1, T2 and inversion recovery views. In addition 4 mm axial slices from M1-9 down to T1-2 level were included with T2 and gradient echo views. CONTRAST: none COMPARISON: none IMAGING SITE: [facility] [address] (1.5 Tesla MRI) FINDINGS: On sagittal views the vertebral bodies have normal height and alignment.  Mild disc bulging at C3-4.  The spinal cord is normal in size and appearance. The posterior fossa, pituitary gland and paraspinal soft tissues are unremarkable.  On axial views there is no spinal stenosis or foraminal narrowing. Limited views of the soft tissues of the head and neck are unremarkable.   Unremarkable MRI cervical spine (without). No spinal stenosis or foraminal narrowing. INTERPRETING PHYSICIAN: Penni Bombard, MD Certified in Neurology, Neurophysiology and Neuroimaging [facility] [address] [place], [place] [address] [phone]   NCV with EMG(electromyography)  Result Date: 10/31/2019 Marcial Pacas, MD     10/31/2019  9:51 AM     Full Name: Erica Giles Gender: Female MRN #: [id number] Date of Birth: 11/03/1955   Visit Date: 10/31/2019 08:03 Age: 69 Years Examining Physician: Marcial Pacas, MD Referring Physician: Marcial Pacas, MD Height: 5 feet 5 inch History:   64 years old female, with history of left humeral fracture, bilateral carpal tunnel release surgery, complains of neck pain, radiating pain to bilateral shoulder, intermittent bilateral hands paresthesia Summary of the test. Nerve conduction study: Bilateral  ulnar sensory and motor responses were normal. Bilateral median motor responses were normal. Right median sensory response showed mildly prolonged  peak latency, with normal snap amplitude. Left median sensory response was normal. Left median mixed response was 0.5 ms prolonged compared to ipsilateral ulnar mixed response. Right sural, superficial peroneal sensory responses were normal. Right tibial, superficial peroneal EDB motor responses were normal. Electromyography: Selected needle examination of bilateral upper extremity muscles and bilateral cervical paraspinal muscles were normal. Conclusion: This is a mild abnormal study. There is electrodiagnostic evidence of mild bilateral median neuropathy across the wrist, consistent with mild bilateral carpal tunnel syndromes. There is no evidence of bilateral cervical radiculopathy. There is no evidence of large fiber peripheral neuropathy. ------------------------------- Marcial Pacas M.D. PhD [facility] [address], [place] [address] Tel: [phone] Fax: [phone] Verbal informed consent was obtained from the patient, patient was informed of potential risk of procedure, including bruising, bleeding, hematoma formation, infection, muscle weakness, muscle pain, numbness, among others.     [place]   Nerve / Sites Muscle Latency Ref. Amplitude Ref. Rel Amp Segments Distance Velocity Ref. Area   ms ms mV mV %  cm m/s m/s mVms R Median - APB    Wrist APB 4.0 ?4.4 5.0 ?4.0 100 Wrist - APB 7   13.6    Upper arm APB 8.1  4.6  93.4 Upper arm - Wrist 22 53 ?49 14.2 L Median - APB    Wrist APB 3.5 ?4.4 5.5 ?4.0 100 Wrist - APB 7   16.8    Upper arm APB 7.4  5.2  93.5 Upper arm - Wrist 21 53 ?49 14.5 R Ulnar - ADM    Wrist ADM 2.6 ?3.3 6.0 ?6.0 100 Wrist - ADM 7   20.0    B.Elbow ADM 6.0  6.6  111 B.Elbow - Wrist 21 60 ?49 22.3    A.Elbow ADM 7.4  6.2  93.7 A.Elbow - B.Elbow 10 75 ?49 22.6        A.Elbow - Wrist     L Ulnar - ADM    Wrist ADM 2.5 ?3.3 9.4  ?6.0 100 Wrist - ADM 7   28.7    B.Elbow ADM 5.5  8.6  91.9 B.Elbow - Wrist 20 66 ?49 29.0    A.Elbow ADM 7.0  8.6  100 A.Elbow - B.Elbow 10 67 ?49 29.3        A.Elbow - Wrist     R Peroneal - EDB    Ankle EDB 4.6 ?6.5 3.7 ?2.0 100 Ankle - EDB 9   9.8    Fib head EDB 11.2  3.1  84.2 Fib head - Ankle 29 44 ?44 9.2    Pop fossa EDB 13.5  3.0  95 Pop fossa - Fib head 10 44 ?44 8.8        Pop fossa - Ankle     R Tibial - AH    Ankle AH 4.0 ?5.8 4.5 ?4.0 100 Ankle - AH 9   15.1    Pop fossa AH 12.9  2.7  59.9 Pop fossa - Ankle 37 41 ?41 9.4               SNC   Nerve / Sites Rec. Site Peak Lat Ref.  Amp Ref. Segments Distance Peak Diff Ref.   ms ms V V  cm ms ms R Sural - Ankle (Calf)    Calf Ankle 4.4 ?4.4 8 ?6 Calf - Ankle 14   R Superficial peroneal - Ankle    Lat leg Ankle 4.3 ?4.4 6 ?6 Lat leg - Ankle 14  L Median, Ulnar - Transcarpal comparison    Median Palm Wrist 2.2 ?2.2 74 ?35 Median Palm - Wrist 8      Ulnar Palm Wrist 1.7 ?2.2 30 ?12 Ulnar Palm - Wrist 8         Median Palm - Ulnar Palm  0.5 ?0.4 R Median - Orthodromic (Dig II, Mid palm)    Dig II Wrist 3.7 ?3.4 5 ?10 Dig II - Wrist 13   L Median - Orthodromic (Dig II, Mid palm)    Dig II Wrist 3.1 ?3.4 11 ?10 Dig II - Wrist 13   R Ulnar - Orthodromic, (Dig V, Mid palm)    Dig V Wrist 2.4 ?3.1 12 ?5 Dig V - Wrist 11   L Ulnar - Orthodromic, (Dig V, Mid palm)    Dig V Wrist 2.4 ?3.1 10 ?5 Dig V - Wrist 62                   F  Wave   Nerve F Lat Ref.  ms ms R Tibial - AH 52.1 ?56.0 R Ulnar - ADM 26.9 ?32.0 L Ulnar - ADM 26.5 ?32.0         EMG Summary Table   Spontaneous MUAP Recruitment Muscle IA Fib PSW Fasc Other Amp Dur. Poly Pattern L. First dorsal interosseous Normal None None None _______ Normal Normal Normal Normal L. Pronator teres Normal None None None _______ Normal Normal Normal Normal L. Brachioradialis Normal None None None _______ Normal Normal Normal Normal L. Deltoid Normal None None None _______ Normal Normal Normal Normal L. Biceps brachii  Normal None None None _______ Normal Normal Normal Normal R. First dorsal interosseous Normal None None None _______ Normal Normal Normal Normal R. Pronator teres Normal None None None _______ Normal Normal Normal Normal R. Biceps brachii Normal None None None _______ Normal Normal Normal Normal R. Deltoid Normal None None None _______ Normal Normal Normal Normal R. Triceps brachii Normal None None None _______ Normal Normal Normal Normal L. Triceps brachii Normal None None None _______ Normal Normal Normal Normal R. Cervical paraspinals Normal None None None _______ Normal Normal Normal Normal      PATHOLOGY:    ASSESSMENT AND PLAN:  1. Malignant neoplasm of upper-outer quadrant of left breast in female, estrogen receptor positive ([place]) Stage Ib (T1CN1) left breast IDC: -She is continuing anastrozole without any major problems. -XRT completed on 07/18/2019.  Laboratory work completed on 11/19/2019 reveals a minimal anemia at 11.3 g/dL that is normocytic and normochromic.  White blood cell count and platelet count within normal limits.  Metabolic panel unimpressive.  Vitamin D within normal limits at 30.  Over the last 5-6 years, she has intermittently anemic.  I will check iron studies, B12, MMA, and folate given her upper limits of normal MCV.  She will continue anastrozole therapy  Continue vitamin D replacement therapy.  She will be due for her first posttreatment diagnostic mammogram in 02/2020 and orders placed accordingly.  Bone density examination in 04/2019 was normal.  Next bone density examination will be due in 04/2021.  Return in 4 months for follow-up.  2. Genetic testing Negative genetic testing  3. DM type 2 with diabetic peripheral neuropathy (HCC) On Cymbalta therapy   ORDERS PLACED FOR THIS ENCOUNTER: Orders Placed This Encounter  Procedures  . MM DIAG BREAST TOMO BILATERAL  . CBC with Differential  . Comprehensive metabolic panel  . Iron and TIBC  . Ferritin   . Vitamin  B12  . Methylmalonic acid, serum  . Folate  . Vitamin D 25 hydroxy    MEDICATIONS PRESCRIBED THIS ENCOUNTER: No orders of the defined types were placed in this encounter.  All questions were answered. The patient knows to call the clinic with any problems, questions or concerns. We can certainly see the patient much sooner if necessary.  Patient and plan discussed with Dr. Derek Jack and he is in agreement with the aforementioned.   This note is electronically signed by: Robynn Pane, PA-C 11/27/2019 3:40 PM

## 2019-11-27 NOTE — Patient Instructions (Signed)
[place] at [facility] Discharge Instructions  You were seen today by Kirby Crigler PA. He went over your recent lab results. Continue taking the anastrozole and vitamin D daily. He will schedule you for your mammogram in September.  He will see you back in 4 months for labs and follow up.   Thank you for choosing [place] at [facility] to provide your oncology and hematology care.  To afford each patient quality time with our provider, please arrive at least 15 minutes before your scheduled appointment time.   If you have a lab appointment with the [facility] please come in thru the  Main Entrance and check in at the main information desk  You need to re-schedule your appointment should you arrive 10 or more minutes late.  We strive to give you quality time with our providers, and arriving late affects you and other patients whose appointments are after yours.  Also, if you no show three or more times for appointments you may be dismissed from the clinic at the providers discretion.     Again, thank you for choosing [facility].  Our hope is that these requests will decrease the amount of time that you wait before being seen by our physicians.       _____________________________________________________________  Should you have questions after your visit to [facility], please contact our office at [phone] between the hours of 8:00 a.m. and 4:30 p.m.  Voicemails left after 4:00 p.m. will not be returned until the following business day.  For prescription refill requests, have your pharmacy contact our office and allow 72 hours.    Cancer Center Support Programs:   > Cancer Support Group  2nd Tuesday of the month 1pm-2pm, Journey Room

## 2019-12-04 DIAGNOSIS — S52562D Barton's fracture of left radius, subsequent encounter for closed fracture with routine healing: Secondary | ICD-10-CM | POA: Diagnosis not present

## 2019-12-04 DIAGNOSIS — M13841 Other specified arthritis, right hand: Secondary | ICD-10-CM | POA: Diagnosis not present

## 2019-12-07 ENCOUNTER — Other Ambulatory Visit (HOSPITAL_COMMUNITY): Payer: Self-pay | Admitting: Hematology

## 2019-12-07 DIAGNOSIS — G4733 Obstructive sleep apnea (adult) (pediatric): Secondary | ICD-10-CM | POA: Diagnosis not present

## 2019-12-07 DIAGNOSIS — C50412 Malignant neoplasm of upper-outer quadrant of left female breast: Secondary | ICD-10-CM

## 2019-12-07 DIAGNOSIS — Z170 Estrogen receptor positive status [ER+]: Secondary | ICD-10-CM

## 2019-12-18 DIAGNOSIS — S52562D Barton's fracture of left radius, subsequent encounter for closed fracture with routine healing: Secondary | ICD-10-CM | POA: Diagnosis not present

## 2019-12-18 DIAGNOSIS — M25632 Stiffness of left wrist, not elsewhere classified: Secondary | ICD-10-CM | POA: Diagnosis not present

## 2019-12-26 DIAGNOSIS — Z170 Estrogen receptor positive status [ER+]: Secondary | ICD-10-CM | POA: Diagnosis not present

## 2019-12-26 DIAGNOSIS — C50412 Malignant neoplasm of upper-outer quadrant of left female breast: Secondary | ICD-10-CM | POA: Diagnosis not present

## 2020-01-01 DIAGNOSIS — Z20828 Contact with and (suspected) exposure to other viral communicable diseases: Secondary | ICD-10-CM | POA: Diagnosis not present

## 2020-01-01 DIAGNOSIS — J0100 Acute maxillary sinusitis, unspecified: Secondary | ICD-10-CM | POA: Diagnosis not present

## 2020-01-15 DIAGNOSIS — S52562D Barton's fracture of left radius, subsequent encounter for closed fracture with routine healing: Secondary | ICD-10-CM | POA: Diagnosis not present

## 2020-01-30 ENCOUNTER — Other Ambulatory Visit (HOSPITAL_COMMUNITY): Payer: Self-pay | Admitting: Hematology

## 2020-01-30 DIAGNOSIS — Z9889 Other specified postprocedural states: Secondary | ICD-10-CM

## 2020-02-07 DIAGNOSIS — G4733 Obstructive sleep apnea (adult) (pediatric): Secondary | ICD-10-CM | POA: Diagnosis not present

## 2020-02-19 ENCOUNTER — Ambulatory Visit (HOSPITAL_COMMUNITY)
Admission: RE | Admit: 2020-02-19 | Discharge: 2020-02-19 | Disposition: A | Discharge status: Home / Self Care | Payer: BC Managed Care – PPO | Source: Ambulatory Visit | Attending: Hematology | Admitting: Hematology

## 2020-02-19 ENCOUNTER — Encounter (HOSPITAL_COMMUNITY): Payer: Self-pay

## 2020-02-19 ENCOUNTER — Other Ambulatory Visit: Payer: Self-pay

## 2020-02-19 DIAGNOSIS — Z9889 Other specified postprocedural states: Secondary | ICD-10-CM

## 2020-02-19 DIAGNOSIS — C50412 Malignant neoplasm of upper-outer quadrant of left female breast: Secondary | ICD-10-CM

## 2020-02-19 DIAGNOSIS — Z170 Estrogen receptor positive status [ER+]: Secondary | ICD-10-CM | POA: Diagnosis not present

## 2020-02-19 DIAGNOSIS — R922 Inconclusive mammogram: Secondary | ICD-10-CM | POA: Diagnosis not present

## 2020-02-19 HISTORY — DX: Personal history of irradiation: Z92.3

## 2020-03-08 DIAGNOSIS — G4733 Obstructive sleep apnea (adult) (pediatric): Secondary | ICD-10-CM | POA: Diagnosis not present

## 2020-03-18 DIAGNOSIS — Z23 Encounter for immunization: Secondary | ICD-10-CM | POA: Diagnosis not present

## 2020-03-19 ENCOUNTER — Encounter: Payer: PRIVATE HEALTH INSURANCE | Attending: Family Medicine | Primary: Family Medicine

## 2020-03-21 ENCOUNTER — Other Ambulatory Visit (HOSPITAL_COMMUNITY): Payer: Self-pay | Admitting: *Deleted

## 2020-03-21 DIAGNOSIS — C50412 Malignant neoplasm of upper-outer quadrant of left female breast: Secondary | ICD-10-CM

## 2020-03-21 DIAGNOSIS — Z170 Estrogen receptor positive status [ER+]: Secondary | ICD-10-CM

## 2020-03-21 MED ORDER — ANASTROZOLE 1 MG PO TABS: 1.0000 mg | ORAL_TABLET | Freq: Every day | ORAL | 3 refills | Status: DC

## 2020-03-25 ENCOUNTER — Other Ambulatory Visit: Payer: Self-pay

## 2020-03-25 ENCOUNTER — Inpatient Hospital Stay (HOSPITAL_COMMUNITY): Payer: BC Managed Care – PPO | Attending: Hematology

## 2020-03-25 DIAGNOSIS — Z803 Family history of malignant neoplasm of breast: Secondary | ICD-10-CM | POA: Diagnosis not present

## 2020-03-25 DIAGNOSIS — N644 Mastodynia: Secondary | ICD-10-CM | POA: Insufficient documentation

## 2020-03-25 DIAGNOSIS — Z170 Estrogen receptor positive status [ER+]: Secondary | ICD-10-CM | POA: Insufficient documentation

## 2020-03-25 DIAGNOSIS — R200 Anesthesia of skin: Secondary | ICD-10-CM | POA: Insufficient documentation

## 2020-03-25 DIAGNOSIS — E559 Vitamin D deficiency, unspecified: Secondary | ICD-10-CM | POA: Diagnosis not present

## 2020-03-25 DIAGNOSIS — Z79811 Long term (current) use of aromatase inhibitors: Secondary | ICD-10-CM | POA: Insufficient documentation

## 2020-03-25 DIAGNOSIS — C50412 Malignant neoplasm of upper-outer quadrant of left female breast: Secondary | ICD-10-CM

## 2020-03-25 DIAGNOSIS — D649 Anemia, unspecified: Secondary | ICD-10-CM

## 2020-03-25 LAB — IRON AND TIBC
Iron: 66 ug/dL (ref 28–170)
Saturation Ratios: 19 % (ref 10.4–31.8)
TIBC: 347 ug/dL (ref 250–450)
UIBC: 281 ug/dL

## 2020-03-25 LAB — CBC WITH DIFFERENTIAL/PLATELET
Abs Immature Granulocytes: 0.04 10*3/uL (ref 0.00–0.07)
Basophils Absolute: 0 10*3/uL (ref 0.0–0.1)
Basophils Relative: 1 %
Eosinophils Absolute: 0.1 10*3/uL (ref 0.0–0.5)
Eosinophils Relative: 1 %
HCT: 37.2 % (ref 36.0–46.0)
Hemoglobin: 12.2 g/dL (ref 12.0–15.0)
Immature Granulocytes: 1 %
Lymphocytes Relative: 27 %
Lymphs Abs: 1.5 10*3/uL (ref 0.7–4.0)
MCH: 31 pg (ref 26.0–34.0)
MCHC: 32.8 g/dL (ref 30.0–36.0)
MCV: 94.4 fL (ref 80.0–100.0)
Monocytes Absolute: 0.5 10*3/uL (ref 0.1–1.0)
Monocytes Relative: 8 %
Neutro Abs: 3.5 10*3/uL (ref 1.7–7.7)
Neutrophils Relative %: 62 %
Platelets: 232 10*3/uL (ref 150–400)
RBC: 3.94 MIL/uL (ref 3.87–5.11)
RDW: 13.3 % (ref 11.5–15.5)
WBC: 5.5 10*3/uL (ref 4.0–10.5)
nRBC: 0 % (ref 0.0–0.2)

## 2020-03-25 LAB — COMPREHENSIVE METABOLIC PANEL
ALT: 18 U/L (ref 0–44)
AST: 16 U/L (ref 15–41)
Albumin: 3.6 g/dL (ref 3.5–5.0)
Alkaline Phosphatase: 113 U/L (ref 38–126)
Anion gap: 11 (ref 5–15)
BUN: 20 mg/dL (ref 8–23)
CO2: 25 mmol/L (ref 22–32)
Calcium: 9.3 mg/dL (ref 8.9–10.3)
Chloride: 101 mmol/L (ref 98–111)
Creatinine, Ser: 0.98 mg/dL (ref 0.44–1.00)
GFR, Estimated: >60 mL/min (ref >60)
Glucose, Bld: 276 mg/dL — ABNORMAL HIGH (ref 70–99)
Potassium: 4.2 mmol/L (ref 3.5–5.1)
Sodium: 137 mmol/L (ref 135–145)
Total Bilirubin: 0.7 mg/dL (ref 0.3–1.2)
Total Protein: 6.7 g/dL (ref 6.5–8.1)

## 2020-03-25 LAB — VITAMIN D 25 HYDROXY (VIT D DEFICIENCY, FRACTURES): Vit D, 25-Hydroxy: 21.54 ng/mL — ABNORMAL LOW (ref 30–100)

## 2020-03-25 LAB — FERRITIN: Ferritin: 29 ng/mL (ref 11–307)

## 2020-03-25 LAB — FOLATE: Folate: 18.8 ng/mL (ref >5.9)

## 2020-03-25 LAB — VITAMIN B12: Vitamin B-12: 447 pg/mL (ref 180–914)

## 2020-03-26 DIAGNOSIS — M7672 Peroneal tendinitis, left leg: Secondary | ICD-10-CM | POA: Diagnosis not present

## 2020-03-26 DIAGNOSIS — M79672 Pain in left foot: Secondary | ICD-10-CM | POA: Diagnosis not present

## 2020-03-28 LAB — METHYLMALONIC ACID, SERUM: Methylmalonic Acid, Quantitative: 247 nmol/L (ref 0–378)

## 2020-04-01 ENCOUNTER — Inpatient Hospital Stay (HOSPITAL_BASED_OUTPATIENT_CLINIC_OR_DEPARTMENT_OTHER): Payer: BC Managed Care – PPO | Admitting: Hematology

## 2020-04-01 ENCOUNTER — Other Ambulatory Visit: Payer: Self-pay

## 2020-04-01 VITALS — BP 139/56 | HR 82 | Temp 97.0°F | Resp 18 | Wt 217.2 lb

## 2020-04-01 DIAGNOSIS — C50412 Malignant neoplasm of upper-outer quadrant of left female breast: Secondary | ICD-10-CM

## 2020-04-01 DIAGNOSIS — Z79811 Long term (current) use of aromatase inhibitors: Secondary | ICD-10-CM | POA: Diagnosis not present

## 2020-04-01 DIAGNOSIS — Z170 Estrogen receptor positive status [ER+]: Secondary | ICD-10-CM | POA: Diagnosis not present

## 2020-04-01 DIAGNOSIS — R200 Anesthesia of skin: Secondary | ICD-10-CM | POA: Diagnosis not present

## 2020-04-01 DIAGNOSIS — Z803 Family history of malignant neoplasm of breast: Secondary | ICD-10-CM | POA: Diagnosis not present

## 2020-04-01 DIAGNOSIS — E559 Vitamin D deficiency, unspecified: Secondary | ICD-10-CM | POA: Diagnosis not present

## 2020-04-01 DIAGNOSIS — N644 Mastodynia: Secondary | ICD-10-CM | POA: Diagnosis not present

## 2020-04-01 NOTE — Progress Notes (Signed)
[facility] [address] [place] [address]   Patient Care Team: Rory Percy, MD as PCP - General (Family Medicine)  SUMMARY OF ONCOLOGIC HISTORY: Oncology History  Breast cancer of upper-outer quadrant of left female breast ([place])  03/06/2019 Initial Diagnosis   Breast cancer of upper-outer quadrant of left female breast ([place])   04/24/2019 Cancer Staging   Staging form: Breast, AJCC 8th Edition - Clinical: Stage IB (cT1c, cN1, cM0, G2, ER+, PR+, HER2-) - Signed by Derek Jack, MD on 04/24/2019   07/11/2019 Genetic Testing   Negative genetic testing on the common hereditary cancer panel.  The Common Hereditary Gene Panel offered by [facility] includes sequencing and/or deletion duplication testing of the following 48 genes: APC, ATM, AXIN2, BARD1, BMPR1A, BRCA1, BRCA2, BRIP1, CDH1, CDK4, CDKN2A (p14ARF), CDKN2A (p16INK4a), CHEK2, CTNNA1, DICER1, EPCAM (Deletion/duplication testing only), GREM1 (promoter region deletion/duplication testing only), KIT, MEN1, MLH1, MSH2, MSH3, MSH6, MUTYH, NBN, NF1, NHTL1, PALB2, PDGFRA, PMS2, POLD1, POLE, PTEN, RAD50, RAD51C, RAD51D, RNF43, SDHB, SDHC, SDHD, SMAD4, SMARCA4. STK11, TP53, TSC1, TSC2, and VHL.  The following genes were evaluated for sequence changes only: SDHA and HOXB13 c.251G>A variant only. The report date is July 11, 2019.     CHIEF COMPLIANT: Follow up for left breast cancer   INTERVAL HISTORY: [name] is a 64 y.o. female here today for follow up of her left breast cancer. She was last contacted via telephone on 07/25/2019.   Today she reports feeling well. She had surgery for carpal tunnel, but then fell and reports having numbness in her hands. She reports taking vitamin B12 and D. She is tolerating the Arimidex well.   REVIEW OF SYSTEMS:   Review of Systems  Constitutional: Negative for appetite change and fatigue.  Cardiovascular: Positive for chest pain (3/10 L breast pain).  Neurological:  Positive for numbness (hands d/t carpal tunnel).  All other systems reviewed and are negative.   I have reviewed the past medical history, past surgical history, social history and family history with the patient and they are unchanged from previous note.   ALLERGIES:   is allergic to percocet [oxycodone-acetaminophen], hydrocodone, and oxycodone-acetaminophen.   MEDICATIONS:  Current Outpatient Medications  Medication Sig Dispense Refill  . anastrozole (ARIMIDEX) 1 MG tablet Take 1 tablet (1 mg total) by mouth daily. 30 tablet 3  . atorvastatin (LIPITOR) 40 MG tablet Take 40 mg by mouth at bedtime.     . cholecalciferol (VITAMIN D3) 25 MCG (1000 UT) tablet Take 2,000-4,000 Units by mouth See admin instructions. 2000 mcg daily, except once a week, take 4000 mcg daily    . cyanocobalamin (,VITAMIN B-12,) 1000 MCG/ML injection Inject 1,000 mcg into the muscle every 30 (thirty) days.     . DULoxetine (CYMBALTA) 60 MG capsule Take 60 mg by mouth daily.    . Empagliflozin-metFORMIN HCl ER (SYNJARDY XR) 12.10-998 MG TB24 Take 2 tablets by mouth every morning.     . gabapentin (NEURONTIN) 300 MG capsule Take 900 mg by mouth 3 (three) times daily as needed (pain).  (Patient not taking: Reported on 11/27/2019)    . ibuprofen (ADVIL) 800 MG tablet as needed.  (Patient not taking: Reported on 11/27/2019)    . insulin aspart protamine- aspart (NOVOLOG MIX 70/30) (70-30) 100 UNIT/ML injection Inject 50 Units into the skin 2 (two) times daily with a meal.     . lisinopril (PRINIVIL,ZESTRIL) 20 MG tablet Take 20 mg by mouth at bedtime.    Marland Kitchen  oxyCODONE-acetaminophen (PERCOCET/ROXICET) 5-325 MG tablet oxycodone-acetaminophen 5 mg-325 mg tablet  Take 1 tablet every 6 hours by oral route for 5 days. (Patient not taking: Reported on 11/27/2019)    . pantoprazole (PROTONIX) 40 MG tablet Take 1 tablet (40 mg total) by mouth 2 (two) times daily. (Patient taking differently: Take 40 mg by mouth daily. ) 60 tablet 11    . SYRINGE-NEEDLE, DISP, 3 ML (B-D 3CC LUER-LOK SYR 25GX5/8") 25G X 5/8" 3 ML MISC BD Luer-Lok Syringe 3 mL 25 x 5/8"  USE TO INJECT VITAMIN B12 ONCE A MONTH.    . vitamin B-12 (CYANOCOBALAMIN) 1000 MCG tablet Take 1,000 mcg by mouth daily.     No current facility-administered medications for this visit.     PHYSICAL EXAMINATION: Performance status (ECOG): 1 - Symptomatic but completely ambulatory  Vitals:   04/01/20 1432  BP: (!) 139/56  Pulse: 82  Resp: 18  Temp: (!) 97 F (36.1 C)  SpO2: 97%   Wt Readings from Last 3 Encounters:  04/01/20 217 lb 3.2 oz (98.5 kg)  11/27/19 217 lb 6.4 oz (98.6 kg)  10/25/19 218 lb 8 oz (99.1 kg)   Physical Exam Vitals reviewed.  Constitutional:      Appearance: Normal appearance. She is obese.  Cardiovascular:     Rate and Rhythm: Normal rate and regular rhythm.     Pulses: Normal pulses.     Heart sounds: Normal heart sounds.  Pulmonary:     Effort: Pulmonary effort is normal.     Breath sounds: Normal breath sounds.  Chest:     Breasts:        Left: Skin change (overall hyperpigmentation) present. No mass, nipple discharge or tenderness.    Musculoskeletal:     Right lower leg: No edema.     Left lower leg: No edema.  Lymphadenopathy:     Cervical: No cervical adenopathy.     Upper Body:     Right upper body: No supraclavicular, axillary or pectoral adenopathy.     Left upper body: No supraclavicular, axillary or pectoral adenopathy.  Neurological:     General: No focal deficit present.     Mental Status: She is alert and oriented to person, place, and time.  Psychiatric:        Mood and Affect: Mood normal.        Behavior: Behavior normal.     Breast Exam Chaperone: Milinda Antis, MD     LABORATORY DATA:  I have reviewed the data as listed CMP Latest Ref Rng & Units 03/25/2020 11/19/2019 07/18/2019  Glucose 70 - 99 mg/dL 276(H) 260(H) 214(H)  BUN 8 - 23 mg/dL _0 Creatinine 0.44 - 1.00 mg/dL 0.98 0.95  1.01(H)  Sodium 135 - 145 mmol/L 137 135 135  Potassium 3.5 - 5.1 mmol/L 4.2 4.0 3.9  Chloride 98 - 111 mmol/L 101 99 101  CO2 22 - 32 mmol/L _1 Calcium 8.9 - 10.3 mg/dL 9.3 9.0 8.8(L)  Total Protein 6.5 - 8.1 g/dL 6.7 7.0 7.0  Total Bilirubin 0.3 - 1.2 mg/dL 0.7 0.5 0.6  Alkaline Phos 38 - 126 U/L 113 107 130(H)  AST 15 - 41 U/L 16 10(L) 16  ALT 0 - 44 U/L _2 No results found for: XTA569 Lab Results  Component Value Date   WBC 5.5 03/25/2020   HGB 12.2 03/25/2020   HCT 37.2 03/25/2020   MCV 94.4 03/25/2020   PLT 232  03/25/2020   NEUTROABS 3.5 03/25/2020   Lab Results  Component Value Date   VD25OH 21.54 (L) 03/25/2020   VD25OH 30.09 11/19/2019   Lab Results  Component Value Date   TIBC 347 03/25/2020   FERRITIN 29 03/25/2020   FERRITIN 9.3 (L) 02/22/2014   IRONPCTSAT 19 03/25/2020   IRONPCTSAT 19.6 (L) 05/27/2014   IRONPCTSAT 12.9 (L) 02/22/2014    ASSESSMENT:  1.  Stage Ib (T1CN1) left breast IDC: -She is continuing anastrozole without any major problems. -XRT completed on 07/18/2019. -Mammogram on 02/19/2020, BI-RADS Category 2.  2.  Family history: -3 paternal aunts had breast cancer.  A maternal aunt had breast cancer. -[facility] germline mutation testing was negative.  3.  Bone health: -DEXA scan on 04/19/2019 shows T score of -1.   PLAN:  1.  Stage Ib (T1CN1) left breast IDC: -Physical examination today did not reveal any palpable masses. -Reviewed mammogram results from 02/19/2020, BI-RADS Category 2. -Reviewed labs from 03/25/2020. LFTs and CBC are normal. -RTC 6 months for follow-up.  2.  Bone health: -Vitamin D low at 21.54. Recommend vitamin D 2000 units daily. -Continue calcium twice daily.    Breast Cancer therapy associated bone loss: I have recommended calcium, Vitamin D and weight bearing exercises.   No orders of the defined types were placed in this encounter.  The patient has a good understanding of the overall plan.  she agrees with it. she will call with any problems that may develop before the next visit here.    Derek Jack, MD [place] [phone]   I, Milinda Antis, am acting as a scribe for Dr. Sanda Linger.  I, Derek Jack MD, have reviewed the above documentation for accuracy and completeness, and I agree with the above.

## 2020-04-01 NOTE — Patient Instructions (Signed)
[place] at [facility] Discharge Instructions  You were seen today by Dr. Delton Coombes. He went over your recent results and scans. Start taking 2,000 units of vitamin D daily. Your next appointment will be with the nurse practitioner in 6 months for labs and follow up.   Thank you for choosing [place] at [facility] to provide your oncology and hematology care.  To afford each patient quality time with our provider, please arrive at least 15 minutes before your scheduled appointment time.   If you have a lab appointment with the [facility] please come in thru the Main Entrance and check in at the main information desk  You need to re-schedule your appointment should you arrive 10 or more minutes late.  We strive to give you quality time with our providers, and arriving late affects you and other patients whose appointments are after yours.  Also, if you no show three or more times for appointments you may be dismissed from the clinic at the providers discretion.     Again, thank you for choosing [facility].  Our hope is that these requests will decrease the amount of time that you wait before being seen by our physicians.       _____________________________________________________________  Should you have questions after your visit to [facility], please contact our office at [phone] between the hours of 8:00 a.m. and 4:30 p.m.  Voicemails left after 4:00 p.m. will not be returned until the following business day.  For prescription refill requests, have your pharmacy contact our office and allow 72 hours.    Cancer Center Support Programs:   > Cancer Support Group  2nd Tuesday of the month 1pm-2pm, Journey Room

## 2020-04-02 ENCOUNTER — Other Ambulatory Visit (HOSPITAL_COMMUNITY): Payer: Self-pay | Admitting: *Deleted

## 2020-04-02 DIAGNOSIS — C50412 Malignant neoplasm of upper-outer quadrant of left female breast: Secondary | ICD-10-CM

## 2020-04-03 DIAGNOSIS — G4733 Obstructive sleep apnea (adult) (pediatric): Secondary | ICD-10-CM | POA: Diagnosis not present

## 2020-04-04 ENCOUNTER — Encounter

## 2020-04-04 ENCOUNTER — Inpatient Hospital Stay: Payer: PRIVATE HEALTH INSURANCE | Primary: Family Medicine

## 2020-04-04 ENCOUNTER — Inpatient Hospital Stay: Admit: 2020-04-04 | Payer: PRIVATE HEALTH INSURANCE | Primary: Family Medicine

## 2020-04-04 DIAGNOSIS — Z1239 Encounter for other screening for malignant neoplasm of breast: Secondary | ICD-10-CM

## 2020-04-04 DIAGNOSIS — R52 Pain, unspecified: Secondary | ICD-10-CM

## 2020-04-07 ENCOUNTER — Telehealth: Payer: Self-pay | Admitting: Neurology

## 2020-04-07 NOTE — Telephone Encounter (Signed)
NCV/EMG Conclusion: This is a mild abnormal study. There is electrodiagnostic evidence of mild bilateral median neuropathy across the wrist, consistent with mild bilateral carpal tunnel syndromes. There is no evidence of bilateral cervical radiculopathy. There is no evidence of large fiber peripheral neuropathy.   IMPRESSION:  Unremarkable MRI cervical spine (without). No spinal stenosis or foraminal narrowing.   I returned the call to the patient. Her findings did not support the need for a neurosurgical consultation. She is still having issues with her bilateral hands. She is calling her hand specialist, Dr. Caralyn Guile at [facility], for re-evaluation.

## 2020-04-07 NOTE — Telephone Encounter (Signed)
Pt states she needs a referral to see Dr Erline Levine, please reply.

## 2020-04-08 DIAGNOSIS — G4733 Obstructive sleep apnea (adult) (pediatric): Secondary | ICD-10-CM | POA: Diagnosis not present

## 2020-04-15 ENCOUNTER — Other Ambulatory Visit (HOSPITAL_COMMUNITY): Payer: Self-pay

## 2020-04-15 DIAGNOSIS — C50412 Malignant neoplasm of upper-outer quadrant of left female breast: Secondary | ICD-10-CM

## 2020-04-15 DIAGNOSIS — Z170 Estrogen receptor positive status [ER+]: Secondary | ICD-10-CM

## 2020-04-15 MED ORDER — ANASTROZOLE 1 MG PO TABS: 1.0000 mg | ORAL_TABLET | Freq: Every day | ORAL | 3 refills | Status: DC

## 2020-04-15 NOTE — Telephone Encounter (Signed)
Per notes patient continues on Anastrozole.  Tolerating well.  Refill request processed on today.

## 2020-04-16 DIAGNOSIS — M7672 Peroneal tendinitis, left leg: Secondary | ICD-10-CM | POA: Diagnosis not present

## 2020-04-16 DIAGNOSIS — R2689 Other abnormalities of gait and mobility: Secondary | ICD-10-CM | POA: Diagnosis not present

## 2020-04-16 DIAGNOSIS — M25572 Pain in left ankle and joints of left foot: Secondary | ICD-10-CM | POA: Diagnosis not present

## 2020-04-16 DIAGNOSIS — R531 Weakness: Secondary | ICD-10-CM | POA: Diagnosis not present

## 2020-04-18 DIAGNOSIS — M7672 Peroneal tendinitis, left leg: Secondary | ICD-10-CM | POA: Diagnosis not present

## 2020-04-18 DIAGNOSIS — R2689 Other abnormalities of gait and mobility: Secondary | ICD-10-CM | POA: Diagnosis not present

## 2020-04-18 DIAGNOSIS — M25572 Pain in left ankle and joints of left foot: Secondary | ICD-10-CM | POA: Diagnosis not present

## 2020-04-18 DIAGNOSIS — R531 Weakness: Secondary | ICD-10-CM | POA: Diagnosis not present

## 2020-04-22 DIAGNOSIS — M7672 Peroneal tendinitis, left leg: Secondary | ICD-10-CM | POA: Diagnosis not present

## 2020-04-22 DIAGNOSIS — S52562D Barton's fracture of left radius, subsequent encounter for closed fracture with routine healing: Secondary | ICD-10-CM | POA: Diagnosis not present

## 2020-04-22 DIAGNOSIS — G5602 Carpal tunnel syndrome, left upper limb: Secondary | ICD-10-CM | POA: Diagnosis not present

## 2020-04-22 DIAGNOSIS — M25572 Pain in left ankle and joints of left foot: Secondary | ICD-10-CM | POA: Diagnosis not present

## 2020-04-22 DIAGNOSIS — R531 Weakness: Secondary | ICD-10-CM | POA: Diagnosis not present

## 2020-04-22 DIAGNOSIS — R2689 Other abnormalities of gait and mobility: Secondary | ICD-10-CM | POA: Diagnosis not present

## 2020-05-08 DIAGNOSIS — Z170 Estrogen receptor positive status [ER+]: Secondary | ICD-10-CM | POA: Diagnosis not present

## 2020-05-08 DIAGNOSIS — C50412 Malignant neoplasm of upper-outer quadrant of left female breast: Secondary | ICD-10-CM | POA: Diagnosis not present

## 2020-05-22 DIAGNOSIS — M13841 Other specified arthritis, right hand: Secondary | ICD-10-CM | POA: Diagnosis not present

## 2020-05-29 DIAGNOSIS — G5602 Carpal tunnel syndrome, left upper limb: Secondary | ICD-10-CM | POA: Diagnosis not present

## 2020-05-29 DIAGNOSIS — M5412 Radiculopathy, cervical region: Secondary | ICD-10-CM | POA: Diagnosis not present

## 2020-05-29 DIAGNOSIS — R200 Anesthesia of skin: Secondary | ICD-10-CM | POA: Diagnosis not present

## 2020-05-29 DIAGNOSIS — E1140 Type 2 diabetes mellitus with diabetic neuropathy, unspecified: Secondary | ICD-10-CM | POA: Diagnosis not present

## 2020-06-03 DIAGNOSIS — M5412 Radiculopathy, cervical region: Secondary | ICD-10-CM | POA: Diagnosis not present

## 2020-07-28 ENCOUNTER — Other Ambulatory Visit (HOSPITAL_COMMUNITY): Payer: Self-pay | Admitting: Hematology

## 2020-07-28 DIAGNOSIS — C50412 Malignant neoplasm of upper-outer quadrant of left female breast: Secondary | ICD-10-CM

## 2020-07-28 DIAGNOSIS — Z170 Estrogen receptor positive status [ER+]: Secondary | ICD-10-CM

## 2020-08-13 ENCOUNTER — Ambulatory Visit
Admit: 2020-08-13 | Discharge: 2020-08-13 | Payer: PRIVATE HEALTH INSURANCE | Attending: Family | Primary: Family Medicine

## 2020-08-13 DIAGNOSIS — Z0000 Encounter for general adult medical examination without abnormal findings: Secondary | ICD-10-CM

## 2020-08-13 LAB — POCT URINALYSIS DIPSTICK W/O MICROSCOPE (AUTO)
Bilirubin, UA: NEGATIVE
Glucose, UA POC: NEGATIVE
Ketones, UA: NEGATIVE
Leukocytes, UA: NEGATIVE
Nitrite, UA: NEGATIVE
Protein, UA POC: NEGATIVE
Spec Grav, UA: 1.025
Urobilinogen, UA: 0.2
pH, UA: 5

## 2020-08-13 MED ORDER — LISINOPRIL-HYDROCHLOROTHIAZIDE 20-25 MG PO TABS
20-25 MG | ORAL_TABLET | Freq: Every day | ORAL | 3 refills | Status: AC
Start: 2020-08-13 — End: ?

## 2020-08-13 MED ORDER — SIMVASTATIN 20 MG PO TABS
20 MG | ORAL_TABLET | Freq: Every evening | ORAL | 3 refills | Status: AC
Start: 2020-08-13 — End: ?

## 2020-08-13 MED ORDER — GABAPENTIN 300 MG PO CAPS
300 MG | ORAL_CAPSULE | Freq: Every evening | ORAL | 1 refills | Status: AC
Start: 2020-08-13 — End: 2021-02-09

## 2020-08-13 NOTE — Progress Notes (Signed)
[facility]  [facility]  [address] [place] [address]  Dept: [phone]  Loc: [phone]    Hayley Stephens (DOB:  1956-05-19) is a 65 y.o. female,Established patient, here for evaluation of the following chief complaint(s):  Annual Exam      ASSESSMENT/PLAN:  1. Wellness examination  -     POCT Urinalysis No Micro (Auto)  2. Primary hypertension  -     lisinopril-hydroCHLOROthiazide (PRINZIDE;ZESTORETIC) 20-25 MG per tablet; Take 1 tablet by mouth daily, Disp-90 tablet, R-3Normal  3. Hypercholesterolemia  -     simvastatin (ZOCOR) 20 MG tablet; Take 1 tablet by mouth nightly, Disp-90 tablet, R-3Normal  4. Osteoarthritis of lumbar spine, unspecified spinal osteoarthritis complication status  -     gabapentin (NEURONTIN) 300 MG capsule; Take 1 capsule by mouth nightly for 180 days., Disp-90 capsule, R-1Normal  5. Chronic right shoulder pain  -Advised ibuprofen, ice, rest  -Discussed xrays if no improvement noted    Return in about 6 months (around 02/13/2021) for gabapentin follow up.    SUBJECTIVE/OBJECTIVE:  HPI Presents for annual physical.  Had health fair labs done in 03/2020.  C/o right shoulder pain for a few months. Denies injury. Worse at night. No analgesics or interventions tried. Denies paresthesia.     BP 136/76    Pulse 93    Resp 18    Ht 5' 1.5" (1.562 m)    Wt 168 lb (76.2 kg)    SpO2 98%    BMI 31.23 kg/m??     Last PAP: s/p hysterectomy  Hx abnormal PAP: no  Self-breast exams: yes  Last mammogram: 03/2020 , normal  Last colonoscopy: cologuard 07/2018, negative  Last eye exam: 2 years ago, normal, wears glasses  Exercise: none  Calcium/vitamin D supplement: none    Review of Systems   Constitutional: Negative.    HENT: Negative.    Respiratory: Negative.  Negative for shortness of breath.    Cardiovascular: Negative.  Negative for chest pain.   Gastrointestinal: Negative.  Negative for constipation, diarrhea, nausea and vomiting.   Genitourinary: Negative.     Musculoskeletal: Positive for arthralgias (right shoulder). Negative for joint swelling.   Skin: Negative.  Negative for rash.   Neurological: Negative.  Negative for dizziness, weakness and headaches.       Physical Exam  Vitals reviewed.   Constitutional:       General: She is not in acute distress.     Appearance: Normal appearance.   HENT:      Right Ear: Tympanic membrane normal.      Left Ear: Tympanic membrane normal.      Mouth/Throat:      Mouth: Mucous membranes are moist.      Pharynx: Oropharynx is clear.   Cardiovascular:      Rate and Rhythm: Normal rate and regular rhythm.      Heart sounds: No murmur heard.      Pulmonary:      Effort: Pulmonary effort is normal.      Breath sounds: Normal breath sounds.   Abdominal:      General: Bowel sounds are normal. There is no distension.      Palpations: Abdomen is soft. There is no mass.      Tenderness: There is no abdominal tenderness.   Musculoskeletal:      Right shoulder: No swelling or tenderness. Decreased range of motion (slight with backward extension). Normal strength.  Left shoulder: Normal strength.      Cervical back: Neck supple.   Lymphadenopathy:      Cervical: No cervical adenopathy.   Skin:     General: Skin is warm and dry.   Neurological:      General: No focal deficit present.      Mental Status: She is alert.           An electronic signature was used to authenticate this note.    --Belva Chimes, APRN - CNP

## 2020-09-30 ENCOUNTER — Other Ambulatory Visit: Payer: Self-pay

## 2020-09-30 ENCOUNTER — Inpatient Hospital Stay (HOSPITAL_COMMUNITY): Payer: BC Managed Care – PPO | Attending: Hematology

## 2020-09-30 DIAGNOSIS — E559 Vitamin D deficiency, unspecified: Secondary | ICD-10-CM | POA: Diagnosis not present

## 2020-09-30 DIAGNOSIS — Z170 Estrogen receptor positive status [ER+]: Secondary | ICD-10-CM | POA: Diagnosis not present

## 2020-09-30 DIAGNOSIS — C50412 Malignant neoplasm of upper-outer quadrant of left female breast: Secondary | ICD-10-CM

## 2020-09-30 LAB — CBC WITH DIFFERENTIAL/PLATELET
Abs Immature Granulocytes: 0.04 10*3/uL (ref 0.00–0.07)
Basophils Absolute: 0 10*3/uL (ref 0.0–0.1)
Basophils Relative: 1 %
Eosinophils Absolute: 0.1 10*3/uL (ref 0.0–0.5)
Eosinophils Relative: 1 %
HCT: 36.9 % (ref 36.0–46.0)
Hemoglobin: 12 g/dL (ref 12.0–15.0)
Immature Granulocytes: 1 %
Lymphocytes Relative: 26 %
Lymphs Abs: 1.6 10*3/uL (ref 0.7–4.0)
MCH: 30.8 pg (ref 26.0–34.0)
MCHC: 32.5 g/dL (ref 30.0–36.0)
MCV: 94.9 fL (ref 80.0–100.0)
Monocytes Absolute: 0.5 10*3/uL (ref 0.1–1.0)
Monocytes Relative: 8 %
Neutro Abs: 3.8 10*3/uL (ref 1.7–7.7)
Neutrophils Relative %: 63 %
Platelets: 227 10*3/uL (ref 150–400)
RBC: 3.89 MIL/uL (ref 3.87–5.11)
RDW: 14 % (ref 11.5–15.5)
WBC: 5.9 10*3/uL (ref 4.0–10.5)
nRBC: 0 % (ref 0.0–0.2)

## 2020-09-30 LAB — IRON AND TIBC
Iron: 69 ug/dL (ref 28–170)
Saturation Ratios: 18 % (ref 10.4–31.8)
TIBC: 383 ug/dL (ref 250–450)
UIBC: 314 ug/dL

## 2020-09-30 LAB — COMPREHENSIVE METABOLIC PANEL
ALT: 23 U/L (ref 0–44)
AST: 15 U/L (ref 15–41)
Albumin: 3.9 g/dL (ref 3.5–5.0)
Alkaline Phosphatase: 97 U/L (ref 38–126)
Anion gap: 10 (ref 5–15)
BUN: 25 mg/dL — ABNORMAL HIGH (ref 8–23)
CO2: 26 mmol/L (ref 22–32)
Calcium: 9.3 mg/dL (ref 8.9–10.3)
Chloride: 97 mmol/L — ABNORMAL LOW (ref 98–111)
Creatinine, Ser: 1.11 mg/dL — ABNORMAL HIGH (ref 0.44–1.00)
GFR, Estimated: 56 mL/min — ABNORMAL LOW (ref >60)
Glucose, Bld: 432 mg/dL — ABNORMAL HIGH (ref 70–99)
Potassium: 4.1 mmol/L (ref 3.5–5.1)
Sodium: 133 mmol/L — ABNORMAL LOW (ref 135–145)
Total Bilirubin: 0.5 mg/dL (ref 0.3–1.2)
Total Protein: 6.7 g/dL (ref 6.5–8.1)

## 2020-09-30 LAB — VITAMIN D 25 HYDROXY (VIT D DEFICIENCY, FRACTURES): Vit D, 25-Hydroxy: 36.95 ng/mL (ref 30–100)

## 2020-09-30 LAB — FERRITIN: Ferritin: 27 ng/mL (ref 11–307)

## 2020-10-07 ENCOUNTER — Ambulatory Visit (HOSPITAL_COMMUNITY): Payer: BC Managed Care – PPO | Admitting: Hematology

## 2020-10-08 ENCOUNTER — Telehealth

## 2020-10-08 NOTE — Telephone Encounter (Signed)
Patient called.  Requesting referral to Dr Rema Jasmine so can get an EMG on her right arm.  Has pain from top of her arm down to her elbow especially at night.  Sometimes goes all the way to her hand.  Discussed with Aundra Millet 08/13/20 at her wellness exam.  Does not want to do x-rays.    Ok to refer to Dr Rema Jasmine?

## 2020-10-08 NOTE — Telephone Encounter (Signed)
OK to set up referral.

## 2020-10-08 NOTE — Telephone Encounter (Signed)
June 21, 8:00 Dr. Rema Jasmine

## 2020-10-23 ENCOUNTER — Ambulatory Visit (HOSPITAL_COMMUNITY): Payer: BC Managed Care – PPO | Admitting: Physician Assistant

## 2020-11-05 ENCOUNTER — Other Ambulatory Visit: Payer: Self-pay

## 2020-11-05 ENCOUNTER — Inpatient Hospital Stay (HOSPITAL_COMMUNITY): Payer: BC Managed Care – PPO | Attending: Hematology | Admitting: Physician Assistant

## 2020-11-05 VITALS — BP 140/57 | HR 80 | Temp 96.8°F | Resp 18 | Wt 214.5 lb

## 2020-11-05 DIAGNOSIS — M8580 Other specified disorders of bone density and structure, unspecified site: Secondary | ICD-10-CM | POA: Insufficient documentation

## 2020-11-05 DIAGNOSIS — Z87891 Personal history of nicotine dependence: Secondary | ICD-10-CM | POA: Diagnosis not present

## 2020-11-05 DIAGNOSIS — C50412 Malignant neoplasm of upper-outer quadrant of left female breast: Secondary | ICD-10-CM | POA: Insufficient documentation

## 2020-11-05 DIAGNOSIS — E611 Iron deficiency: Secondary | ICD-10-CM | POA: Insufficient documentation

## 2020-11-05 DIAGNOSIS — G629 Polyneuropathy, unspecified: Secondary | ICD-10-CM | POA: Diagnosis not present

## 2020-11-05 DIAGNOSIS — Z923 Personal history of irradiation: Secondary | ICD-10-CM | POA: Diagnosis not present

## 2020-11-05 DIAGNOSIS — M8589 Other specified disorders of bone density and structure, multiple sites: Secondary | ICD-10-CM | POA: Diagnosis not present

## 2020-11-05 DIAGNOSIS — Z170 Estrogen receptor positive status [ER+]: Secondary | ICD-10-CM | POA: Diagnosis not present

## 2020-11-05 NOTE — Progress Notes (Signed)
[place], [place] [address]   CLINIC:  Medical Oncology/Hematology  PCP:  Rory Percy, MD [address] [place] [address] [phone]   REASON FOR VISIT:  Follow-up for left breast cancer (stage Ib)  PRIOR THERAPY: Lumpectomy (03/17/2019) and radiation therapy (finished 07/18/2019)  CURRENT THERAPY: Observation  BRIEF ONCOLOGIC HISTORY:  Oncology History  Breast cancer of upper-outer quadrant of left female breast ([facility])  03/06/2019 Initial Diagnosis   Breast cancer of upper-outer quadrant of left female breast ([facility])   04/24/2019 Cancer Staging   Staging form: Breast, AJCC 8th Edition - Clinical: Stage IB (cT1c, cN1, cM0, G2, ER+, PR+, HER2-) - Signed by Derek Jack, MD on 04/24/2019   07/11/2019 Genetic Testing   Negative genetic testing on the common hereditary cancer panel.  The Common Hereditary Gene Panel offered by [facility] includes sequencing and/or deletion duplication testing of the following 48 genes: APC, ATM, AXIN2, BARD1, BMPR1A, BRCA1, BRCA2, BRIP1, CDH1, CDK4, CDKN2A (p14ARF), CDKN2A (p16INK4a), CHEK2, CTNNA1, DICER1, EPCAM (Deletion/duplication testing only), GREM1 (promoter region deletion/duplication testing only), KIT, MEN1, MLH1, MSH2, MSH3, MSH6, MUTYH, NBN, NF1, NHTL1, PALB2, PDGFRA, PMS2, POLD1, POLE, PTEN, RAD50, RAD51C, RAD51D, RNF43, SDHB, SDHC, SDHD, SMAD4, SMARCA4. STK11, TP53, TSC1, TSC2, and VHL.  The following genes were evaluated for sequence changes only: SDHA and HOXB13 c.251G>A variant only. The report date is July 11, 2019.     CANCER STAGING: Cancer Staging Breast cancer of upper-outer quadrant of left female breast ([facility]) Staging form: Breast, AJCC 8th Edition - Clinical: Stage IB (cT1c, cN1, cM0, G2, ER+, PR+, HER2-) - Signed by Derek Jack, MD on 04/24/2019   INTERVAL HISTORY:  Ms. Keeara C Zachman, a 65 y.o. female, returns for routine follow-up of her history of left breast cancer  s/p lumpectomy and radiation therapy. Nusayba was last seen on 04/01/2020 by Dr. Delton Coombes.   At today's visit, she reports feeling fairly well.  She denies any symptoms of recurrence such as new lumps, bone pain, chest pain, dyspnea, abdominal pain, or persistent headaches.  She denies B symptoms such as unexplained fever, chills, night sweats, and unintentional weight loss.  She performs monthly breast self-examination, and has not noticed any changes. She denies any lymphedema in her affected arm. She continues to take anastrozole without any major problems.  She admits to occasional hot flashes and diaphoresis about 3 times per week.  She has chronic joint pain secondary to osteoarthritis, but denies new bone pain or new muscle pain/stiffness.  She does complain of numbness and neuropathy in all 4 extremities, but especially in her left hand.  Her left hand numbness is being worked up by both orthopedics and neurologist.  Of note, she has a history of left humerus and left wrist fracture, and reports that her left hand numbness has been particularly worse after those fractures.  She continues to take vitamin D and calcium for bone health. Her last DEXA scan was 04/19/2019, which showed T score of -1 indicating osteopenia.  She reports that her current energy level is 25%, with 100% appetite.  She is maintaining a stable weight at this time.   REVIEW OF SYSTEMS:  Review of Systems  Constitutional: Positive for fatigue (25%). Negative for appetite change, chills, diaphoresis, fever and unexpected weight change.  HENT:   Positive for trouble swallowing (History of esophageal stricture). Negative for lump/mass and nosebleeds.   Eyes: Negative for eye problems.  Respiratory: Negative for cough, hemoptysis and shortness  of breath.   Cardiovascular: Negative for chest pain, leg swelling and palpitations.  Gastrointestinal: Negative for abdominal pain, blood in stool, constipation, diarrhea, nausea  and vomiting.  Genitourinary: Negative for hematuria.   Skin: Negative.   Neurological: Positive for light-headedness (Occasional, with standing) and numbness. Negative for dizziness and headaches.  Hematological: Does not bruise/bleed easily.  Psychiatric/Behavioral: The patient is nervous/anxious.     PAST MEDICAL/SURGICAL HISTORY:  Past Medical History:  Diagnosis Date  . Anxiety   . Arthritis   . Breast cancer ([facility])    left  . CTS (carpal tunnel syndrome)   . Depression   . Diabetes mellitus    ORAL MED  . Family history of breast cancer   . GERD (gastroesophageal reflux disease)   . Helicobacter pylori gastritis   . Hiatal hernia   . HNP (herniated nucleus pulposus), lumbar    PT TRYING TO AVOID LUMBAR SURGERY--STATES PAIN IN HER BACK AND SOMETIMES DOWN ONE OR THE OTHER LEG  . Hypertension   . Personal history of radiation therapy   . Plantar fasciitis    BILATERAL  . Schatzki's ring   . Shortness of breath    WITH EXERTION-PT RELATES TO HER WEIGHT  . Sleep apnea    MODERATE- PER SLEEP STUDY 06/2010--PT USES CPAP-SETTING IS 12   Past Surgical History:  Procedure Laterality Date  . ABDOMINAL HYSTERECTOMY    . BREAST LUMPECTOMY    . BREAST LUMPECTOMY WITH RADIOACTIVE SEED AND SENTINEL LYMPH NODE BIOPSY Left 03/27/2019   Procedure: LEFT BREAST LUMPECTOMY WITH RADIOACTIVE SEED AND LEFT AXILLARY DEEP SENTINEL LYMPH NODE BIOPSY INJECT BLUE DYE;  Surgeon: Fanny Skates, MD;  Location: [facility];  Service: General;  Laterality: Left;  . CARPAL TUNNEL RELEASE     BILATERAL  . CHOLECYSTECTOMY    . CYST REMOVED     LEFT THUMB AND RT MIDDLE FINGER  . ESOPHAGECTOMY    . TONSILLECTOMY    . TRIGGER FINGER REPAIR      SOCIAL HISTORY:  Social History   Socioeconomic History  . Marital status: Married    Spouse name: Not on file  . Number of children: 2  . Years of education: GED  . Highest education level: Not on file  Occupational History  . Occupation: Armed forces technical officer: [employer]  Tobacco Use  . Smoking status: Former Smoker    Packs/day: 1.00    Years: 25.00    Pack years: 25.00    Quit date: 03/05/2008    Years since quitting: 12.6  . Smokeless tobacco: Never Used  . Tobacco comment: 03/2008  Vaping Use  . Vaping Use: Never used  Substance and Sexual Activity  . Alcohol use: No  . Drug use: No  . Sexual activity: Not on file    Comment: hysterectomy  Other Topics Concern  . Not on file  Social History Narrative   Lives at home with her husband.   Right-handed.   Caffeine use: 20 ounces per day.   Social Determinants of Health   Financial Resource Strain: Not on file  Food Insecurity: Not on file  Transportation Needs: Not on file  Physical Activity: Not on file  Stress: Not on file  Social Connections: Not on file  Intimate Partner Violence: Not on file    FAMILY HISTORY:  Family History  Problem Relation Age of Onset  . Diabetes Mother   . Kidney disease Mother   . Dementia Mother   .  Diabetes Father   . Kidney Stones Sister   . Diabetes Brother   . Kidney Stones Brother   . Scoliosis Son   . Other Daughter        infanct death  . Kidney cancer Maternal Aunt 80  . Breast cancer Paternal Aunt 33  . Other Sister 51       murdered  . Other Sister 7       murdered  . Other Brother        infant death  . Breast cancer Maternal Aunt        dx over 75  . Breast cancer Paternal Aunt 46  . Breast cancer Paternal Aunt        dx over 63    CURRENT MEDICATIONS:  Current Outpatient Medications  Medication Sig Dispense Refill  . anastrozole (ARIMIDEX) 1 MG tablet TAKE ONE TABLET BY MOUTH DAILY 30 tablet 6  . atorvastatin (LIPITOR) 80 MG tablet Take 80 mg by mouth daily.    . cholecalciferol (VITAMIN D3) 25 MCG (1000 UT) tablet Take 2,000-4,000 Units by mouth See admin instructions. 2000 mcg daily, except once a week, take 4000 mcg daily    . cyanocobalamin (,VITAMIN B-12,) 1000 MCG/ML injection Inject  1,000 mcg into the muscle every 30 (thirty) days.     . DULoxetine (CYMBALTA) 30 MG capsule duloxetine 30 mg capsule,delayed release  TAKE THREE (3) CAPSULES BY MOUTH ONCE DAILY    . Empagliflozin-metFORMIN HCl ER (SYNJARDY XR) 12.10-998 MG TB24 Take 2 tablets by mouth every morning.     . gabapentin (NEURONTIN) 300 MG capsule Take 900 mg by mouth 3 (three) times daily as needed (pain).     Marland Kitchen ibuprofen (ADVIL) 800 MG tablet as needed.     . insulin aspart protamine- aspart (NOVOLOG MIX 70/30) (70-30) 100 UNIT/ML injection Inject 50 Units into the skin 2 (two) times daily with a meal.     . lisinopril (PRINIVIL,ZESTRIL) 20 MG tablet Take 20 mg by mouth at bedtime.    Marland Kitchen oxyCODONE-acetaminophen (PERCOCET/ROXICET) 5-325 MG tablet oxycodone-acetaminophen 5 mg-325 mg tablet  Take 1 tablet every 6 hours by oral route for 5 days.    . pantoprazole (PROTONIX) 40 MG tablet Take 1 tablet (40 mg total) by mouth 2 (two) times daily. (Patient taking differently: Take 40 mg by mouth daily. ) 60 tablet 11  . SYRINGE-NEEDLE, DISP, 3 ML (B-D 3CC LUER-LOK SYR 25GX5/8") 25G X 5/8" 3 ML MISC BD Luer-Lok Syringe 3 mL 25 x 5/8"  USE TO INJECT VITAMIN B12 ONCE A MONTH.    . vitamin B-12 (CYANOCOBALAMIN) 1000 MCG tablet Take 1,000 mcg by mouth daily.     No current facility-administered medications for this visit.    ALLERGIES:  Allergies  Allergen Reactions  . Percocet [Oxycodone-Acetaminophen] Itching    PT CAN TAKE WITH BENADRYL  . Hydrocodone Itching    PT WOULD TAKE WITH BENADRYL PT WOULD TAKE WITH BENADRYL  . Oxycodone-Acetaminophen Itching    PT CAN TAKE WITH BENADRYL    PHYSICAL EXAM:  Performance status (ECOG): 1 - Symptomatic but completely ambulatory  Vitals:   11/05/20 0853  BP: (!) 140/57  Pulse: 80  Resp: 18  Temp: (!) 96.8 F (36 C)  SpO2: 97%   Wt Readings from Last 3 Encounters:  11/05/20 214 lb 8 oz (97.3 kg)  04/01/20 217 lb 3.2 oz (98.5 kg)  11/27/19 217 lb 6.4 oz (98.6 kg)    Physical Exam Constitutional:  Appearance: Normal appearance. She is obese.  HENT:     Head: Normocephalic and atraumatic.     Mouth/Throat:     Mouth: Mucous membranes are moist.  Eyes:     Extraocular Movements: Extraocular movements intact.     Pupils: Pupils are equal, round, and reactive to light.  Cardiovascular:     Rate and Rhythm: Normal rate and regular rhythm.     Pulses: Normal pulses.     Heart sounds: Normal heart sounds.  Pulmonary:     Effort: Pulmonary effort is normal.     Breath sounds: Normal breath sounds.  Chest:  Breasts:     Right: Normal. No axillary adenopathy or supraclavicular adenopathy.     Left: Skin change present. No swelling, inverted nipple, mass, nipple discharge, tenderness, axillary adenopathy or supraclavicular adenopathy.        Comments: Left breast hyperpigmentation; dense fibrous breast tissue without patient of discrete mass  Abdominal:     General: Bowel sounds are normal.     Palpations: Abdomen is soft.     Tenderness: There is no abdominal tenderness.  Musculoskeletal:        General: No swelling.     Right lower leg: No edema.     Left lower leg: No edema.  Lymphadenopathy:     Cervical: No cervical adenopathy.     Upper Body:     Right upper body: No supraclavicular, axillary or pectoral adenopathy.     Left upper body: No supraclavicular, axillary or pectoral adenopathy.  Skin:    General: Skin is warm and dry.  Neurological:     General: No focal deficit present.     Mental Status: She is alert and oriented to person, place, and time.  Psychiatric:        Mood and Affect: Mood normal.        Behavior: Behavior normal.      LABORATORY DATA:  I have reviewed the labs as listed.  CBC Latest Ref Rng & Units 09/30/2020 03/25/2020 11/19/2019  WBC 4.0 - 10.5 K/uL 5.9 5.5 4.6  Hemoglobin 12.0 - 15.0 g/dL 12.0 12.2 11.3(L)  Hematocrit 36.0 - 46.0 % 36.9 37.2 35.1(L)  Platelets 150 - 400 K/uL 227 232 201   CMP  Latest Ref Rng & Units 09/30/2020 03/25/2020 11/19/2019  Glucose 70 - 99 mg/dL 432(H) 276(H) 260(H)  BUN 8 - 23 mg/dL 25(H) 20 17  Creatinine 0.44 - 1.00 mg/dL 1.11(H) 0.98 0.95  Sodium 135 - 145 mmol/L 133(L) 137 135  Potassium 3.5 - 5.1 mmol/L 4.1 4.2 4.0  Chloride 98 - 111 mmol/L 97(L) 101 99  CO2 22 - 32 mmol/L _0 Calcium 8.9 - 10.3 mg/dL 9.3 9.3 9.0  Total Protein 6.5 - 8.1 g/dL 6.7 6.7 7.0  Total Bilirubin 0.3 - 1.2 mg/dL 0.5 0.7 0.5  Alkaline Phos 38 - 126 U/L 97 113 107  AST 15 - 41 U/L 15 16 10(L)  ALT 0 - 44 U/L _1 DIAGNOSTIC IMAGING:  I have independently reviewed the scans and discussed with the patient. No results found.   ASSESSMENT:   1. Stage Ib (T1CN1) left breast IDC: -She is continuing anastrozole without any major problems. -XRT completed on 07/18/2019. -Mammogram on 02/19/2020, BI-RADS Category 2.  2. Family history: -3 paternal aunts had breast cancer. A maternal aunt had breast cancer. -[facility] germline mutation testing was negative.  3. Bone health: -DEXA scan on 04/19/2019 shows T score  of -1.   PLAN:  1. Stage Ib (T1CN1) left breast IDC: -Physical examination today did not reveal any palpable masses. -Reviewed mammogram results from 02/19/2020, BI-RADS Category 2. -Reviewed labs from 09/30/2020. LFTs and CBC are normal.  Mild iron deficiency with ferritin 27 and saturation 18%, but with normal Hgb. - We will repeat mammogram in September 2022 -RTC 6 months for follow-up.  2. Bone health: -Vitamin D low at 21.54. Recommend vitamin D 2000 units daily. -Continue calcium, vitamin D, and weightbearing exercises for breast cancer therapy associated bone loss - We will check DEXA scan in November 2022   PLAN SUMMARY & DISPOSITION:  - Order diagnostic mammogram for September 2022 - Obtain DEXA scan in November 2022 - Start taking ferrous sulfate for mild iron deficiency without anemia - Labs and follow-up in 6 months  All  questions were answered. The patient knows to call the clinic with any problems, questions or concerns.  Medical decision making: Moderate (2 chronic problems, review of prior labs and tests, ordering new labs and tests)  Time spent on visit: I spent 20 minutes counseling the patient face to face. The total time spent in the appointment was 30 minutes and more than 50% was on counseling.   Erica Rush, PA-C 11/05/20 12:16 PM

## 2020-11-05 NOTE — Patient Instructions (Signed)
Erica Giles at [facility] Discharge Instructions  You were seen today by Tarri Abernethy PA-C for your history of left breast cancer.  There were no concerning abnormalities in your labs or physical exam.  We will see you back for follow-up in 6 months.    LABS: Return in 6 months   OTHER TESTS:  - DEXA (bone density scan) in November 2022 - Mammogram in September 2022  MEDICATIONS: Continue anastrazole, vitamin D, and calcium.  START taking ferrous sulfate 325 mg (a.k.a. iron 65 mg) once daily for low iron.  FOLLOW-UP APPOINTMENT: 6 months   Thank you for choosing [facility] at [facility] to provide your oncology and hematology care.  To afford each patient quality time with our provider, please arrive at least 15 minutes before your scheduled appointment time.   If you have a lab appointment with the [facility] please come in thru the Main Entrance and check in at the main information desk.  You need to re-schedule your appointment should you arrive 10 or more minutes late.  We strive to give you quality time with our providers, and arriving late affects you and other patients whose appointments are after yours.  Also, if you no show three or more times for appointments you may be dismissed from the clinic at the providers discretion.     Again, thank you for choosing [facility].  Our hope is that these requests will decrease the amount of time that you wait before being seen by our physicians.       _____________________________________________________________  Should you have questions after your visit to [facility], please contact our office at [phone] and follow the prompts.  Our office hours are 8:00 a.m. and 4:30 p.m. Monday - Friday.  Please note that voicemails left after 4:00 p.m. may not be returned until the following business day.  We are closed weekends and major holidays.  You do have  access to a nurse 24-7, just call the main number to the clinic [phone] and do not press any options, hold on the line and a nurse will answer the phone.    For prescription refill requests, have your pharmacy contact our office and allow 72 hours.    Due to Covid, you will need to wear a mask upon entering the hospital. If you do not have a mask, a mask will be given to you at the Main Entrance upon arrival. For doctor visits, patients may have 1 support person age 11 or older with them. For treatment visits, patients can not have anyone with them due to social distancing guidelines and our immunocompromised population.

## 2020-12-10 ENCOUNTER — Inpatient Hospital Stay: Admit: 2020-12-10 | Payer: PRIVATE HEALTH INSURANCE | Primary: Family Medicine

## 2020-12-10 ENCOUNTER — Inpatient Hospital Stay: Payer: PRIVATE HEALTH INSURANCE | Primary: Family Medicine

## 2020-12-10 ENCOUNTER — Encounter

## 2020-12-10 DIAGNOSIS — M7501 Adhesive capsulitis of right shoulder: Secondary | ICD-10-CM

## 2021-02-02 ENCOUNTER — Encounter

## 2021-02-10 ENCOUNTER — Other Ambulatory Visit (HOSPITAL_COMMUNITY): Payer: Self-pay | Admitting: Physician Assistant

## 2021-02-10 DIAGNOSIS — Z9889 Other specified postprocedural states: Secondary | ICD-10-CM

## 2021-02-21 ENCOUNTER — Other Ambulatory Visit (HOSPITAL_COMMUNITY): Payer: Self-pay | Admitting: Hematology

## 2021-02-21 DIAGNOSIS — C50412 Malignant neoplasm of upper-outer quadrant of left female breast: Secondary | ICD-10-CM

## 2021-02-21 DIAGNOSIS — Z170 Estrogen receptor positive status [ER+]: Secondary | ICD-10-CM

## 2021-02-24 ENCOUNTER — Ambulatory Visit (HOSPITAL_COMMUNITY)
Admission: RE | Admit: 2021-02-24 | Discharge: 2021-02-24 | Disposition: A | Discharge status: Home / Self Care | Payer: BC Managed Care – PPO | Source: Ambulatory Visit | Attending: Physician Assistant | Admitting: Physician Assistant

## 2021-02-24 ENCOUNTER — Other Ambulatory Visit: Payer: Self-pay

## 2021-02-24 DIAGNOSIS — Z9889 Other specified postprocedural states: Secondary | ICD-10-CM

## 2021-02-24 DIAGNOSIS — C50412 Malignant neoplasm of upper-outer quadrant of left female breast: Secondary | ICD-10-CM | POA: Insufficient documentation

## 2021-02-24 DIAGNOSIS — Z170 Estrogen receptor positive status [ER+]: Secondary | ICD-10-CM | POA: Insufficient documentation

## 2021-04-20 ENCOUNTER — Ambulatory Visit (HOSPITAL_COMMUNITY)
Admission: RE | Admit: 2021-04-20 | Discharge: 2021-04-20 | Disposition: A | Discharge status: Home / Self Care | Payer: BC Managed Care – PPO | Source: Ambulatory Visit | Attending: Physician Assistant | Admitting: Physician Assistant

## 2021-04-20 ENCOUNTER — Other Ambulatory Visit: Payer: Self-pay

## 2021-04-20 DIAGNOSIS — M8589 Other specified disorders of bone density and structure, multiple sites: Secondary | ICD-10-CM | POA: Diagnosis not present

## 2021-04-29 ENCOUNTER — Ambulatory Visit: Payer: BC Managed Care – PPO | Admitting: Podiatry

## 2021-05-07 ENCOUNTER — Inpatient Hospital Stay (HOSPITAL_COMMUNITY): Payer: BC Managed Care – PPO | Attending: Hematology

## 2021-05-07 DIAGNOSIS — Z79811 Long term (current) use of aromatase inhibitors: Secondary | ICD-10-CM | POA: Insufficient documentation

## 2021-05-07 DIAGNOSIS — M8589 Other specified disorders of bone density and structure, multiple sites: Secondary | ICD-10-CM

## 2021-05-07 DIAGNOSIS — Z170 Estrogen receptor positive status [ER+]: Secondary | ICD-10-CM | POA: Diagnosis not present

## 2021-05-07 DIAGNOSIS — Z87891 Personal history of nicotine dependence: Secondary | ICD-10-CM | POA: Insufficient documentation

## 2021-05-07 DIAGNOSIS — C50412 Malignant neoplasm of upper-outer quadrant of left female breast: Secondary | ICD-10-CM | POA: Insufficient documentation

## 2021-05-07 DIAGNOSIS — M8580 Other specified disorders of bone density and structure, unspecified site: Secondary | ICD-10-CM | POA: Diagnosis not present

## 2021-05-07 DIAGNOSIS — Z923 Personal history of irradiation: Secondary | ICD-10-CM | POA: Insufficient documentation

## 2021-05-07 DIAGNOSIS — C773 Secondary and unspecified malignant neoplasm of axilla and upper limb lymph nodes: Secondary | ICD-10-CM | POA: Diagnosis not present

## 2021-05-07 LAB — CBC WITH DIFFERENTIAL/PLATELET
Abs Immature Granulocytes: 0.05 10*3/uL (ref 0.00–0.07)
Basophils Absolute: 0 10*3/uL (ref 0.0–0.1)
Basophils Relative: 1 %
Eosinophils Absolute: 0.1 10*3/uL (ref 0.0–0.5)
Eosinophils Relative: 1 %
HCT: 38 % (ref 36.0–46.0)
Hemoglobin: 12.2 g/dL (ref 12.0–15.0)
Immature Granulocytes: 1 %
Lymphocytes Relative: 29 %
Lymphs Abs: 2.2 10*3/uL (ref 0.7–4.0)
MCH: 29.9 pg (ref 26.0–34.0)
MCHC: 32.1 g/dL (ref 30.0–36.0)
MCV: 93.1 fL (ref 80.0–100.0)
Monocytes Absolute: 0.5 10*3/uL (ref 0.1–1.0)
Monocytes Relative: 6 %
Neutro Abs: 4.8 10*3/uL (ref 1.7–7.7)
Neutrophils Relative %: 62 %
Platelets: 228 10*3/uL (ref 150–400)
RBC: 4.08 MIL/uL (ref 3.87–5.11)
RDW: 13.8 % (ref 11.5–15.5)
WBC: 7.7 10*3/uL (ref 4.0–10.5)
nRBC: 0 % (ref 0.0–0.2)

## 2021-05-07 LAB — COMPREHENSIVE METABOLIC PANEL
ALT: 20 U/L (ref 0–44)
AST: 15 U/L (ref 15–41)
Albumin: 4 g/dL (ref 3.5–5.0)
Alkaline Phosphatase: 108 U/L (ref 38–126)
Anion gap: 9 (ref 5–15)
BUN: 20 mg/dL (ref 8–23)
CO2: 27 mmol/L (ref 22–32)
Calcium: 9.4 mg/dL (ref 8.9–10.3)
Chloride: 103 mmol/L (ref 98–111)
Creatinine, Ser: 0.98 mg/dL (ref 0.44–1.00)
GFR, Estimated: >60 mL/min (ref >60)
Glucose, Bld: 203 mg/dL — ABNORMAL HIGH (ref 70–99)
Potassium: 3.8 mmol/L (ref 3.5–5.1)
Sodium: 139 mmol/L (ref 135–145)
Total Bilirubin: 0.3 mg/dL (ref 0.3–1.2)
Total Protein: 7 g/dL (ref 6.5–8.1)

## 2021-05-07 LAB — IRON AND TIBC
Iron: 85 ug/dL (ref 28–170)
Saturation Ratios: 22 % (ref 10.4–31.8)
TIBC: 381 ug/dL (ref 250–450)
UIBC: 296 ug/dL

## 2021-05-07 LAB — FERRITIN: Ferritin: 45 ng/mL (ref 11–307)

## 2021-05-07 LAB — VITAMIN D 25 HYDROXY (VIT D DEFICIENCY, FRACTURES): Vit D, 25-Hydroxy: 42.01 ng/mL (ref 30–100)

## 2021-05-13 NOTE — Progress Notes (Signed)
[place], [place] [address]   CLINIC:  Medical Oncology/Hematology  PCP:  Practice, [facility] / [place] [address] [phone]   REASON FOR VISIT:  Follow-up for left-sided breast cancer  PRIOR THERAPY: Lumpectomy and radiation therapy  CURRENT THERAPY: Anastrozole  BRIEF ONCOLOGIC HISTORY:  Oncology History  Breast cancer of upper-outer quadrant of left female breast ([place])  03/06/2019 Initial Diagnosis   Breast cancer of upper-outer quadrant of left female breast ([place])   04/24/2019 Cancer Staging   Staging form: Breast, AJCC 8th Edition - Clinical: Stage IB (cT1c, cN1, cM0, G2, ER+, PR+, HER2-) - Signed by Derek Jack, MD on 04/24/2019    07/11/2019 Genetic Testing   Negative genetic testing on the common hereditary cancer panel.  The Common Hereditary Gene Panel offered by [facility] includes sequencing and/or deletion duplication testing of the following 48 genes: APC, ATM, AXIN2, BARD1, BMPR1A, BRCA1, BRCA2, BRIP1, CDH1, CDK4, CDKN2A (p14ARF), CDKN2A (p16INK4a), CHEK2, CTNNA1, DICER1, EPCAM (Deletion/duplication testing only), GREM1 (promoter region deletion/duplication testing only), KIT, MEN1, MLH1, MSH2, MSH3, MSH6, MUTYH, NBN, NF1, NHTL1, PALB2, PDGFRA, PMS2, POLD1, POLE, PTEN, RAD50, RAD51C, RAD51D, RNF43, SDHB, SDHC, SDHD, SMAD4, SMARCA4. STK11, TP53, TSC1, TSC2, and VHL.  The following genes were evaluated for sequence changes only: SDHA and HOXB13 c.251G>A variant only. The report date is July 11, 2019.     CANCER STAGING: Cancer Staging  Breast cancer of upper-outer quadrant of left female breast ([place]) Staging form: Breast, AJCC 8th Edition - Clinical: Stage IB (cT1c, cN1, cM0, G2, ER+, PR+, HER2-) - Signed by Derek Jack, MD on 04/24/2019   INTERVAL HISTORY:  [name], a 65 y.o. female, returns for routine follow-up of her left-sided breast cancer. Elishia was last seen on 11/05/2020  by Tarri Abernethy PA-C.  At today's visit, she reports feeling well.  She denies any recent hospitalizations, surgeries, or changes in her baseline health status.  Most recent mammogram on 04/20/2021 showed no evidence of malignancy.  She reports intermittent aching pain at the site of her left-sided lumpectomy, but does not have any other changes to her left breast.  The left arm lymphedema.  No symptoms of recurrence such as new lumps, bone pain, chest pain, dyspnea, or abdominal pain.  She does not have any new headaches, seizures, or focal neurologic deficits.  No B symptoms such as fever, chills, night sweats, or unintentional weight loss.  She has chronic neuropathy in her hands and feet.  She continues to take Arimidex as prescribed.  She reports that she has noticed increased arthritis symptoms.  She denies any hot flashes, mood swings, or unusual weight gain.  She reports 60% energy and 80% appetite.  She is slowly losing weight after being started on Trulicity.   REVIEW OF SYSTEMS:  Review of Systems  Constitutional:  Positive for fatigue (At baseline). Negative for appetite change, chills, diaphoresis, fever and unexpected weight change.  HENT:   Negative for lump/mass and nosebleeds.   Eyes:  Negative for eye problems.  Respiratory:  Negative for cough, hemoptysis and shortness of breath.   Cardiovascular:  Positive for chest pain (Chest wall pain). Negative for leg swelling and palpitations.  Gastrointestinal:  Positive for nausea. Negative for abdominal pain, blood in stool, constipation, diarrhea and vomiting.  Genitourinary:  Negative for hematuria.   Skin: Negative.   Neurological:  Positive for dizziness and numbness. Negative for headaches and light-headedness.  Hematological:  Does not  bruise/bleed easily.   PAST MEDICAL/SURGICAL HISTORY:  Past Medical History:  Diagnosis Date   Anxiety    Arthritis    Breast cancer ([place])    left   CTS (carpal tunnel syndrome)     Depression    Diabetes mellitus    ORAL MED   Family history of breast cancer    GERD (gastroesophageal reflux disease)    Helicobacter pylori gastritis    Hiatal hernia    HNP (herniated nucleus pulposus), lumbar    PT TRYING TO AVOID LUMBAR SURGERY--STATES PAIN IN HER BACK AND SOMETIMES DOWN ONE OR THE OTHER LEG   Hypertension    Personal history of radiation therapy    Plantar fasciitis    BILATERAL   Schatzki's ring    Shortness of breath    WITH EXERTION-PT RELATES TO HER WEIGHT   Sleep apnea    MODERATE- PER SLEEP STUDY 06/2010--PT USES CPAP-SETTING IS 11   Past Surgical History:  Procedure Laterality Date   ABDOMINAL HYSTERECTOMY     BREAST LUMPECTOMY     BREAST LUMPECTOMY WITH RADIOACTIVE SEED AND SENTINEL LYMPH NODE BIOPSY Left 03/27/2019   Procedure: LEFT BREAST LUMPECTOMY WITH RADIOACTIVE SEED AND LEFT AXILLARY DEEP SENTINEL LYMPH NODE BIOPSY INJECT BLUE DYE;  Surgeon: Fanny Skates, MD;  Location: [facility] OR;  Service: General;  Laterality: Left;   CARPAL TUNNEL RELEASE     BILATERAL   CHOLECYSTECTOMY     CYST REMOVED     LEFT THUMB AND RT MIDDLE FINGER   ESOPHAGECTOMY     TONSILLECTOMY     TRIGGER FINGER REPAIR      SOCIAL HISTORY:  Social History   Socioeconomic History   Marital status: Married    Spouse name: Not on file   Number of children: 2   Years of education: GED   Highest education level: Not on file  Occupational History   Occupation: Scientist, product/process development: [employer]  Tobacco Use   Smoking status: Former    Packs/day: 1.00    Years: 25.00    Pack years: 25.00    Types: Cigarettes    Quit date: 03/05/2008    Years since quitting: 13.1   Smokeless tobacco: Never   Tobacco comments:    03/2008  Vaping Use   Vaping Use: Never used  Substance and Sexual Activity   Alcohol use: No   Drug use: No   Sexual activity: Not on file    Comment: hysterectomy  Other Topics Concern   Not on file  Social History Narrative   Lives  at home with her husband.   Right-handed.   Caffeine use: 20 ounces per day.   Social Determinants of Health   Financial Resource Strain: Not on file  Food Insecurity: Not on file  Transportation Needs: Not on file  Physical Activity: Not on file  Stress: Not on file  Social Connections: Not on file  Intimate Partner Violence: Not on file    FAMILY HISTORY:  Family History  Problem Relation Age of Onset   Diabetes Mother    Kidney disease Mother    Dementia Mother    Diabetes Father    Kidney Stones Sister    Diabetes Brother    Kidney Stones Brother    Scoliosis Son    Other Daughter        infanct death   Kidney cancer Maternal Aunt 29   Breast cancer Paternal Aunt 54   Other Sister 75  murdered   Other Sister 7       murdered   Other Brother        infant death   Breast cancer Maternal Aunt        dx over 33   Breast cancer Paternal Aunt 62   Breast cancer Paternal Aunt        dx over 52    CURRENT MEDICATIONS:  Current Outpatient Medications  Medication Sig Dispense Refill   anastrozole (ARIMIDEX) 1 MG tablet TAKE ONE TABLET BY MOUTH DAILY 30 tablet 6   atorvastatin (LIPITOR) 80 MG tablet Take 80 mg by mouth daily.     cholecalciferol (VITAMIN D3) 25 MCG (1000 UT) tablet Take 2,000-4,000 Units by mouth See admin instructions. 2000 mcg daily, except once a week, take 4000 mcg daily     cyanocobalamin (,VITAMIN B-12,) 1000 MCG/ML injection Inject 1,000 mcg into the muscle every 30 (thirty) days.      DULoxetine (CYMBALTA) 30 MG capsule duloxetine 30 mg capsule,delayed release  TAKE THREE (3) CAPSULES BY MOUTH ONCE DAILY     Empagliflozin-metFORMIN HCl ER (SYNJARDY XR) 12.10-998 MG TB24 Take 2 tablets by mouth every morning.      gabapentin (NEURONTIN) 300 MG capsule Take 900 mg by mouth 3 (three) times daily as needed (pain).      ibuprofen (ADVIL) 800 MG tablet as needed.      insulin NPH-regular Human (NOVOLIN 70/30) (70-30) 100 UNIT/ML injection       lisinopril (PRINIVIL,ZESTRIL) 20 MG tablet Take 20 mg by mouth at bedtime.     pantoprazole (PROTONIX) 40 MG tablet Take 1 tablet (40 mg total) by mouth 2 (two) times daily. (Patient taking differently: Take 40 mg by mouth daily.) 60 tablet 11   SYRINGE-NEEDLE, DISP, 3 ML (B-D 3CC LUER-LOK SYR 25GX5/8") 25G X 5/8" 3 ML MISC BD Luer-Lok Syringe 3 mL 25 x 5/8"  USE TO INJECT VITAMIN B12 ONCE A MONTH.     No current facility-administered medications for this visit.    ALLERGIES:  Allergies  Allergen Reactions   Celecoxib Other (See Comments)   Percocet [Oxycodone-Acetaminophen] Itching    PT CAN TAKE WITH BENADRYL   Hydrocodone Itching    PT WOULD TAKE WITH BENADRYL PT WOULD TAKE WITH BENADRYL   Oxycodone-Acetaminophen Itching    PT CAN TAKE WITH BENADRYL    PHYSICAL EXAM:  Performance status (ECOG): 1 - Symptomatic but completely ambulatory  There were no vitals filed for this visit. Wt Readings from Last 3 Encounters:  11/05/20 214 lb 8 oz (97.3 kg)  04/01/20 217 lb 3.2 oz (98.5 kg)  11/27/19 217 lb 6.4 oz (98.6 kg)   Physical Exam Constitutional:      Appearance: Normal appearance. She is obese.  HENT:     Head: Normocephalic and atraumatic.     Mouth/Throat:     Mouth: Mucous membranes are moist.  Eyes:     Extraocular Movements: Extraocular movements intact.     Pupils: Pupils are equal, round, and reactive to light.  Cardiovascular:     Rate and Rhythm: Normal rate and regular rhythm.     Pulses: Normal pulses.     Heart sounds: Normal heart sounds.  Pulmonary:     Effort: Pulmonary effort is normal.     Breath sounds: Normal breath sounds.  Chest:  Breasts:    Right: Normal.     Left: Skin change present. No swelling, inverted nipple, mass, nipple discharge or tenderness.  Comments: Left breast hyperpigmentation; dense fibrous breast tissue without patient of discrete mass  Abdominal:     General: Bowel sounds are normal.     Palpations: Abdomen is  soft.     Tenderness: There is no abdominal tenderness.  Musculoskeletal:        General: No swelling.     Right lower leg: No edema.     Left lower leg: No edema.  Lymphadenopathy:     Cervical: No cervical adenopathy.     Upper Body:     Right upper body: No supraclavicular, axillary or pectoral adenopathy.     Left upper body: No supraclavicular, axillary or pectoral adenopathy.  Skin:    General: Skin is warm and dry.  Neurological:     General: No focal deficit present.     Mental Status: She is alert and oriented to person, place, and time.  Psychiatric:        Mood and Affect: Mood normal.        Behavior: Behavior normal.     LABORATORY DATA:  I have reviewed the labs as listed.  CBC Latest Ref Rng & Units 05/07/2021 09/30/2020 03/25/2020  WBC 4.0 - 10.5 K/uL 7.7 5.9 5.5  Hemoglobin 12.0 - 15.0 g/dL 12.2 12.0 12.2  Hematocrit 36.0 - 46.0 % 38.0 36.9 37.2  Platelets 150 - 400 K/uL 228 227 232   CMP Latest Ref Rng & Units 05/07/2021 09/30/2020 03/25/2020  Glucose 70 - 99 mg/dL 203(H) 432(H) 276(H)  BUN 8 - 23 mg/dL 20 25(H) 20  Creatinine 0.44 - 1.00 mg/dL 0.98 1.11(H) 0.98  Sodium 135 - 145 mmol/L 139 133(L) 137  Potassium 3.5 - 5.1 mmol/L 3.8 4.1 4.2  Chloride 98 - 111 mmol/L 103 97(L) 101  CO2 22 - 32 mmol/L _0 Calcium 8.9 - 10.3 mg/dL 9.4 9.3 9.3  Total Protein 6.5 - 8.1 g/dL 7.0 6.7 6.7  Total Bilirubin 0.3 - 1.2 mg/dL 0.3 0.5 0.7  Alkaline Phos 38 - 126 U/L 108 97 113  AST 15 - 41 U/L _1 ALT 0 - 44 U/L _2 DIAGNOSTIC IMAGING:  I have independently reviewed the scans and discussed with the patient. DG BONE DENSITY (DXA)  Result Date: 04/20/2021 EXAM: DUAL X-RAY ABSORPTIOMETRY (DXA) FOR BONE MINERAL DENSITY IMPRESSION: Your patient Erica Giles completed a BMD test on 04/20/2021 using the [place] (software version: 14.10) manufactured by [employer]. The following summarizes the results of our evaluation.  Technologist: [facility] PATIENT BIOGRAPHICAL: Name: Jonalyn, Sedlak C Patient ID: [id number] Birth Date: 05/17/1956 Height: 64.5 in. Gender: Female Exam Date: 04/20/2021 Weight: 212.0 lbs. Indications: Breast Ca, Caucasian, Diabetic-insulin dependent, Height Loss, History of Fracture (Adult), Hx Breast Ca, Parent Hip Fracture, Partial Hysterectomy, Post Menopausal, Family Hist. (Parent hip fracture) Fractures: Humerus, Wrist Treatments: Anastrozole, Calcium, Vitamin D DENSITOMETRY RESULTS: Site          Region     Measured Date Measured Age WHO Classification Young Adult T-score BMD         %Change vs. Previous Significant Change (*) DualFemur Neck Left 04/20/2022 64.2 Osteopenia -1.3 0.859 g/cm2 -3.7% - DualFemur Neck Left 04/19/2019 62.2 Normal -1.0 0.892 g/cm2 - - DualFemur Total Mean 04/20/2021 64.2 Normal -0.9 0.899 g/cm2 -4.4% Yes DualFemur Total Mean 04/19/2019 62.2 Normal -0.5 0.940 g/cm2 - - Right Forearm Radius 33% 04/20/2021 64.2 Normal -1.0 0.639 g/cm2 - - ASSESSMENT: The BMD measured at Femur  Neck Left is 0.859 g/cm2 with a T-score of -1.3. This patient is considered osteopenic according to [facility]) criteria. The scan quality is good. Compared with the prior study on 04/19/2019, the BMD of the total mean shows a statistically significant decrease. The lt. forearm was excluded this time due to surgical repair. World Pharmacologist [facility]) criteria for post-menopausal, Caucasian Women: Normal:       T-score at or above -1 SD Osteopenia:   T-score between -1 and -2.5 SD Osteoporosis: T-score at or below -2.5 SD RECOMMENDATIONS: 1. All patients should optimize calcium and vitamin D intake. 2. Consider FDA-approved medical therapies in postmenopausal women and med aged 45 years and older, based on the following: a. A hip or vertebral (clinical or morphometric) fracture b. T-score< -2.5 at the femoral neck or spine after appropriate evaluation to exclude secondary causes c. Low bone mass  (T-score between -1.0 and -2.5 at the femoral neck or spine) and a 10-year probability of a hip fracture > 3% or a 10-year probability of a major osteoporosis-related fracture > 20% based on the [place]-adapted WHO algorithm d. Clinician judgment and/or patient preferences may indicate treatment for people with 10-year fracture probabilities above or below these levels FOLLOW-UP: Patients with diagnosis of osteoporsis or at high risk for fracture should have regular bone mineral density tests. For patients eligible for Medicare, routine testing is allowed once every 2 years. The testing frequency can be increased to one year for patients who have rapidly progressing disease, those who are receiving or discontinuing medical therapy to restore bone mass, or have additional risk factors. I have reviewed this report, and agree with the above findings. [facility] Your patient Valeree C Loux completed a FRAX assessment on 04/20/2021 using the [place] (analysis version: 14.10) manufactured by [employer]. The following summarizes the results of our evaluation. PATIENT BIOGRAPHICAL: Name: Raphaela, Cannaday C Patient ID: [id number] Birth Date: [birth year] Height:    64.5 in. Gender:     [age]    [age]       Weight:    212.0 lbs. Ethnicity:  White                            Exam Date: 04/20/2021 FRAX* RESULTS:  (version: 3.5) 10-year Probability of Fracture1 Major Osteoporotic Fracture2 Hip Fracture 24.4% 1.1% Population: [place] (Caucasian) Risk Factors: History of Fracture (Adult), Family Hist. (Parent hip fracture) Based on DualFemur (Left) Neck BMD 1 -The 10-year probability of fracture may be lower than reported if the patient has received treatment. 2 -Major Osteoporotic Fracture: Clinical Spine, Forearm, Hip or Shoulder *FRAX is a Materials engineer of the [employer] for Metabolic Bone Disease, a [place] (WHO) [facility]. ASSESSMENT:  The probability of a major osteoporotic fracture is 24.4% within the next ten years. The probability of a hip fracture is 1.1% within the next ten years. Electronically Signed   By: Rolm Baptise M.D.   On: 04/20/2021 15:08     ASSESSMENT & PLAN: 1.  Stage Ib (T1CN1) left breast IDC: - Initial diagnosis September 2020, with a screening mammogram (02/16/2019) showing suspicious 0.9 cm mass in the 230 to 3 o'clock position of the left breast - Ultrasound-guided biopsy (02/20/2019): Grade 2 invasive ductal carcinoma, positive for ER/PR and negative for HER2. - Lumpectomy and sentinel lymph node biopsy by Dr. Dalbert Batman  on 03/17/2019.  Pathology showed grade 2 IDC, 1.1 cm, negative margins, negative LVSI/perineural invasion, 1 out of 1 lymph node positive for metastatic carcinoma measuring 2.7 mm. - Oncotype DX results showed recurrence score of 8.  No apparent benefit from chemotherapy.  9-year breast cancer specific survival is 98%. - Completed radiation therapy on 07/18/2019 - She started anastrozole on 04/24/2019, with goal of at least 5 years of therapy.  She is continuing anastrozole without any major problems apart from some increased arthralgia. - Most recent mammogram (02/24/2021): BI-RADS Category 2, benign.  Stable left breast lumpectomy site, no mammographic evidence of malignancy in the bilateral breasts. - Physical examination today did not reveal any palpable masses.     - No red flag symptoms of recurrence. - Most recent labs (05/07/2021): Normal CBC without evidence of anemia, CMP unremarkable apart from elevated blood sugar.  Normal LFTs and renal function.  Normal iron levels with ferritin 45 and iron saturation 22%.  - PLAN: As patient is now 2+ years s/p lumpectomy, she may return to annual screening mammography, next due in September 2023. - Repeat labs and RTC in 6 months for office visit and physical exam.     2.  Bone health: - DEXA scan on 04/19/2019 shows T score of -1. - DEXA scan on  04/20/2021 shows T score of -1.3, mild osteopenia - Most recent vitamin D (05/07/2021): Normal at 42.01 - PLAN: We will start patient on weekly Fosamax 35 mg, since she is showing slightly worsening bone density and still has at least 2.5 more years of anastrozole.  We have discussed risks and benefits of treatment, and patient is agreeable to proceed.  Prescription has been sent to pharmacy, but patient has been strictly instructed to NOT start taking her Fosamax until she has seen her dentist and received dental clearance.  She verbalizes understanding and willingness to comply. - Continue calcium, vitamin D, and weightbearing exercises for bone loss associated with breast cancer therapy. - Repeat DEXA scan in 2 years, since patient is currently on aromatase inhibitor therapy   3.  Family history: -3 paternal aunts had breast cancer.  A maternal aunt had breast cancer. -[facility] germline mutation testing was negative.   PLAN SUMMARY & DISPOSITION: Labs and RTC in 6 months Prescription sent for Fosamax 35 mg weekly  All questions were answered. The patient knows to call the clinic with any problems, questions or concerns.  Medical decision making: Moderate  Time spent on visit: I spent 20 minutes counseling the patient face to face. The total time spent in the appointment was 30 minutes and more than 50% was on counseling.   Harriett Rush, PA-C  05/14/2021 11:12 PM

## 2021-05-14 ENCOUNTER — Inpatient Hospital Stay (HOSPITAL_COMMUNITY): Payer: BC Managed Care – PPO | Admitting: Physician Assistant

## 2021-05-14 VITALS — BP 113/60 | HR 80 | Temp 97.0°F | Resp 18 | Ht 64.57 in | Wt 205.7 lb

## 2021-05-14 DIAGNOSIS — M2550 Pain in unspecified joint: Secondary | ICD-10-CM | POA: Diagnosis not present

## 2021-05-14 DIAGNOSIS — T451X5A Adverse effect of antineoplastic and immunosuppressive drugs, initial encounter: Secondary | ICD-10-CM

## 2021-05-14 DIAGNOSIS — M85852 Other specified disorders of bone density and structure, left thigh: Secondary | ICD-10-CM | POA: Diagnosis not present

## 2021-05-14 DIAGNOSIS — D649 Anemia, unspecified: Secondary | ICD-10-CM

## 2021-05-14 DIAGNOSIS — C50412 Malignant neoplasm of upper-outer quadrant of left female breast: Secondary | ICD-10-CM | POA: Diagnosis not present

## 2021-05-14 DIAGNOSIS — Z79811 Long term (current) use of aromatase inhibitors: Secondary | ICD-10-CM

## 2021-05-14 DIAGNOSIS — Z170 Estrogen receptor positive status [ER+]: Secondary | ICD-10-CM

## 2021-05-14 MED ORDER — ALENDRONATE SODIUM 35 MG PO TABS: 35.0000 mg | ORAL_TABLET | ORAL | 6 refills | Status: DC

## 2021-05-14 NOTE — Patient Instructions (Signed)
[place] at [facility] Discharge Instructions  You were seen today by Tarri Abernethy PA-C for your history of left-sided breast cancer.  There is no concern for recurrent breast cancer at this time, and your most recent mammogram did not show any signs of malignancy.  We will see you for another follow-up visit in 6 months and we will check another mammogram in a year.  Continue to take anastrozole as prescribed.  This type of medication can cause weakening of your bones, and your most recent bone scan shows "osteopenia" (slightly weak bones).  Because of this, we will start you on alendronate (brand name Fosamax) 35 mg once per week.  Continue to take your calcium and vitamin D and practice incorporating weightbearing exercises into your daily routine.  LABS: Return in 6 months for repeat labs  MEDICATIONS: Start taking alendronate 35 mg weekly.  Please read the attached handout for further information about this medication.  ** NOTE: It is EXTREMELY IMPORTANT that you obtain dental clearance before starting this medication.  Prescription has been sent to your pharmacy, but DO NOT start taking this medication until you have been evaluated by your dentist and given their permission to proceed.  FOLLOW-UP APPOINTMENT: Office visit in 6 months, after labs   Thank you for choosing [place] at [facility] to provide your oncology and hematology care.  To afford each patient quality time with our provider, please arrive at least 15 minutes before your scheduled appointment time.   If you have a lab appointment with the [facility] please come in thru the Main Entrance and check in at the main information desk.  You need to re-schedule your appointment should you arrive 10 or more minutes late.  We strive to give you quality time with our providers, and arriving late affects you and other patients whose appointments are after yours.  Also, if you  no show three or more times for appointments you may be dismissed from the clinic at the providers discretion.     Again, thank you for choosing [facility].  Our hope is that these requests will decrease the amount of time that you wait before being seen by our physicians.       _____________________________________________________________  Should you have questions after your visit to [facility], please contact our office at [phone] and follow the prompts.  Our office hours are 8:00 a.m. and 4:30 p.m. Monday - Friday.  Please note that voicemails left after 4:00 p.m. may not be returned until the following business day.  We are closed weekends and major holidays.  You do have access to a nurse 24-7, just call the main number to the clinic [phone] and do not press any options, hold on the line and a nurse will answer the phone.    For prescription refill requests, have your pharmacy contact our office and allow 72 hours.    Due to Covid, you will need to wear a mask upon entering the hospital. If you do not have a mask, a mask will be given to you at the Main Entrance upon arrival. For doctor visits, patients may have 1 support person age 31 or older with them. For treatment visits, patients can not have anyone with them due to social distancing guidelines and our immunocompromised population.

## 2021-06-12 ENCOUNTER — Other Ambulatory Visit: Payer: Self-pay

## 2021-06-12 ENCOUNTER — Ambulatory Visit: Payer: BC Managed Care – PPO | Admitting: Podiatry

## 2021-06-12 DIAGNOSIS — Q666 Other congenital valgus deformities of feet: Secondary | ICD-10-CM | POA: Diagnosis not present

## 2021-06-12 DIAGNOSIS — E119 Type 2 diabetes mellitus without complications: Secondary | ICD-10-CM

## 2021-06-17 NOTE — Progress Notes (Signed)
Subjective: Erica Giles presents today referred by Practice, [facility] for diabetic foot evaluation.  Patient relates year history of diabetes.  Patient denies any hi 2021 story of foot wounds.  Patient has  history of numbness, tingling, burning, pins/needles sensations.  Past Medical History:  Diagnosis Date   Anxiety    Arthritis    Breast cancer ([place])    left   CTS (carpal tunnel syndrome)    Depression    Diabetes mellitus    ORAL MED   Family history of breast cancer    GERD (gastroesophageal reflux disease)    Helicobacter pylori gastritis    Hiatal hernia    HNP (herniated nucleus pulposus), lumbar    PT TRYING TO AVOID LUMBAR SURGERY--STATES PAIN IN HER BACK AND SOMETIMES DOWN ONE OR THE OTHER LEG   Hypertension    Personal history of radiation therapy    Plantar fasciitis    BILATERAL   Schatzki's ring    Shortness of breath    WITH EXERTION-PT RELATES TO HER WEIGHT   Sleep apnea    MODERATE- PER SLEEP STUDY 06/2010--PT USES CPAP-SETTING IS 11    Patient Active Problem List   Diagnosis Date Noted   Neck pain 10/25/2019   DM type 2 with diabetic peripheral neuropathy ([place]) 10/25/2019   Genetic testing 07/12/2019   Family history of breast cancer    Breast cancer of upper-outer quadrant of left female breast ([place]) 03/06/2019   GERD (gastroesophageal reflux disease) 12/11/2013   Hemorrhoids 12/11/2013   DM2 (diabetes mellitus, type 2) ([place]) 12/11/2013   DDD (degenerative disc disease) 12/11/2013   HLD (hyperlipidemia) 12/11/2013   S/P cholecystectomy 12/11/2013    Past Surgical History:  Procedure Laterality Date   ABDOMINAL HYSTERECTOMY     BREAST LUMPECTOMY     BREAST LUMPECTOMY WITH RADIOACTIVE SEED AND SENTINEL LYMPH NODE BIOPSY Left 03/27/2019   Procedure: LEFT BREAST LUMPECTOMY WITH RADIOACTIVE SEED AND LEFT AXILLARY DEEP SENTINEL LYMPH NODE BIOPSY INJECT BLUE DYE;  Surgeon: Fanny Skates, MD;  Location: [facility] OR;  Service: General;   Laterality: Left;   CARPAL TUNNEL RELEASE     BILATERAL   CHOLECYSTECTOMY     CYST REMOVED     LEFT THUMB AND RT MIDDLE FINGER   ESOPHAGECTOMY     TONSILLECTOMY     TRIGGER FINGER REPAIR      Current Outpatient Medications on File Prior to Visit  Medication Sig Dispense Refill   alendronate (FOSAMAX) 35 MG tablet Take 1 tablet (35 mg total) by mouth every 7 (seven) days. Take with a full glass of water on an empty stomach. 5 tablet 6   anastrozole (ARIMIDEX) 1 MG tablet TAKE ONE TABLET BY MOUTH DAILY 30 tablet 6   atorvastatin (LIPITOR) 80 MG tablet Take 80 mg by mouth daily.     cholecalciferol (VITAMIN D3) 25 MCG (1000 UT) tablet Take 2,000-4,000 Units by mouth See admin instructions. 2000 mcg daily, except once a week, take 4000 mcg daily     cyanocobalamin (,VITAMIN B-12,) 1000 MCG/ML injection Inject 1,000 mcg into the muscle every 30 (thirty) days.      DULoxetine (CYMBALTA) 30 MG capsule duloxetine 30 mg capsule,delayed release  TAKE THREE (3) CAPSULES BY MOUTH ONCE DAILY     Empagliflozin-metFORMIN HCl ER (SYNJARDY XR) 12.10-998 MG TB24 Take 2 tablets by mouth every morning.      gabapentin (NEURONTIN) 300 MG capsule Take 900 mg by mouth 3 (three) times daily as needed (pain).  ibuprofen (ADVIL) 800 MG tablet as needed.      insulin NPH-regular Human (NOVOLIN 70/30) (70-30) 100 UNIT/ML injection      lisinopril (PRINIVIL,ZESTRIL) 20 MG tablet Take 20 mg by mouth at bedtime.     pantoprazole (PROTONIX) 40 MG tablet Take 1 tablet (40 mg total) by mouth 2 (two) times daily. (Patient taking differently: Take 40 mg by mouth daily.) 60 tablet 11   SYRINGE-NEEDLE, DISP, 3 ML (B-D 3CC LUER-LOK SYR 25GX5/8") 25G X 5/8" 3 ML MISC BD Luer-Lok Syringe 3 mL 25 x 5/8"  USE TO INJECT VITAMIN B12 ONCE A MONTH.     TRULICITY 1.5 UX/3.2GM SOPN Inject into the skin.     No current facility-administered medications on file prior to visit.     Allergies  Allergen Reactions   Celecoxib Other  (See Comments)   Exenatide     Other reaction(s): knots at injection sites for months   Percocet [Oxycodone-Acetaminophen] Itching    PT CAN TAKE WITH BENADRYL   Acetaminophen Rash    Other reaction(s): Other (See Comments)   Hydrocodone Itching    PT WOULD TAKE WITH BENADRYL PT WOULD TAKE WITH BENADRYL   Oxycodone Hcl Rash   Oxycodone-Acetaminophen Itching    PT CAN TAKE WITH BENADRYL    Social History   Occupational History   Occupation: Scientist, product/process development: [employer]  Tobacco Use   Smoking status: Former    Packs/day: 1.00    Years: 25.00    Pack years: 25.00    Types: Cigarettes    Quit date: 03/05/2008    Years since quitting: 13.2   Smokeless tobacco: Never   Tobacco comments:    03/2008  Vaping Use   Vaping Use: Never used  Substance and Sexual Activity   Alcohol use: No   Drug use: No   Sexual activity: Not on file    Comment: hysterectomy    Family History  Problem Relation Age of Onset   Diabetes Mother    Kidney disease Mother    Dementia Mother    Diabetes Father    Kidney Stones Sister    Diabetes Brother    Kidney Stones Brother    Scoliosis Son    Other Daughter        infanct death   Kidney cancer Maternal Aunt 19   Breast cancer Paternal Aunt 52   Other Sister 27       murdered   Other Sister 29       murdered   Other Brother        infant death   Breast cancer Maternal Aunt        dx over 62   Breast cancer Paternal Aunt 77   Breast cancer Paternal Aunt        dx over 79    Immunization History  Administered Date(s) Administered   Influenza Split 02/18/2008   Influenza-Unspecified 03/23/2018, 03/18/2020   Pneumococcal Polysaccharide-23 02/18/2008    Review of systems: Positive Findings in bold print.  Constitutional:  chills, fatigue, fever, sweats, weight change Communication: Optometrist, sign Ecologist, hand writing, iPad/Android device Head: headaches, head injury Eyes: changes in vision, eye pain,  glaucoma, cataracts, macular degeneration, diplopia, glare,  light sensitivity, eyeglasses or contacts, blindness Ears nose mouth throat: hearing impaired, hearing aids,  ringing in ears, deaf, sign language,  vertigo, nosebleeds,  rhinitis,  cold sores, snoring, swollen glands Cardiovascular: HTN, edema, arrhythmia, pacemaker in place, defibrillator in place,  chest pain/tightness, chronic anticoagulation, blood clot, heart failure, MI Peripheral Vascular: leg cramps, varicose veins, blood clots, lymphedema, varicosities Respiratory:  asthma, difficulty breathing, denies congestion, SOB, wheezing, cough, emphysema Gastrointestinal: change in appetite or weight, abdominal pain, constipation, diarrhea, nausea, vomiting, vomiting blood, change in bowel habits, abdominal pain, jaundice, rectal bleeding, hemorrhoids, GERD Genitourinary:  nocturia,  pain on urination, polyuria,  blood in urine, Foley catheter, urinary urgency, ESRD on hemodialysis Musculoskeletal: amputation, cramping, stiff joints, painful joints, decreased joint motion, fractures, OA, gout, hemiplegia, paraplegia, uses cane, wheelchair bound, uses walker, uses rollator Skin: +changes in toenails, color change, dryness, itching, mole changes,  rash, wound(s) Neurological: headaches, numbness in feet, paresthesias in feet, burning in feet, fainting,  seizures, change in speech, migraines, memory problems/poor historian, cerebral palsy, weakness, paralysis, CVA, TIA Endocrine: diabetes, hypothyroidism, hyperthyroidism,  goiter, dry mouth, flushing, heat intolerance, cold intolerance,  excessive thirst, denies polyuria,  nocturia Hematological:  easy bleeding, excessive bleeding, easy bruising, enlarged lymph nodes, on long term blood thinner, history of past transusions Allergy/immunological:  hives, eczema, frequent infections, multiple drug allergies, seasonal allergies, transplant recipient, multiple food allergies Psychiatric:  anxiety,  depression, mood disorder, suicidal ideations, hallucinations, insomnia  Objective: There were no vitals filed for this visit. Vascular Examination: Capillary refill time less than 5 seconds x 10 digits.  Dorsalis pedis pulses palpable 2 out of 4.  Posterior tibial pulses palpable 2 out of 4.  Digital hair not present x 10 digits.  Skin temperature gradient WNL b/l.  Dermatological Examination: Skin with normal turgor, texture and tone b/l  Toenails 1-5 b/l discolored, thick, dystrophic with subungual debris and pain with palpation to nailbeds due to thickness of nails.  Musculoskeletal: Muscle strength 5/5 to all LE muscle groups.  Neurological: Sensation intact with 10 gram monofilament.  Vibratory sensation intact.  Pes planovalgus foot structure noted with calcaneovalgus to many toe signs partially recruit the arch with dorsiflexion of the hallux  Assessment: NIDDM Encounter for diabetic foot examination Pes planovalgus  Plan: Discussed diabetic foot care principles. Literature dispensed on today. Patient to continue soft, supportive shoe gear daily. Patient to report any pedal injuries to medical professional immediately. Follow up one year. Patient/POA to call should there be a concern in the interim.  Pes planovalgus -I explained to patient the etiology of pes planovalgus and relationship with arch and heel support and various treatment options were discussed.  Given patient foot structure in the setting of arch and heel support I believe patient will benefit from custom-made orthotics to help control the hindfoot motion support the arch of the foot and take the stress away from arch and heel support patient agrees with the plan like to proceed with orthotics -Patient was casted for orthotics

## 2021-06-20 IMAGING — MG MM DIGITAL DIAGNOSTIC UNILAT*L* W/ TOMO W/ CAD
6 series · 6 of 18 positions shown · non-contrast
Comparison: December 28, 2018

CLINICAL DATA: 63-year-old patient recalled recent outside
screening mammogram for evaluation of a possible left breast mass.

EXAM:
DIGITAL DIAGNOSTIC LEFT MAMMOGRAM WITH CAD AND TOMO
ULTRASOUND LEFT BREAST

[L ML synth-2D]
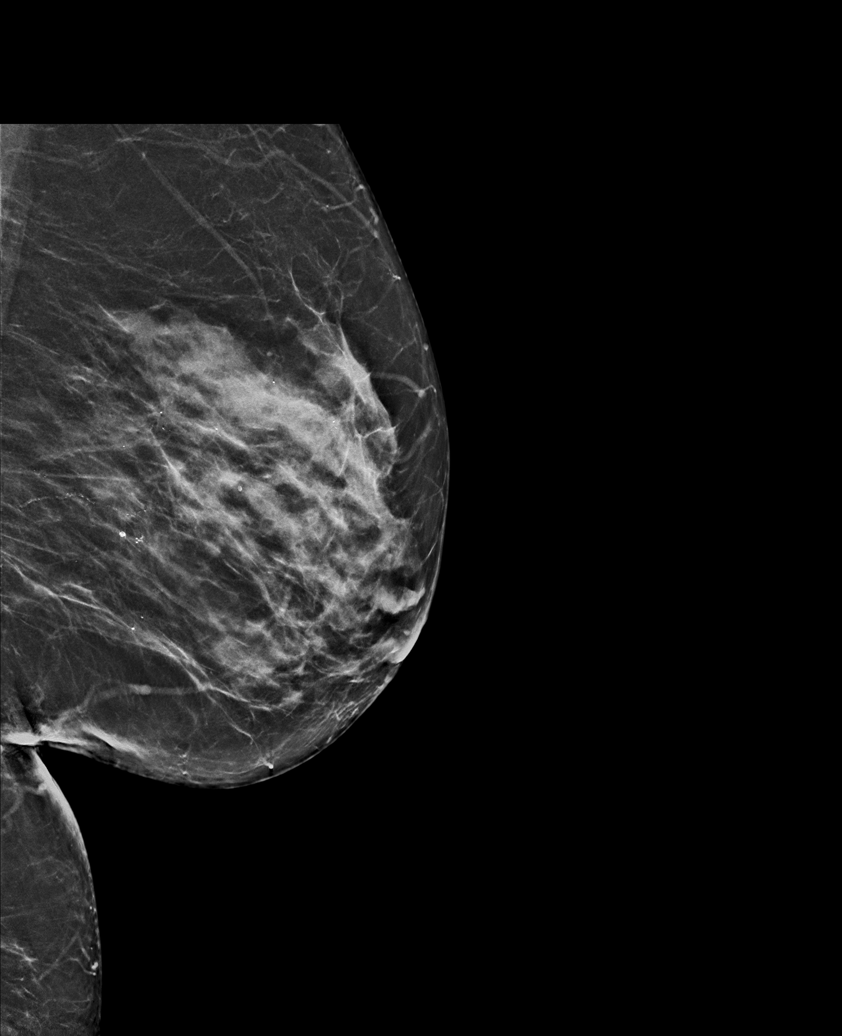

[L CC synth-2D (1 of 2)]
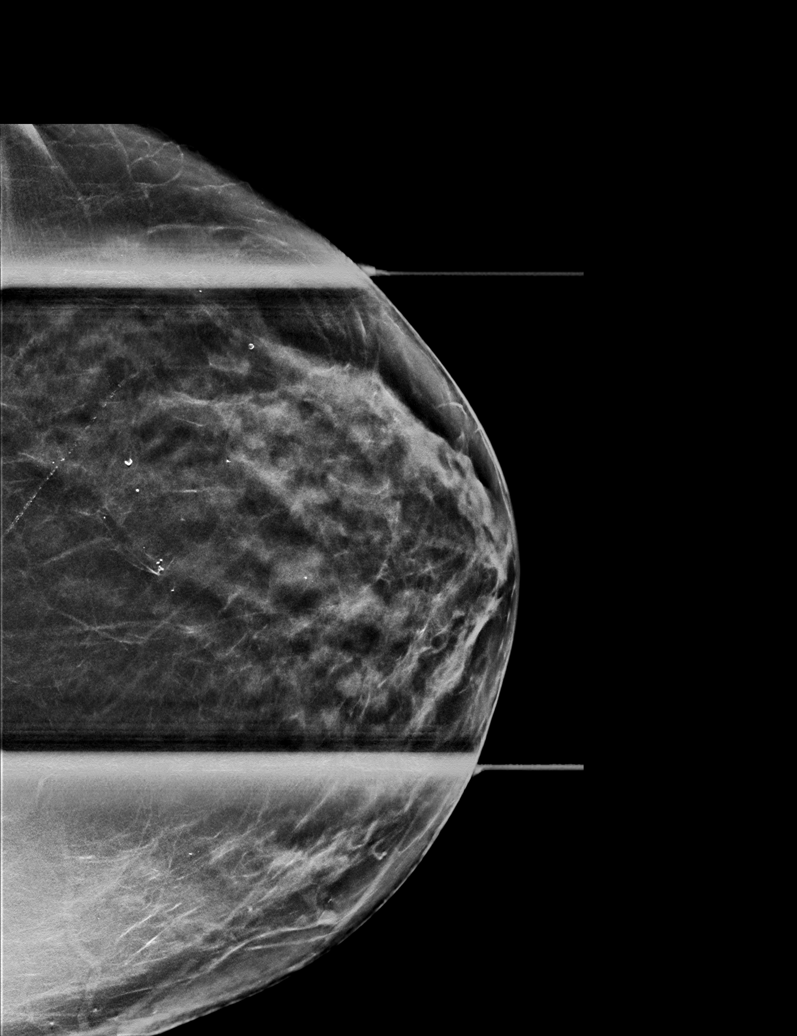

[L CC synth-2D (2 of 2)]
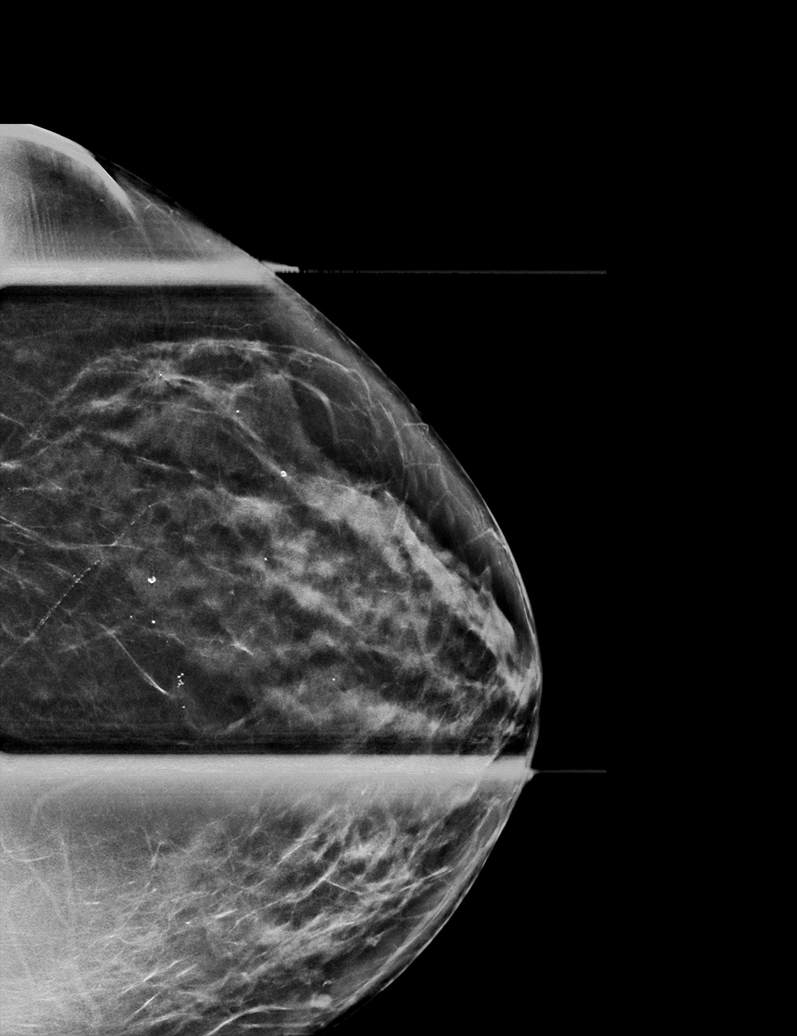

[L CC tomo (1 of 2) · tomo slice 29/56.0]
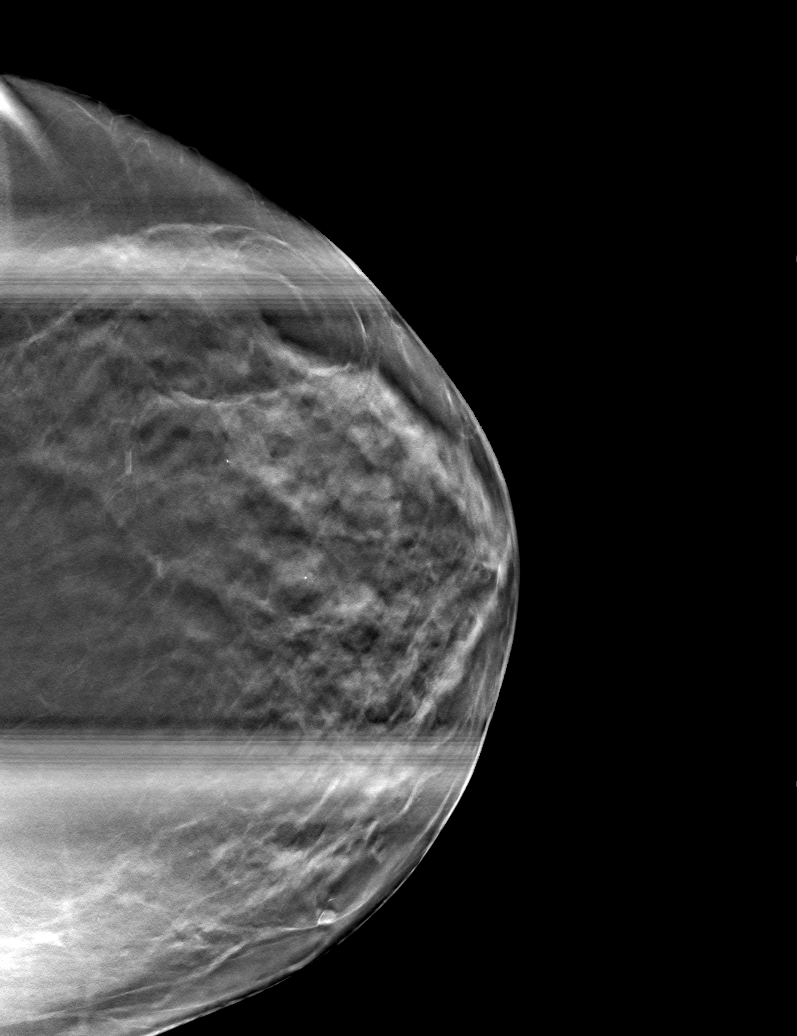

[L CC tomo (2 of 2) · tomo slice 31/62.0]
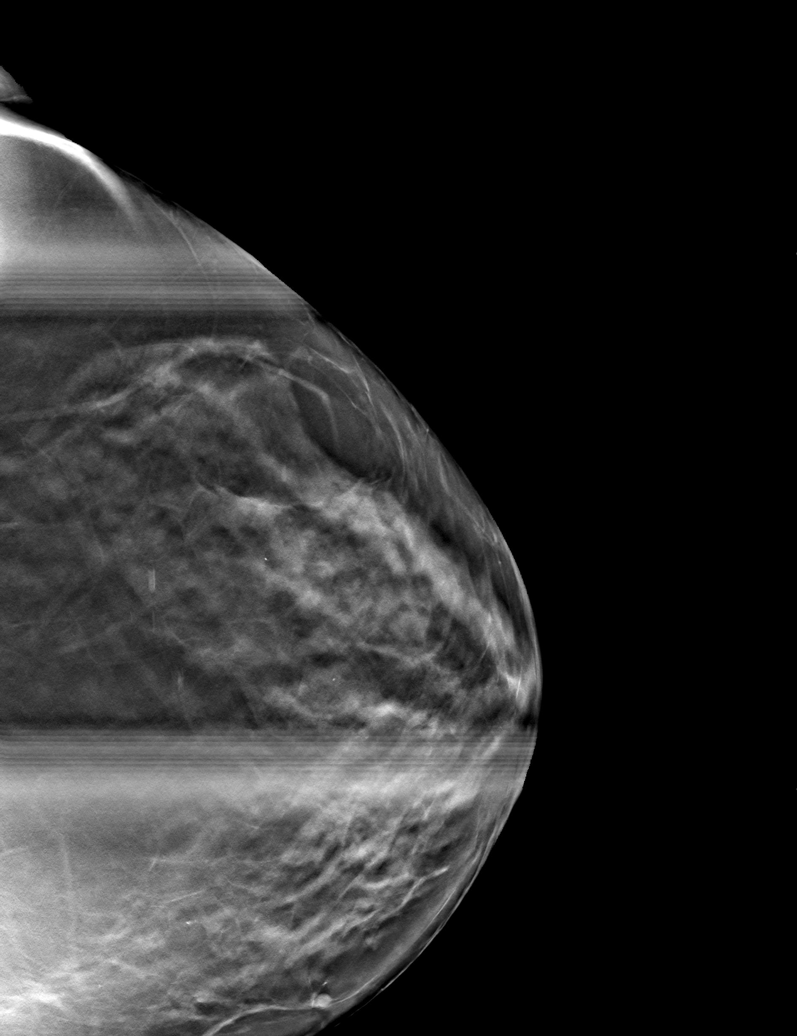

[L ML tomo · tomo slice 33/64.0]
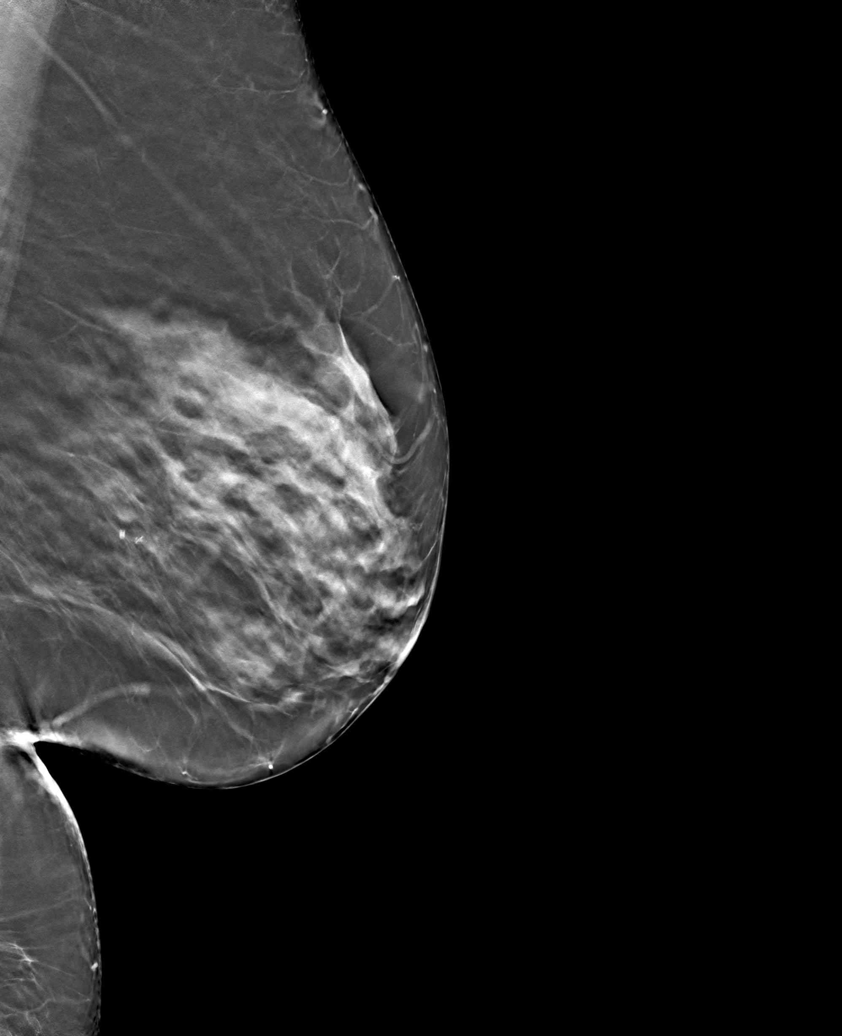

[6 of 18 positions shown; findings below may reference images not displayed]

ACR Breast Density Category c: The breast tissue is heterogeneously
dense, which may obscure small masses.
FINDINGS: Additional views of the left breast confirm a spiculated mass
containing a single internal calcification in the far outer left
breast above the nipple. The mass contains a single calcification.

Mammographic images were processed with CAD.

On physical exam, I palpate a subtle 1.0 cm mass in the 230-3
o'clock position of the left breast approximately 7 cm from the
nipple.

Targeted ultrasound is performed, showing an irregular hypoechoic
mass containing a single internal calcification at [DATE] to 3 o'clock
position 7 cm from the nipple. The mass measures 0.7 x 0.8 x 0.9 cm.
Vascular flow is seen adjacent to the mass.

Ultrasound of the left axilla is negative for lymphadenopathy.
IMPRESSION: Suspicious 0.9 cm mass in the 2:30-3 o'clock position of the left
breast.

RECOMMENDATION:
Ultrasound-guided core needle biopsy of the left breast mass is
recommended and will be scheduled for the patient. The procedure for
biopsy was discussed with the patient today.

I have discussed the findings and recommendations with the patient.
If applicable, a reminder letter will be sent to the patient
regarding the next appointment.

BI-RADS CATEGORY  5: Highly suggestive of malignancy.

## 2021-07-07 ENCOUNTER — Telehealth: Payer: Self-pay | Admitting: Podiatry

## 2021-07-07 NOTE — Telephone Encounter (Signed)
Orthotics in.. lvm for pt to call to schedule an appt to pick them up. °

## 2021-07-08 ENCOUNTER — Telehealth: Payer: Self-pay | Admitting: Podiatry

## 2021-07-08 NOTE — Telephone Encounter (Signed)
Pt left message stating she missed a call from our number yesterday..  I returned call and left message that I called to get her scheduled to pick up her orthotics.

## 2021-07-24 ENCOUNTER — Ambulatory Visit: Payer: BC Managed Care – PPO

## 2021-07-24 ENCOUNTER — Other Ambulatory Visit: Payer: Self-pay

## 2021-07-24 DIAGNOSIS — Q666 Other congenital valgus deformities of feet: Secondary | ICD-10-CM

## 2021-07-24 NOTE — Progress Notes (Signed)
SITUATION: Reason for Visit: Fitting and Delivery of Custom Fabricated Foot Orthoses Patient Report: Patient reports comfort and is satisfied with device.  OBJECTIVE DATA: Patient History / Diagnosis:     ICD-10-CM   1. Pes planovalgus  Q66.6       Provided Device:  Custom Functional Foot Orthotics     [facility]: [phone]  GOAL OF ORTHOSIS - Improve gait - Decrease energy expenditure - Improve Balance - Provide Triplanar stability of foot complex - Facilitate motion  ACTIONS PERFORMED Patient was fit with foot orthotics trimmed to shoe last. Patient tolerated fittign procedure.   Patient was provided with verbal and written instruction and demonstration regarding donning, doffing, wear, care, proper fit, function, purpose, cleaning, and use of the orthosis and in all related precautions and risks and benefits regarding the orthosis.  Patient was also provided with verbal instruction regarding how to report any failures or malfunctions of the orthosis and necessary follow up care. Patient was also instructed to contact our office regarding any change in status that may affect the function of the orthosis.  Patient demonstrated independence with proper donning, doffing, and fit and verbalized understanding of all instructions.  PLAN: Patient is to follow up in one week or as necessary (PRN). All questions were answered and concerns addressed. Plan of care was discussed with and agreed upon by the patient.

## 2021-08-19 ENCOUNTER — Ambulatory Visit
Admit: 2021-08-19 | Discharge: 2021-08-19 | Payer: PRIVATE HEALTH INSURANCE | Attending: Family | Primary: Family Medicine

## 2021-08-19 DIAGNOSIS — Z0000 Encounter for general adult medical examination without abnormal findings: Secondary | ICD-10-CM

## 2021-08-19 LAB — POCT URINALYSIS DIPSTICK W/O MICROSCOPE (AUTO)
Bilirubin, UA: NEGATIVE
Glucose, UA POC: NEGATIVE
Ketones, UA: NEGATIVE
Leukocytes, UA: NEGATIVE
Nitrite, UA: NEGATIVE
Protein, UA POC: NEGATIVE
Spec Grav, UA: >=1.03
Urobilinogen, UA: 0.2
pH, UA: 5.5

## 2021-08-19 MED ORDER — SIMVASTATIN 20 MG PO TABS
20 MG | ORAL_TABLET | Freq: Every evening | ORAL | 3 refills | Status: AC
Start: 2021-08-19 — End: ?

## 2021-08-19 MED ORDER — GABAPENTIN 300 MG PO CAPS
300 MG | ORAL_CAPSULE | Freq: Every evening | ORAL | 3 refills | Status: AC
Start: 2021-08-19 — End: 2022-08-14

## 2021-08-19 MED ORDER — LISINOPRIL-HYDROCHLOROTHIAZIDE 20-25 MG PO TABS
20-25 MG | ORAL_TABLET | Freq: Every day | ORAL | 3 refills | Status: AC
Start: 2021-08-19 — End: 2022-08-02

## 2021-08-19 NOTE — Patient Instructions (Signed)
Well Visit, Women 50 to 65: Care Instructions  Overview     Well visits can help you stay healthy. Your doctor has checked your overall health and may have suggested ways to take good care of yourself. Your doctor also may have recommended tests. At home, you can help prevent illness with healthy eating, regular exercise, and other steps.  Follow-up care is a key part of your treatment and safety. Be sure to make and go to all appointments, and call your doctor if you are having problems. It's also a good idea to know your test results and keep a list of the medicines you take.  How can you care for yourself at home?  Get screening tests that you and your doctor decide on. Screening helps find diseases before any symptoms appear.  Eat healthy foods. Choose fruits, vegetables, whole grains, protein, and low-fat dairy foods. Limit fat, especially saturated fat. Reduce salt in your diet.  Limit alcohol. Have no more than 1 drink a day or 7 drinks a week.  Get at least 30 minutes of exercise on most days of the week. Walking is a good choice. You also may want to do other activities, such as running, swimming, cycling, or playing tennis or team sports.  Reach and stay at a healthy weight. This will lower your risk for many problems, such as obesity, diabetes, heart disease, and high blood pressure.  Do not smoke. Smoking can make health problems worse. If you need help quitting, talk to your doctor about stop-smoking programs and medicines. These can increase your chances of quitting for good.  Care for your mental health. It is easy to get weighed down by worry and stress. Learn strategies to manage stress, like deep breathing and mindfulness, and stay connected with your family and community. If you find you often feel sad or hopeless, talk with your doctor. Treatment can help.  Talk to your doctor about whether you have any risk factors for sexually transmitted infections (STIs). You can help prevent STIs if you  wait to have sex with a new partner (or partners) until you've each been tested for STIs. It also helps if you use condoms (female or female condoms) and if you limit your sex partners to one person who only has sex with you. Vaccines are available for some STIs.  If you think you may have a problem with alcohol or drug use, talk to your doctor. This includes prescription medicines (such as amphetamines and opioids) and illegal drugs (such as cocaine and methamphetamine). Your doctor can help you figure out what type of treatment is best for you.  Protect your skin from too much sun. When you're outdoors from 10 a.m. to 4 p.m., stay in the shade or cover up with clothing and a hat with a wide brim. Wear sunglasses that block UV rays. Even when it's cloudy, put broad-spectrum sunscreen (SPF 30 or higher) on any exposed skin.  See a dentist one or two times a year for checkups and to have your teeth cleaned.  Wear a seat belt in the car.  When should you call for help?  Watch closely for changes in your health, and be sure to contact your doctor if you have any problems or symptoms that concern you.  Where can you learn more?  Go to [email or web address] and sign in to your MyChart account. Enter Y074 in the Search Health Information box to learn more about "Well Visit, Women 50 to 65: Care Instructions."       If you do not have an account, please click on the "Sign Up Now" link.  Current as of: November 10, 2020??????????????????????????????Content Version: 13.4  ?? 2006-2022 [employer].   Care instructions adapted under license by [facility]. If you have questions about a medical condition or this instruction, always ask your healthcare professional. Healthwise, Incorporated disclaims any warranty or liability for your use of this information.

## 2021-08-19 NOTE — Progress Notes (Signed)
Well Adult Note  Name: Hayley Stephens Today???s Date: 08/19/2021   MRN: [id number] Sex: Female   Age: 66 y.o. Ethnicity: Non-Hispanic / Non Latino   DOB: [birth year] Race: White (non-Hispanic)      Hayley Stephens is here for well adult exam.  History:    No issues or concerns.  Declines vaccines today.  Mammogram: 03/2020, will set up herself  Colonoscopy: cologuard 07/2018, negative      Review of Systems   Constitutional: Negative.    HENT: Negative.     Respiratory: Negative.  Negative for shortness of breath.    Cardiovascular: Negative.  Negative for chest pain.   Gastrointestinal: Negative.  Negative for constipation, diarrhea, nausea and vomiting.   Genitourinary: Negative.    Musculoskeletal: Negative.    Skin: Negative.  Negative for rash.   Neurological: Negative.  Negative for dizziness and headaches.     No Known Allergies      Prior to Visit Medications    Medication Sig Taking? Authorizing Provider   gabapentin (NEURONTIN) 300 MG capsule Take 1 capsule by mouth nightly for 360 days. Yes Belva Chimes, APRN - CNP   lisinopril-hydroCHLOROthiazide (PRINZIDE;ZESTORETIC) 20-25 MG per tablet Take 1 tablet by mouth daily Yes Belva Chimes, APRN - CNP   simvastatin (ZOCOR) 20 MG tablet Take 1 tablet by mouth nightly Yes Belva Chimes, APRN - CNP   ibuprofen (ADVIL;MOTRIN) 800 MG tablet Take 1 tablet by mouth every 8 hours as needed for Pain  Patient not taking: Reported on 08/19/2021  Avis Epley, MD         Past Medical History:   Diagnosis Date    Hypertension        Past Surgical History:   Procedure Laterality Date    BACK SURGERY  08/29/2019    [facility], no hardware    CARPAL TUNNEL RELEASE Left 04/2020    CARPAL TUNNEL RELEASE Right 2000    HIP SURGERY Right     HYSTERECTOMY (CERVIX STATUS UNKNOWN)  1982         Family History   Problem Relation Age of Onset    Diabetes Mother        Social History     Tobacco Use    Smoking status: Former     Packs/day: 0.50     Types: Cigarettes    Smokeless tobacco: Never    Substance Use Topics    Alcohol use: No    Drug use: No       Objective   BP 120/82    Resp 18    Ht 5' 0.5" (1.537 m)    Wt 171 lb (77.6 kg)    BMI 32.85 kg/m??   Wt Readings from Last 3 Encounters:   08/19/21 171 lb (77.6 kg)   08/13/20 168 lb (76.2 kg)   02/27/15 166 lb (75.3 kg)       Physical Exam  Vitals reviewed.   Constitutional:       General: She is not in acute distress.     Appearance: Normal appearance.   HENT:      Right Ear: Tympanic membrane normal.      Left Ear: Tympanic membrane normal.      Mouth/Throat:      Mouth: Mucous membranes are moist.      Pharynx: Oropharynx is clear.   Cardiovascular:      Rate and Rhythm: Normal rate and regular rhythm.      Heart sounds: No murmur  heard.  Pulmonary:      Effort: Pulmonary effort is normal.      Breath sounds: Normal breath sounds.   Abdominal:      General: Bowel sounds are normal. There is no distension.      Palpations: Abdomen is soft. There is no mass.      Tenderness: There is no abdominal tenderness.   Musculoskeletal:         General: Normal range of motion.      Cervical back: Neck supple.   Lymphadenopathy:      Cervical: No cervical adenopathy.   Skin:     General: Skin is warm and dry.   Neurological:      General: No focal deficit present.      Mental Status: She is alert.         Assessment   Plan   1. Wellness examination  -     POCT Urinalysis No Micro (Auto)  -     Basic Metabolic Panel; Future  -     CBC with Auto Differential; Future  -     Hepatic Function Panel; Future  -     Lipid, Fasting; Future  -     HIV Screen; Future  -     Hepatitis C Antibody; Future  2. Depression screen  -     PR DEPRESSION SCREEN ANNUAL  3. Osteoarthritis of lumbar spine, unspecified spinal osteoarthritis complication status  -     gabapentin (NEURONTIN) 300 MG capsule; Take 1 capsule by mouth nightly for 360 days., Disp-90 capsule, R-3Normal  4. Primary hypertension  -     lisinopril-hydroCHLOROthiazide (PRINZIDE;ZESTORETIC) 20-25 MG per tablet; Take  1 tablet by mouth daily, Disp-90 tablet, R-3Normal  5. Hypercholesterolemia  -     simvastatin (ZOCOR) 20 MG tablet; Take 1 tablet by mouth nightly, Disp-90 tablet, R-3Normal  6. Screen for colon cancer  -     Fecal DNA Colorectal cancer screening (Cologuard)         Personalized Preventive Plan   Current Health Maintenance Status  Immunization History   Administered Date(s) Administered    COVID-19, MODERNA BLUE border, Primary or Immunocompromised, (age 12y+), IM, 100 mcg/0.2mL 07/21/2019, 08/18/2019    COVID-19, MODERNA Booster BLUE border, (age 18y+), IM, 64mcg/0.25mL 04/11/2020    Pneumococcal Conjugate 13-valent (Prevnar13) 03/11/2014    Tdap (Boostrix, Adacel) 10/26/2012, 03/12/2013    Zoster Live (Zostavax) 03/10/2012        Health Maintenance   Topic Date Due    HIV screen  Never done    Hepatitis C screen  Never done    Diabetes screen  Never done    Shingles vaccine (2 of 3) 05/05/2012    COVID-19 Vaccine (4 - Booster for Moderna series) 06/06/2020    Flu vaccine (1) Never done    Lipids  03/15/2021    Colorectal Cancer Screen  07/31/2021    Breast cancer screen  04/04/2022    Depression Screen  08/20/2022    DTaP/Tdap/Td vaccine (3 - Td or Tdap) 03/13/2023    Hepatitis A vaccine  Aged Out    Hib vaccine  Aged Out    Meningococcal (ACWY) vaccine  Aged Out    Pneumococcal 0-64 years Vaccine  Aged Out     Recommendations for [employer] Due: Hayley orders and patient instructions/AVS.    Return in 1 year (on 08/20/2022) for Wellness.

## 2021-09-01 LAB — FECAL DNA COLORECTAL CANCER SCREENING (COLOGUARD): FIT-DNA (Cologuard): NEGATIVE

## 2021-09-01 NOTE — Other (Signed)
PT INFORMED

## 2021-09-07 ENCOUNTER — Ambulatory Visit
Admission: EM | Admit: 2021-09-07 | Discharge: 2021-09-07 | Disposition: A | Discharge status: Home / Self Care | Payer: BC Managed Care – PPO | Attending: Family Medicine | Admitting: Family Medicine

## 2021-09-07 ENCOUNTER — Ambulatory Visit (INDEPENDENT_AMBULATORY_CARE_PROVIDER_SITE_OTHER): Payer: BC Managed Care – PPO

## 2021-09-07 DIAGNOSIS — R911 Solitary pulmonary nodule: Secondary | ICD-10-CM

## 2021-09-07 DIAGNOSIS — S2242XD Multiple fractures of ribs, left side, subsequent encounter for fracture with routine healing: Secondary | ICD-10-CM | POA: Diagnosis not present

## 2021-09-07 DIAGNOSIS — S2242XA Multiple fractures of ribs, left side, initial encounter for closed fracture: Secondary | ICD-10-CM

## 2021-09-07 MED ORDER — CYCLOBENZAPRINE HCL 5 MG PO TABS: 5.0000 mg | ORAL_TABLET | Freq: Three times a day (TID) | ORAL | 0 refills | Status: DC | PRN

## 2021-09-07 MED ORDER — LIDOCAINE 5 % EX PTCH: 1.0000 | MEDICATED_PATCH | CUTANEOUS | 0 refills | Status: DC

## 2021-09-07 NOTE — ED Triage Notes (Signed)
Pt states that she fell 2 weeks ago against the corner of her porch on her left side in her rib cage area ? ?Pt states she went to the Urgent Care in [facility] and they did an Xray and nothing was broken ? ?Pt states the pain has made her entire chest sore ? ?Pt states she is still in pain on her left side under her breast and when she takes a deep breath it is hard to breath ?

## 2021-09-07 NOTE — Discharge Instructions (Signed)
Your x-ray today showed fractures to the fourth through sixth ribs on the left.  Keep an eye on your breathing and if you become short of breath at any time he should go to the emergency department or call 911 immediately.  Follow-up closely with your primary care provider both on this and to further evaluate right sided lung nodules found on x-ray today as well.  The radiologist is recommending a CT scan of your chest to obtain more information on this. ?

## 2021-09-07 NOTE — ED Provider Notes (Signed)
?[facility] ? ? ? ?CSN: [id number] ?Arrival date & time: 09/07/21  6578 ? ? ?  ? ?History   ?Chief Complaint ?Chief Complaint  ?Patient presents with  ? Fall  ?  Fell 2 weeks ago hitting left side and SOB  ? ? ?HPI ?Erica Giles is a 66 y.o. female.  ? ?Presenting today with 2-week history of left-sided rib soreness that has progressed since a fall 2 weeks ago where she hit the corner of her porch directly to this area.  She states it is just a sore today as it was when it first happened.  Denies any shortness of breath, difficulty breathing or wheezing, bruising or swelling to the area, dizziness, abdominal pain, nausea vomiting, bowel changes.  So far not trying anything over-the-counter for symptoms.  States she was seen in an urgent care in [place], x-rays were performed that were negative for fracture. ? ? ?Past Medical History:  ?Diagnosis Date  ? Anxiety   ? Arthritis   ? Breast cancer ([place])   ? left  ? CTS (carpal tunnel syndrome)   ? Depression   ? Diabetes mellitus   ? ORAL MED  ? Family history of breast cancer   ? GERD (gastroesophageal reflux disease)   ? Helicobacter pylori gastritis   ? Hiatal hernia   ? HNP (herniated nucleus pulposus), lumbar   ? PT TRYING TO AVOID LUMBAR SURGERY--STATES PAIN IN HER BACK AND SOMETIMES DOWN ONE OR THE OTHER LEG  ? Hypertension   ? Personal history of radiation therapy   ? Plantar fasciitis   ? BILATERAL  ? Schatzki's ring   ? Shortness of breath   ? WITH EXERTION-PT RELATES TO HER WEIGHT  ? Sleep apnea   ? MODERATE- PER SLEEP STUDY 06/2010--PT USES CPAP-SETTING IS 11  ? ? ?Patient Active Problem List  ? Diagnosis Date Noted  ? Neck pain 10/25/2019  ? DM type 2 with diabetic peripheral neuropathy ([place]) 10/25/2019  ? Genetic testing 07/12/2019  ? Family history of breast cancer   ? Breast cancer of upper-outer quadrant of left female breast ([place]) 03/06/2019  ? GERD (gastroesophageal reflux disease) 12/11/2013  ? Hemorrhoids 12/11/2013  ? DM2 (diabetes  mellitus, type 2) ([place]) 12/11/2013  ? DDD (degenerative disc disease) 12/11/2013  ? HLD (hyperlipidemia) 12/11/2013  ? S/P cholecystectomy 12/11/2013  ? ? ?Past Surgical History:  ?Procedure Laterality Date  ? ABDOMINAL HYSTERECTOMY    ? BREAST LUMPECTOMY    ? BREAST LUMPECTOMY WITH RADIOACTIVE SEED AND SENTINEL LYMPH NODE BIOPSY Left 03/27/2019  ? Procedure: LEFT BREAST LUMPECTOMY WITH RADIOACTIVE SEED AND LEFT AXILLARY DEEP SENTINEL LYMPH NODE BIOPSY INJECT BLUE DYE;  Surgeon: Fanny Skates, MD;  Location: [facility];  Service: General;  Laterality: Left;  ? CARPAL TUNNEL RELEASE    ? BILATERAL  ? CHOLECYSTECTOMY    ? CYST REMOVED    ? LEFT THUMB AND RT MIDDLE FINGER  ? ESOPHAGECTOMY    ? TONSILLECTOMY    ? TRIGGER FINGER REPAIR    ? ? ?OB History   ?No obstetric history on file. ?  ? ? ? ?Home Medications   ? ?Prior to Admission medications   ?Medication Sig Start Date End Date Taking? Authorizing Provider  ?cyclobenzaprine (FLEXERIL) 5 MG tablet Take 1 tablet (5 mg total) by mouth 3 (three) times daily as needed for muscle spasms. Do not drink alcohol or drive while taking this medication.  May cause drowsiness. 09/07/21  Yes Merrie Roof  Elizabeth, PA-C  ?lidocaine (LIDODERM) 5 % Place 1 patch onto the skin daily. Remove & Discard patch within 12 hours or as directed by MD 09/07/21  Yes Volney American, PA-C  ?alendronate (FOSAMAX) 35 MG tablet Take 1 tablet (35 mg total) by mouth every 7 (seven) days. Take with a full glass of water on an empty stomach. 05/14/21   Harriett Rush, PA-C  ?anastrozole (ARIMIDEX) 1 MG tablet TAKE ONE TABLET BY MOUTH DAILY 02/23/21   Derek Jack, MD  ?atorvastatin (LIPITOR) 80 MG tablet Take 80 mg by mouth daily. 03/30/20   [provider]  ?cholecalciferol (VITAMIN D3) 25 MCG (1000 UT) tablet Take 2,000-4,000 Units by mouth See admin instructions. 2000 mcg daily, except once a week, take 4000 mcg daily    [provider]  ?cyanocobalamin (,VITAMIN  B-12,) 1000 MCG/ML injection Inject 1,000 mcg into the muscle every 30 (thirty) days.  02/17/19   [provider]  ?DULoxetine (CYMBALTA) 30 MG capsule duloxetine 30 mg capsule,delayed release ? TAKE THREE (3) CAPSULES BY MOUTH ONCE DAILY    [provider]  ?Empagliflozin-metFORMIN HCl ER (SYNJARDY XR) 12.10-998 MG TB24 Take 2 tablets by mouth every morning.     [provider]  ?gabapentin (NEURONTIN) 300 MG capsule Take 900 mg by mouth 3 (three) times daily as needed (pain).     [provider]  ?ibuprofen (ADVIL) 800 MG tablet as needed.     [provider]  ?insulin NPH-regular Human (NOVOLIN 70/30) (70-30) 100 UNIT/ML injection     [provider]  ?lisinopril (PRINIVIL,ZESTRIL) 20 MG tablet Take 20 mg by mouth at bedtime.    [provider]  ?pantoprazole (PROTONIX) 40 MG tablet Take 1 tablet (40 mg total) by mouth 2 (two) times daily. ?Patient taking differently: Take 40 mg by mouth daily. 02/22/14   Pyrtle, Lajuan Lines, MD  ?SYRINGE-NEEDLE, DISP, 3 ML (B-D 3CC LUER-LOK SYR 25GX5/8") 25G X 5/8" 3 ML MISC BD Luer-Lok Syringe 3 mL 25 x 5/8" ? USE TO INJECT VITAMIN B12 ONCE A MONTH.    [provider]  ?TRULICITY 1.5 AJ/6.8TL SOPN Inject into the skin. 05/13/21   [provider]  ? ? ?Family History ?Family History  ?Problem Relation Age of Onset  ? Diabetes Mother   ? Kidney disease Mother   ? Dementia Mother   ? Diabetes Father   ? Kidney Stones Sister   ? Diabetes Brother   ? Kidney Stones Brother   ? Scoliosis Son   ? Other Daughter   ?     infanct death  ? Kidney cancer Maternal Aunt 80  ? Breast cancer Paternal Aunt 27  ? Other Sister 14  ?     murdered  ? Other Sister 7  ?     murdered  ? Other Brother   ?     infant death  ? Breast cancer Maternal Aunt   ?     dx over 2  ? Breast cancer Paternal Aunt 35  ? Breast cancer Paternal Aunt   ?     dx over 68  ? ? ?Social History ?Social History  ? ?Tobacco Use  ? Smoking status: Former  ?   Packs/day: 1.00  ?  Years: 25.00  ?  Pack years: 25.00  ?  Types: Cigarettes  ?  Quit date: 03/05/2008  ?  Years since quitting: 13.5  ? Smokeless tobacco: Never  ? Tobacco comments:  ?  03/2008  ?  Vaping Use  ? Vaping Use: Never used  ?Substance Use Topics  ? Alcohol use: No  ? Drug use: No  ? ? ? ?Allergies   ?Celecoxib, Exenatide, Percocet [oxycodone-acetaminophen], Acetaminophen, Hydrocodone, Oxycodone hcl, and Oxycodone-acetaminophen ? ? ?Review of Systems ?Review of Systems ?Per HPI ? ?Physical Exam ?Triage Vital Signs ?ED Triage Vitals  ?Enc Vitals Group  ?   BP 09/07/21 0946 130/75  ?   Pulse Rate 09/07/21 0946 64  ?   Resp 09/07/21 0946 16  ?   Temp 09/07/21 0946 98.4 ?F (36.9 ?C)  ?   Temp Source 09/07/21 0946 Oral  ?   SpO2 09/07/21 0946 98 %  ?   Weight --   ?   Height --   ?   Head Circumference --   ?   Peak Flow --   ?   Pain Score 09/07/21 0943 8  ?   Pain Loc --   ?   Pain Edu? --   ?   Excl. in [place]? --   ? ?No data found. ? ?Updated Vital Signs ?BP 130/75 (BP Location: Right Arm)   Pulse 64   Temp 98.4 ?F (36.9 ?C) (Oral)   Resp 16   LMP  (LMP Unknown)   SpO2 98%  ? ?Visual Acuity ?Right Eye Distance:   ?Left Eye Distance:   ?Bilateral Distance:   ? ?Right Eye Near:   ?Left Eye Near:    ?Bilateral Near:    ? ?Physical Exam ?Vitals and nursing note reviewed.  ?Constitutional:   ?   Appearance: Normal appearance. She is not ill-appearing.  ?HENT:  ?   Head: Atraumatic.  ?   Mouth/Throat:  ?   Mouth: Mucous membranes are moist.  ?   Pharynx: Oropharynx is clear.  ?Eyes:  ?   Extraocular Movements: Extraocular movements intact.  ?   Conjunctiva/sclera: Conjunctivae normal.  ?Cardiovascular:  ?   Rate and Rhythm: Normal rate and regular rhythm.  ?   Heart sounds: Normal heart sounds.  ?Pulmonary:  ?   Effort: Pulmonary effort is normal.  ?   Breath sounds: Normal breath sounds. No wheezing or rales.  ?   Comments: Chest rise symmetric bilaterally.  Breath sounds full and equal bilaterally ?Chest:  ?    Chest wall: Tenderness present.  ?Musculoskeletal:     ?   General: Tenderness and signs of injury present. No swelling or deformity. Normal range of motion.  ?   Cervical back: Normal range of motion and ne

## 2021-09-09 ENCOUNTER — Other Ambulatory Visit (HOSPITAL_COMMUNITY): Payer: Self-pay | Admitting: General Practice

## 2021-09-09 ENCOUNTER — Other Ambulatory Visit: Payer: Self-pay | Admitting: General Practice

## 2021-09-09 DIAGNOSIS — R918 Other nonspecific abnormal finding of lung field: Secondary | ICD-10-CM

## 2021-09-09 DIAGNOSIS — Z853 Personal history of malignant neoplasm of breast: Secondary | ICD-10-CM

## 2021-09-10 ENCOUNTER — Ambulatory Visit (HOSPITAL_COMMUNITY)
Admission: RE | Admit: 2021-09-10 | Discharge: 2021-09-10 | Disposition: A | Discharge status: Home / Self Care | Payer: BC Managed Care – PPO | Source: Ambulatory Visit | Attending: General Practice | Admitting: General Practice

## 2021-09-10 DIAGNOSIS — R918 Other nonspecific abnormal finding of lung field: Secondary | ICD-10-CM | POA: Diagnosis present

## 2021-09-10 DIAGNOSIS — Z853 Personal history of malignant neoplasm of breast: Secondary | ICD-10-CM | POA: Diagnosis present

## 2021-09-10 LAB — POCT I-STAT CREATININE: Creatinine, Ser: 0.9 mg/dL (ref 0.44–1.00)

## 2021-09-10 MED ORDER — IOHEXOL 300 MG/ML  SOLN
75.0000 mL | Freq: Once | INTRAMUSCULAR | Status: AC | PRN
  Administered 2021-09-10: 75 mL via INTRAVENOUS

## 2021-09-16 DIAGNOSIS — R918 Other nonspecific abnormal finding of lung field: Secondary | ICD-10-CM | POA: Insufficient documentation

## 2021-09-17 ENCOUNTER — Inpatient Hospital Stay (HOSPITAL_COMMUNITY): Payer: BC Managed Care – PPO | Attending: Hematology | Admitting: Hematology

## 2021-09-17 ENCOUNTER — Inpatient Hospital Stay (HOSPITAL_COMMUNITY): Payer: BC Managed Care – PPO

## 2021-09-17 DIAGNOSIS — Z79899 Other long term (current) drug therapy: Secondary | ICD-10-CM | POA: Insufficient documentation

## 2021-09-17 DIAGNOSIS — Z170 Estrogen receptor positive status [ER+]: Secondary | ICD-10-CM | POA: Diagnosis not present

## 2021-09-17 DIAGNOSIS — Z79811 Long term (current) use of aromatase inhibitors: Secondary | ICD-10-CM | POA: Insufficient documentation

## 2021-09-17 DIAGNOSIS — Z803 Family history of malignant neoplasm of breast: Secondary | ICD-10-CM | POA: Insufficient documentation

## 2021-09-17 DIAGNOSIS — Z923 Personal history of irradiation: Secondary | ICD-10-CM

## 2021-09-17 DIAGNOSIS — M8580 Other specified disorders of bone density and structure, unspecified site: Secondary | ICD-10-CM | POA: Diagnosis not present

## 2021-09-17 DIAGNOSIS — C50912 Malignant neoplasm of unspecified site of left female breast: Secondary | ICD-10-CM | POA: Insufficient documentation

## 2021-09-17 DIAGNOSIS — E1140 Type 2 diabetes mellitus with diabetic neuropathy, unspecified: Secondary | ICD-10-CM | POA: Diagnosis not present

## 2021-09-17 DIAGNOSIS — R918 Other nonspecific abnormal finding of lung field: Secondary | ICD-10-CM | POA: Insufficient documentation

## 2021-09-17 DIAGNOSIS — Z87891 Personal history of nicotine dependence: Secondary | ICD-10-CM | POA: Diagnosis not present

## 2021-09-17 LAB — VITAMIN D 25 HYDROXY (VIT D DEFICIENCY, FRACTURES): Vit D, 25-Hydroxy: 30.11 ng/mL (ref 30–100)

## 2021-09-17 NOTE — Progress Notes (Signed)
? ?[facility] ?[address] ?[place], [place] [address] ? ? ?CLINIC:  ?Medical Oncology/Hematology ? ?CONSULT NOTE ? ?Patient Care Team: ?Practice, [facility] as PCP - General ?Derek Jack, MD as Medical Oncologist (Medical Oncology) ?Brien Mates, RN as Oncology Nurse Navigator (Medical Oncology) ? ?CHIEF COMPLAINTS/PURPOSE OF CONSULTATION:  ?Evaluation of lung nodules in a patient with history of breast cancer ? ?HISTORY OF PRESENTING ILLNESS:  ?[name] 66 y.o. female is here because of evaluation of lung nodules, at the request of [facility]. She was last seen at the clinic for her diagnosis of left breast cancer on 04/01/2020. ? ?Today she reports feeling good. She fell on 3/18 while picking up a heavy golf cart charger; she fractured 3 left ribs. She felt severe pain upon sneezing, and a CT scan was done on 4/6. The rib pain is improving but is still worsened with sneezing. She denies infections or antibiotic use in the past 6 months. She denies unintentional weight loss. She is taking anastrozole and tolerating it well. She was started on 35 mg of Fosamax 1 month ago. She is taking calcium and vitamin D. She denies hot flashes and joint pains. She reports neuropathy in her feet and hands due to her history of DM; she is taking 100 mg Gabapentin TID. She reports soreness under her left arm at the site of the lymph node removal.   ? ?Three paternal aunts had breast cancer, and a maternal aunt had lupus. She quit smoking in 2009 after smoking 1 ppd for 20 years. She worked at [employer], and she reports possible chemical exposure.  ? ?MEDICAL HISTORY:  ?Past Medical History:  ?Diagnosis Date  ? Anxiety   ? Arthritis   ? Breast cancer ([place])   ? left  ? CTS (carpal tunnel syndrome)   ? Depression   ? Diabetes mellitus   ? ORAL MED  ? Family history of breast cancer   ? GERD (gastroesophageal reflux disease)   ? Helicobacter pylori gastritis   ? Hiatal hernia   ? HNP  (herniated nucleus pulposus), lumbar   ? PT TRYING TO AVOID LUMBAR SURGERY--STATES PAIN IN HER BACK AND SOMETIMES DOWN ONE OR THE OTHER LEG  ? Hypertension   ? Personal history of radiation therapy   ? Plantar fasciitis   ? BILATERAL  ? Schatzki's ring   ? Shortness of breath   ? WITH EXERTION-PT RELATES TO HER WEIGHT  ? Sleep apnea   ? MODERATE- PER SLEEP STUDY 06/2010--PT USES CPAP-SETTING IS 11  ? ? ?SURGICAL HISTORY: ?Past Surgical History:  ?Procedure Laterality Date  ? ABDOMINAL HYSTERECTOMY    ? BREAST LUMPECTOMY    ? BREAST LUMPECTOMY WITH RADIOACTIVE SEED AND SENTINEL LYMPH NODE BIOPSY Left 03/27/2019  ? Procedure: LEFT BREAST LUMPECTOMY WITH RADIOACTIVE SEED AND LEFT AXILLARY DEEP SENTINEL LYMPH NODE BIOPSY INJECT BLUE DYE;  Surgeon: Fanny Skates, MD;  Location: [facility];  Service: General;  Laterality: Left;  ? CARPAL TUNNEL RELEASE    ? BILATERAL  ? CHOLECYSTECTOMY    ? CYST REMOVED    ? LEFT THUMB AND RT MIDDLE FINGER  ? ESOPHAGECTOMY    ? TONSILLECTOMY    ? TRIGGER FINGER REPAIR    ? ? ?SOCIAL HISTORY: ?Social History  ? ?Socioeconomic History  ? Marital status: Married  ?  Spouse name: Not on file  ? Number of children: 2  ? Years of education: GED  ? Highest education level: Not  on file  ?Occupational History  ? Occupation: Health visitor  ?  Employer: [employer]  ?Tobacco Use  ? Smoking status: Former  ?  Packs/day: 1.00  ?  Years: 25.00  ?  Pack years: 25.00  ?  Types: Cigarettes  ?  Quit date: 03/05/2008  ?  Years since quitting: 13.5  ? Smokeless tobacco: Never  ? Tobacco comments:  ?  03/2008  ?Vaping Use  ? Vaping Use: Never used  ?Substance and Sexual Activity  ? Alcohol use: No  ? Drug use: No  ? Sexual activity: Not on file  ?  Comment: hysterectomy  ?Other Topics Concern  ? Not on file  ?Social History Narrative  ? Lives at home with her husband.  ? Right-handed.  ? Caffeine use: 20 ounces per day.  ? ?Social Determinants of Health  ? ?Financial Resource Strain: Not on file  ?Food  Insecurity: Not on file  ?Transportation Needs: Not on file  ?Physical Activity: Not on file  ?Stress: Not on file  ?Social Connections: Not on file  ?Intimate Partner Violence: Not on file  ? ? ?FAMILY HISTORY: ?Family History  ?Problem Relation Age of Onset  ? Diabetes Mother   ? Kidney disease Mother   ? Dementia Mother   ? Diabetes Father   ? Kidney Stones Sister   ? Diabetes Brother   ? Kidney Stones Brother   ? Scoliosis Son   ? Other Daughter   ?     infanct death  ? Kidney cancer Maternal Aunt 80  ? Breast cancer Paternal Aunt 40  ? Other Sister 42  ?     murdered  ? Other Sister 7  ?     murdered  ? Other Brother   ?     infant death  ? Breast cancer Maternal Aunt   ?     dx over 85  ? Breast cancer Paternal Aunt 28  ? Breast cancer Paternal Aunt   ?     dx over 25  ? ? ?ALLERGIES:  ?is allergic to celecoxib, exenatide, percocet [oxycodone-acetaminophen], acetaminophen, hydrocodone, oxycodone hcl, and oxycodone-acetaminophen. ? ?MEDICATIONS:  ?Current Outpatient Medications  ?Medication Sig Dispense Refill  ? alendronate (FOSAMAX) 35 MG tablet Take 1 tablet (35 mg total) by mouth every 7 (seven) days. Take with a full glass of water on an empty stomach. 5 tablet 6  ? anastrozole (ARIMIDEX) 1 MG tablet TAKE ONE TABLET BY MOUTH DAILY 30 tablet 6  ? atorvastatin (LIPITOR) 80 MG tablet Take 80 mg by mouth daily.    ? Blood Glucose Calibration (CONTOUR NEXT CONTROL) Normal SOLN as directed per package instructions In Vitro    ? Blood Glucose Monitoring Suppl (CONTOUR NEXT MONITOR) w/Device KIT as directed per package instructions twice a day    ? cholecalciferol (VITAMIN D3) 25 MCG (1000 UT) tablet Take 2,000-4,000 Units by mouth See admin instructions. 2000 mcg daily, except once a week, take 4000 mcg daily    ? cyanocobalamin (,VITAMIN B-12,) 1000 MCG/ML injection Inject 1,000 mcg into the muscle every 30 (thirty) days.     ? cyclobenzaprine (FLEXERIL) 5 MG tablet Take 1 tablet (5 mg total) by mouth 3 (three)  times daily as needed for muscle spasms. Do not drink alcohol or drive while taking this medication.  May cause drowsiness. 15 tablet 0  ? DULoxetine (CYMBALTA) 30 MG capsule duloxetine 30 mg capsule,delayed release ? TAKE THREE (3) CAPSULES BY MOUTH ONCE DAILY    ?  Empagliflozin-metFORMIN HCl ER (SYNJARDY XR) 12.10-998 MG TB24 Take 2 tablets by mouth every morning.     ? gabapentin (NEURONTIN) 300 MG capsule Take 900 mg by mouth 3 (three) times daily as needed (pain).     ? glucose blood (CONTOUR NEXT TEST) test strip as directed per package instructions In Vitro twice a day    ? hydrOXYzine (ATARAX) 10 MG tablet hydroxyzine HCl 10 mg tablet ? TAKE ONE TABLET BY MOUTH EVERY 8 HOURS AS NEEDED FOR ANXIETY    ? ibuprofen (ADVIL) 800 MG tablet as needed.     ? insulin NPH Human (NOVOLIN N) 100 UNIT/ML injection (70/30) 94 Units Subcutaneous 30 min prior to breakfast and evening meal    ? insulin NPH-regular Human (NOVOLIN 70/30) (70-30) 100 UNIT/ML injection     ? lidocaine (LIDODERM) 5 % Place 1 patch onto the skin daily. Remove & Discard patch within 12 hours or as directed by MD 30 patch 0  ? lisinopril (PRINIVIL,ZESTRIL) 20 MG tablet Take 20 mg by mouth at bedtime.    ? pantoprazole (PROTONIX) 40 MG tablet Take 1 tablet (40 mg total) by mouth 2 (two) times daily. (Patient taking differently: Take 40 mg by mouth daily.) 60 tablet 11  ? SYRINGE-NEEDLE, DISP, 3 ML (B-D 3CC LUER-LOK SYR 25GX5/8") 25G X 5/8" 3 ML MISC BD Luer-Lok Syringe 3 mL 25 x 5/8" ? USE TO INJECT VITAMIN B12 ONCE A MONTH.    ? TRULICITY 1.5 FM/7.3UY SOPN Inject into the skin.    ? ?No current facility-administered medications for this visit.  ? ? ?REVIEW OF SYSTEMS:   ?Review of Systems  ?Constitutional:  Negative for appetite change, fatigue and unexpected weight change.  ?Cardiovascular:  Positive for chest pain.  ?Musculoskeletal:  Positive for arthralgias (6/10 feet).  ?Neurological:  Positive for numbness.  ?Psychiatric/Behavioral:  Positive  for depression. The patient is nervous/anxious.   ?All other systems reviewed and are negative. ? ? ?PHYSICAL EXAMINATION: ?ECOG PERFORMANCE STATUS: 0 - Asymptomatic ? ?There were no vitals filed for this visit. ?

## 2021-09-17 NOTE — Patient Instructions (Addendum)
[facility] at [facility] ?Discharge Instructions ? ?You were seen and examined today by Dr. Delton Coombes. Dr. Delton Coombes is a medical oncologist, meaning that he specializes in the treatment of cancer diagnoses. Dr. Delton Coombes discussed your past medical history, family history of cancers, and the events that led to you being here today. ? ?You were seen today due to an abnormal CT scan that showed concerning pulmonary nodules. That could be suspicious for cancer. Dr. Delton Coombes has recommended a PET scan. A PET scan is a specialized CT scan that illuminates where there is cancer present in your body. ? ?Dr. Delton Coombes has also recommended checking breast cancer tumor markers. These are not diagnostic but may be elevated if the pulmonary nodules are related to your previous diagnosis of breast cancer. ? ?Follow-up with Dr. Delton Coombes as scheduled following the PET scan. ? ? ?Thank you for choosing [facility] at [facility] to provide your oncology and hematology care.  To afford each patient quality time with our provider, please arrive at least 15 minutes before your scheduled appointment time.  ? ?If you have a lab appointment with the [facility] please come in thru the Main Entrance and check in at the main information desk. ? ?You need to re-schedule your appointment should you arrive 10 or more minutes late.  We strive to give you quality time with our providers, and arriving late affects you and other patients whose appointments are after yours.  Also, if you no show three or more times for appointments you may be dismissed from the clinic at the providers discretion.     ?Again, thank you for choosing [facility].  Our hope is that these requests will decrease the amount of time that you wait before being seen by our physicians.       ?_____________________________________________________________ ? ?Should you have questions after your visit to  [facility], please contact our office at [phone] and follow the prompts.  Our office hours are 8:00 a.m. and 4:30 p.m. Monday - Friday.  Please note that voicemails left after 4:00 p.m. may not be returned until the following business day.  We are closed weekends and major holidays.  You do have access to a nurse 24-7, just call the main number to the clinic [phone] and do not press any options, hold on the line and a nurse will answer the phone.   ? ?For prescription refill requests, have your pharmacy contact our office and allow 72 hours.   ? ?Due to Covid, you will need to wear a mask upon entering the hospital. If you do not have a mask, a mask will be given to you at the Main Entrance upon arrival. For doctor visits, patients may have 1 support person age 21 or older with them. For treatment visits, patients can not have anyone with them due to social distancing guidelines and our immunocompromised population.  ? ? ? ?

## 2021-09-18 LAB — CANCER ANTIGEN 15-3: CA 15-3: 24.3 U/mL (ref 0.0–25.0)

## 2021-09-18 LAB — CANCER ANTIGEN 27.29: CA 27.29: 23.9 U/mL (ref 0.0–38.6)

## 2021-09-25 ENCOUNTER — Other Ambulatory Visit (HOSPITAL_COMMUNITY): Payer: Self-pay | Admitting: Hematology

## 2021-09-25 DIAGNOSIS — C50412 Malignant neoplasm of upper-outer quadrant of left female breast: Secondary | ICD-10-CM

## 2021-10-01 ENCOUNTER — Ambulatory Visit (HOSPITAL_COMMUNITY)
Admission: RE | Admit: 2021-10-01 | Discharge: 2021-10-01 | Disposition: A | Discharge status: Home / Self Care | Payer: BC Managed Care – PPO | Source: Ambulatory Visit | Attending: Hematology | Admitting: Hematology

## 2021-10-01 DIAGNOSIS — R918 Other nonspecific abnormal finding of lung field: Secondary | ICD-10-CM | POA: Diagnosis present

## 2021-10-01 MED ORDER — FLUDEOXYGLUCOSE F - 18 (FDG) INJECTION
11.2100 | Freq: Once | INTRAVENOUS | Status: AC | PRN
Start: 2021-10-01 — End: 2021-10-01
  Administered 2021-10-01: 11.21 via INTRAVENOUS

## 2021-10-08 ENCOUNTER — Other Ambulatory Visit (HOSPITAL_COMMUNITY): Payer: Self-pay | Admitting: Hematology

## 2021-10-08 ENCOUNTER — Inpatient Hospital Stay (HOSPITAL_COMMUNITY): Payer: BC Managed Care – PPO | Attending: Hematology | Admitting: Hematology

## 2021-10-08 VITALS — BP 133/59 | HR 72 | Temp 97.5°F | Resp 18 | Ht 65.59 in | Wt 210.5 lb

## 2021-10-08 DIAGNOSIS — Z79899 Other long term (current) drug therapy: Secondary | ICD-10-CM | POA: Diagnosis not present

## 2021-10-08 DIAGNOSIS — M8580 Other specified disorders of bone density and structure, unspecified site: Secondary | ICD-10-CM | POA: Diagnosis not present

## 2021-10-08 DIAGNOSIS — C50412 Malignant neoplasm of upper-outer quadrant of left female breast: Secondary | ICD-10-CM | POA: Diagnosis not present

## 2021-10-08 DIAGNOSIS — Z923 Personal history of irradiation: Secondary | ICD-10-CM | POA: Insufficient documentation

## 2021-10-08 DIAGNOSIS — C773 Secondary and unspecified malignant neoplasm of axilla and upper limb lymph nodes: Secondary | ICD-10-CM | POA: Insufficient documentation

## 2021-10-08 DIAGNOSIS — R918 Other nonspecific abnormal finding of lung field: Secondary | ICD-10-CM | POA: Diagnosis not present

## 2021-10-08 DIAGNOSIS — Z170 Estrogen receptor positive status [ER+]: Secondary | ICD-10-CM

## 2021-10-08 DIAGNOSIS — Z87891 Personal history of nicotine dependence: Secondary | ICD-10-CM | POA: Insufficient documentation

## 2021-10-08 NOTE — Progress Notes (Signed)
? ?[facility] ?[address] ?[place], [place] [address] ? ? ?CLINIC:  ?Medical Oncology/Hematology ? ?PCP:  ?Practice, [facility] ?[place] / [place] [address] ?[phone] ? ? ?REASON FOR VISIT:  ?Follow-up for lung nodules with history of breast cancer ? ?PRIOR THERAPY:  ?Left lumpectomy on 03/27/2019 ?XRT completed on 07/18/2019 ? ?NGS Results: not done ? ?CURRENT THERAPY: anastrozole ? ?BRIEF ONCOLOGIC HISTORY:  ?Oncology History  ?Breast cancer of upper-outer quadrant of left female breast (Erica Giles)  ?03/06/2019 Initial Diagnosis  ? Breast cancer of upper-outer quadrant of left female breast (Erica Giles) ? ?  ?04/24/2019 Cancer Staging  ? Staging form: Breast, AJCC 8th Edition ?- Clinical: Stage IB (cT1c, cN1, cM0, G2, ER+, PR+, HER2-) - Signed by Derek Jack, MD on 04/24/2019 ? ?  ?07/11/2019 Genetic Testing  ? Negative genetic testing on the common hereditary cancer panel.  The Common Hereditary Gene Panel offered by [facility] includes sequencing and/or deletion duplication testing of the following 48 genes: APC, ATM, AXIN2, BARD1, BMPR1A, BRCA1, BRCA2, BRIP1, CDH1, CDK4, CDKN2A (p14ARF), CDKN2A (p16INK4a), CHEK2, CTNNA1, DICER1, EPCAM (Deletion/duplication testing only), GREM1 (promoter region deletion/duplication testing only), KIT, MEN1, MLH1, MSH2, MSH3, MSH6, MUTYH, NBN, NF1, NHTL1, PALB2, PDGFRA, PMS2, POLD1, POLE, PTEN, RAD50, RAD51C, RAD51D, RNF43, SDHB, SDHC, SDHD, SMAD4, SMARCA4. STK11, TP53, TSC1, TSC2, and VHL.  The following genes were evaluated for sequence changes only: SDHA and HOXB13 c.251G>A variant only. The report date is July 11, 2019. ?  ? ? ?CANCER STAGING: ?Cancer Staging  ?Breast cancer of upper-outer quadrant of left female breast (Erica Giles) ?Staging form: Breast, AJCC 8th Edition ?- Clinical: Stage IB (cT1c, cN1, cM0, G2, ER+, PR+, HER2-) - Signed by Derek Jack, MD on 04/24/2019 ? ? ?INTERVAL HISTORY:  ?Erica Giles, a 66 y.o. female, returns for routine  follow-up of her lung nodules with history of breast cancer. Erica Giles was last seen on 09/17/2021.  ? ?Today she reports feeling good. She reports occasional aching in her chest. ? ?REVIEW OF SYSTEMS:  ?Review of Systems  ?Constitutional:  Negative for appetite change and fatigue.  ?Neurological:  Positive for numbness.  ?All other systems reviewed and are negative. ? ?PAST MEDICAL/SURGICAL HISTORY:  ?Past Medical History:  ?Diagnosis Date  ? Anxiety   ? Arthritis   ? Breast cancer ([place])   ? left  ? CTS (carpal tunnel syndrome)   ? Depression   ? Diabetes mellitus   ? ORAL MED  ? Family history of breast cancer   ? GERD (gastroesophageal reflux disease)   ? Helicobacter pylori gastritis   ? Hiatal hernia   ? HNP (herniated nucleus pulposus), lumbar   ? PT TRYING TO AVOID LUMBAR SURGERY--STATES PAIN IN HER BACK AND SOMETIMES DOWN ONE OR THE OTHER LEG  ? Hypertension   ? Personal history of radiation therapy   ? Plantar fasciitis   ? BILATERAL  ? Schatzki's ring   ? Shortness of breath   ? WITH EXERTION-PT RELATES TO HER WEIGHT  ? Sleep apnea   ? MODERATE- PER SLEEP STUDY 06/2010--PT USES CPAP-SETTING IS 11  ? ?Past Surgical History:  ?Procedure Laterality Date  ? ABDOMINAL HYSTERECTOMY    ? BREAST LUMPECTOMY    ? BREAST LUMPECTOMY WITH RADIOACTIVE SEED AND SENTINEL LYMPH NODE BIOPSY Left 03/27/2019  ? Procedure: LEFT BREAST LUMPECTOMY WITH RADIOACTIVE SEED AND LEFT AXILLARY DEEP SENTINEL LYMPH NODE BIOPSY INJECT BLUE DYE;  Surgeon: Fanny Skates, MD;  Location: [facility];  Service: General;  Laterality: Left;  ? CARPAL TUNNEL RELEASE    ? BILATERAL  ? CHOLECYSTECTOMY    ? CYST REMOVED    ? LEFT THUMB AND RT MIDDLE FINGER  ? ESOPHAGECTOMY    ? TONSILLECTOMY    ? TRIGGER FINGER REPAIR    ? ? ?SOCIAL HISTORY:  ?Social History  ? ?Socioeconomic History  ? Marital status: Married  ?  Spouse name: Not on file  ? Number of children: 2  ? Years of education: GED  ? Highest education level: Not on file  ?Occupational History  ?  Occupation: Health visitor  ?  Employer: [employer]  ?Tobacco Use  ? Smoking status: Former  ?  Packs/day: 1.00  ?  Years: 25.00  ?  Pack years: 25.00  ?  Types: Cigarettes  ?  Quit date: 03/05/2008  ?  Years since quitting: 13.6  ? Smokeless tobacco: Never  ? Tobacco comments:  ?  03/2008  ?Vaping Use  ? Vaping Use: Never used  ?Substance and Sexual Activity  ? Alcohol use: No  ? Drug use: No  ? Sexual activity: Not on file  ?  Comment: hysterectomy  ?Other Topics Concern  ? Not on file  ?Social History Narrative  ? Lives at home with her husband.  ? Right-handed.  ? Caffeine use: 20 ounces per day.  ? ?Social Determinants of Health  ? ?Financial Resource Strain: Not on file  ?Food Insecurity: Not on file  ?Transportation Needs: Not on file  ?Physical Activity: Not on file  ?Stress: Not on file  ?Social Connections: Not on file  ?Intimate Partner Violence: Not on file  ? ? ?FAMILY HISTORY:  ?Family History  ?Problem Relation Age of Onset  ? Diabetes Mother   ? Kidney disease Mother   ? Dementia Mother   ? Diabetes Father   ? Kidney Stones Sister   ? Diabetes Brother   ? Kidney Stones Brother   ? Scoliosis Son   ? Other Daughter   ?     infanct death  ? Kidney cancer Maternal Aunt 80  ? Breast cancer Paternal Aunt 76  ? Other Sister 65  ?     murdered  ? Other Sister 7  ?     murdered  ? Other Brother   ?     infant death  ? Breast cancer Maternal Aunt   ?     dx over 68  ? Breast cancer Paternal Aunt 52  ? Breast cancer Paternal Aunt   ?     dx over 7  ? ? ?CURRENT MEDICATIONS:  ?Current Outpatient Medications  ?Medication Sig Dispense Refill  ? alendronate (FOSAMAX) 35 MG tablet Take 1 tablet (35 mg total) by mouth every 7 (seven) days. Take with a full glass of water on an empty stomach. 5 tablet 6  ? anastrozole (ARIMIDEX) 1 MG tablet TAKE ONE TABLET BY MOUTH DAILY 30 tablet 6  ? atorvastatin (LIPITOR) 80 MG tablet Take 80 mg by mouth daily.    ? Blood Glucose Calibration (CONTOUR NEXT CONTROL) Normal SOLN  as directed per package instructions In Vitro    ? Blood Glucose Monitoring Suppl (CONTOUR NEXT MONITOR) w/Device KIT as directed per package instructions twice a day    ? cholecalciferol (VITAMIN D3) 25 MCG (1000 UT) tablet Take 2,000-4,000 Units by mouth See admin instructions. 2000 mcg daily, except once a week, take 4000 mcg daily    ? cyanocobalamin (,VITAMIN B-12,) 1000 MCG/ML injection Inject  1,000 mcg into the muscle every 30 (thirty) days.     ? cyclobenzaprine (FLEXERIL) 5 MG tablet Take 1 tablet (5 mg total) by mouth 3 (three) times daily as needed for muscle spasms. Do not drink alcohol or drive while taking this medication.  May cause drowsiness. 15 tablet 0  ? DULoxetine (CYMBALTA) 30 MG capsule duloxetine 30 mg capsule,delayed release ? TAKE THREE (3) CAPSULES BY MOUTH ONCE DAILY    ? Empagliflozin-metFORMIN HCl ER (SYNJARDY XR) 12.10-998 MG TB24 Take 2 tablets by mouth every morning.     ? gabapentin (NEURONTIN) 300 MG capsule Take 900 mg by mouth 3 (three) times daily as needed (pain).     ? glucose blood (CONTOUR NEXT TEST) test strip as directed per package instructions In Vitro twice a day    ? hydrOXYzine (ATARAX) 10 MG tablet hydroxyzine HCl 10 mg tablet ? TAKE ONE TABLET BY MOUTH EVERY 8 HOURS AS NEEDED FOR ANXIETY    ? ibuprofen (ADVIL) 800 MG tablet as needed.     ? insulin NPH Human (NOVOLIN N) 100 UNIT/ML injection (70/30) 94 Units Subcutaneous 30 min prior to breakfast and evening meal    ? insulin NPH-regular Human (NOVOLIN 70/30) (70-30) 100 UNIT/ML injection     ? lidocaine (LIDODERM) 5 % Place 1 patch onto the skin daily. Remove & Discard patch within 12 hours or as directed by MD 30 patch 0  ? lisinopril (PRINIVIL,ZESTRIL) 20 MG tablet Take 20 mg by mouth at bedtime.    ? pantoprazole (PROTONIX) 40 MG tablet Take 1 tablet (40 mg total) by mouth 2 (two) times daily. (Patient taking differently: Take 40 mg by mouth daily.) 60 tablet 11  ? SYRINGE-NEEDLE, DISP, 3 ML (B-D 3CC LUER-LOK  SYR 25GX5/8") 25G X 5/8" 3 ML MISC BD Luer-Lok Syringe 3 mL 25 x 5/8" ? USE TO INJECT VITAMIN B12 ONCE A MONTH.    ? TRULICITY 1.5 NW/2.9FA SOPN Inject into the skin.    ? ?No current facility-administered med

## 2021-11-12 ENCOUNTER — Other Ambulatory Visit (HOSPITAL_COMMUNITY): Payer: BC Managed Care – PPO

## 2021-11-19 ENCOUNTER — Ambulatory Visit (HOSPITAL_COMMUNITY): Payer: BC Managed Care – PPO | Admitting: Physician Assistant

## 2022-03-02 ENCOUNTER — Ambulatory Visit (HOSPITAL_COMMUNITY)
Admission: RE | Admit: 2022-03-02 | Discharge: 2022-03-02 | Disposition: A | Discharge status: Home / Self Care | Payer: Medicare Other | Source: Ambulatory Visit | Attending: Hematology | Admitting: Hematology

## 2022-03-02 DIAGNOSIS — C50412 Malignant neoplasm of upper-outer quadrant of left female breast: Secondary | ICD-10-CM | POA: Diagnosis not present

## 2022-03-02 DIAGNOSIS — Z170 Estrogen receptor positive status [ER+]: Secondary | ICD-10-CM | POA: Diagnosis present

## 2022-04-07 ENCOUNTER — Encounter: Payer: Self-pay | Admitting: Internal Medicine

## 2022-04-12 ENCOUNTER — Inpatient Hospital Stay: Payer: Medicare Other | Attending: Hematology

## 2022-04-12 ENCOUNTER — Ambulatory Visit (HOSPITAL_COMMUNITY)
Admission: RE | Admit: 2022-04-12 | Discharge: 2022-04-12 | Disposition: A | Discharge status: Home / Self Care | Payer: Medicare Other | Source: Ambulatory Visit | Attending: Hematology | Admitting: Hematology

## 2022-04-12 DIAGNOSIS — Z79811 Long term (current) use of aromatase inhibitors: Secondary | ICD-10-CM | POA: Diagnosis not present

## 2022-04-12 DIAGNOSIS — M85852 Other specified disorders of bone density and structure, left thigh: Secondary | ICD-10-CM

## 2022-04-12 DIAGNOSIS — R918 Other nonspecific abnormal finding of lung field: Secondary | ICD-10-CM | POA: Insufficient documentation

## 2022-04-12 DIAGNOSIS — C50412 Malignant neoplasm of upper-outer quadrant of left female breast: Secondary | ICD-10-CM | POA: Diagnosis present

## 2022-04-12 DIAGNOSIS — Z170 Estrogen receptor positive status [ER+]: Secondary | ICD-10-CM | POA: Insufficient documentation

## 2022-04-12 DIAGNOSIS — C773 Secondary and unspecified malignant neoplasm of axilla and upper limb lymph nodes: Secondary | ICD-10-CM | POA: Diagnosis not present

## 2022-04-12 DIAGNOSIS — M8580 Other specified disorders of bone density and structure, unspecified site: Secondary | ICD-10-CM | POA: Diagnosis not present

## 2022-04-12 DIAGNOSIS — Z923 Personal history of irradiation: Secondary | ICD-10-CM | POA: Insufficient documentation

## 2022-04-12 DIAGNOSIS — Z79899 Other long term (current) drug therapy: Secondary | ICD-10-CM | POA: Insufficient documentation

## 2022-04-12 DIAGNOSIS — G629 Polyneuropathy, unspecified: Secondary | ICD-10-CM | POA: Insufficient documentation

## 2022-04-12 DIAGNOSIS — D649 Anemia, unspecified: Secondary | ICD-10-CM

## 2022-04-12 LAB — CBC WITH DIFFERENTIAL/PLATELET
Abs Immature Granulocytes: 0.05 10*3/uL (ref 0.00–0.07)
Basophils Absolute: 0 10*3/uL (ref 0.0–0.1)
Basophils Relative: 1 %
Eosinophils Absolute: 0.1 10*3/uL (ref 0.0–0.5)
Eosinophils Relative: 2 %
HCT: 33 % — ABNORMAL LOW (ref 36.0–46.0)
Hemoglobin: 10.7 g/dL — ABNORMAL LOW (ref 12.0–15.0)
Immature Granulocytes: 1 %
Lymphocytes Relative: 25 %
Lymphs Abs: 1.4 10*3/uL (ref 0.7–4.0)
MCH: 30.6 pg (ref 26.0–34.0)
MCHC: 32.4 g/dL (ref 30.0–36.0)
MCV: 94.3 fL (ref 80.0–100.0)
Monocytes Absolute: 0.5 10*3/uL (ref 0.1–1.0)
Monocytes Relative: 8 %
Neutro Abs: 3.5 10*3/uL (ref 1.7–7.7)
Neutrophils Relative %: 63 %
Platelets: 195 10*3/uL (ref 150–400)
RBC: 3.5 MIL/uL — ABNORMAL LOW (ref 3.87–5.11)
RDW: 13.9 % (ref 11.5–15.5)
WBC: 5.5 10*3/uL (ref 4.0–10.5)
nRBC: 0 % (ref 0.0–0.2)

## 2022-04-12 LAB — COMPREHENSIVE METABOLIC PANEL
ALT: 30 U/L (ref 0–44)
AST: 27 U/L (ref 15–41)
Albumin: 3.3 g/dL — ABNORMAL LOW (ref 3.5–5.0)
Alkaline Phosphatase: 96 U/L (ref 38–126)
Anion gap: 8 (ref 5–15)
BUN: 19 mg/dL (ref 8–23)
CO2: 26 mmol/L (ref 22–32)
Calcium: 8.9 mg/dL (ref 8.9–10.3)
Chloride: 103 mmol/L (ref 98–111)
Creatinine, Ser: 0.92 mg/dL (ref 0.44–1.00)
GFR, Estimated: >60 mL/min (ref >60)
Glucose, Bld: 238 mg/dL — ABNORMAL HIGH (ref 70–99)
Potassium: 4.1 mmol/L (ref 3.5–5.1)
Sodium: 137 mmol/L (ref 135–145)
Total Bilirubin: 0.5 mg/dL (ref 0.3–1.2)
Total Protein: 6.2 g/dL — ABNORMAL LOW (ref 6.5–8.1)

## 2022-04-12 LAB — VITAMIN D 25 HYDROXY (VIT D DEFICIENCY, FRACTURES): Vit D, 25-Hydroxy: 36.25 ng/mL (ref 30–100)

## 2022-04-12 MED ORDER — IOHEXOL 300 MG/ML  SOLN
80.0000 mL | Freq: Once | INTRAMUSCULAR | Status: AC | PRN
Start: 2022-04-12 — End: 2022-04-12
  Administered 2022-04-12: 80 mL via INTRAVENOUS

## 2022-04-19 ENCOUNTER — Inpatient Hospital Stay (HOSPITAL_BASED_OUTPATIENT_CLINIC_OR_DEPARTMENT_OTHER): Payer: Medicare Other | Admitting: Hematology

## 2022-04-19 VITALS — BP 141/57 | HR 75 | Temp 98.5°F | Resp 16 | Wt 216.0 lb

## 2022-04-19 DIAGNOSIS — D649 Anemia, unspecified: Secondary | ICD-10-CM | POA: Diagnosis not present

## 2022-04-19 DIAGNOSIS — E559 Vitamin D deficiency, unspecified: Secondary | ICD-10-CM | POA: Diagnosis not present

## 2022-04-19 DIAGNOSIS — C50412 Malignant neoplasm of upper-outer quadrant of left female breast: Secondary | ICD-10-CM

## 2022-04-19 DIAGNOSIS — Z170 Estrogen receptor positive status [ER+]: Secondary | ICD-10-CM | POA: Diagnosis not present

## 2022-04-19 NOTE — Patient Instructions (Addendum)
[place] at [facility] Discharge Instructions   You were seen and examined today by Dr. Delton Coombes.  He reviewed the results of your CT scan which is stable.   He reviewed the results of your lab work. Most results are normal/stable. Your hemoglobin has dropped to 10.7 from 12. We will check your iron at your next visit.   Continue anastrozole as prescribed.   We will see you back in 6 months. We will repeat lab work prior to this visit.    Thank you for choosing [place] at [facility] to provide your oncology and hematology care.  To afford each patient quality time with our provider, please arrive at least 15 minutes before your scheduled appointment time.   If you have a lab appointment with the [facility] please come in thru the Main Entrance and check in at the main information desk.  You need to re-schedule your appointment should you arrive 10 or more minutes late.  We strive to give you quality time with our providers, and arriving late affects you and other patients whose appointments are after yours.  Also, if you no show three or more times for appointments you may be dismissed from the clinic at the providers discretion.     Again, thank you for choosing [facility]  Our hope is that these requests will decrease the amount of time that you wait before being seen by our physicians.       _____________________________________________________________  Should you have questions after your visit to [facility], please contact our office at [phone] and follow the prompts.  Our office hours are 8:00 a.m. and 4:30 p.m. Monday - Friday.  Please note that voicemails left after 4:00 p.m. may not be returned until the following business day.  We are closed weekends and major holidays.  You do have access to a nurse 24-7, just call the main number to the clinic [phone] and do not press any options,  hold on the line and a nurse will answer the phone.    For prescription refill requests, have your pharmacy contact our office and allow 72 hours.    Due to Covid, you will need to wear a mask upon entering the hospital. If you do not have a mask, a mask will be given to you at the Main Entrance upon arrival. For doctor visits, patients may have 1 support person age 56 or older with them. For treatment visits, patients can not have anyone with them due to social distancing guidelines and our immunocompromised population.

## 2022-04-19 NOTE — Progress Notes (Signed)
[place], [place] [address]   CLINIC:  Medical Oncology/Hematology  PCP:  Practice, [facility] [place] / [place] [address] [phone]   REASON FOR VISIT:  Follow-up for lung nodules with history of breast cancer  PRIOR THERAPY:  Left lumpectomy on 03/27/2019 XRT completed on 07/18/2019  NGS Results: not done  CURRENT THERAPY: anastrozole  BRIEF ONCOLOGIC HISTORY:  Oncology History  Breast cancer of upper-outer quadrant of left female breast ([place])  03/06/2019 Initial Diagnosis   Breast cancer of upper-outer quadrant of left female breast ([place])   04/24/2019 Cancer Staging   Staging form: Breast, AJCC 8th Edition - Clinical: Stage IB (cT1c, cN1, cM0, G2, ER+, PR+, HER2-) - Signed by Derek Jack, MD on 04/24/2019   07/11/2019 Genetic Testing   Negative genetic testing on the common hereditary cancer panel.  The Common Hereditary Gene Panel offered by [facility] includes sequencing and/or deletion duplication testing of the following 48 genes: APC, ATM, AXIN2, BARD1, BMPR1A, BRCA1, BRCA2, BRIP1, CDH1, CDK4, CDKN2A (p14ARF), CDKN2A (p16INK4a), CHEK2, CTNNA1, DICER1, EPCAM (Deletion/duplication testing only), GREM1 (promoter region deletion/duplication testing only), KIT, MEN1, MLH1, MSH2, MSH3, MSH6, MUTYH, NBN, NF1, NHTL1, PALB2, PDGFRA, PMS2, POLD1, POLE, PTEN, RAD50, RAD51C, RAD51D, RNF43, SDHB, SDHC, SDHD, SMAD4, SMARCA4. STK11, TP53, TSC1, TSC2, and VHL.  The following genes were evaluated for sequence changes only: SDHA and HOXB13 c.251G>A variant only. The report date is July 11, 2019.     CANCER STAGING:  Cancer Staging  Breast cancer of upper-outer quadrant of left female breast ([place]) Staging form: Breast, AJCC 8th Edition - Clinical: Stage IB (cT1c, cN1, cM0, G2, ER+, PR+, HER2-) - Signed by Derek Jack, MD on 04/24/2019   INTERVAL HISTORY:  [name], a 66 y.o. female, seen for follow-up of  breast cancer and lung nodules.  She reports that she has not been taking Fosamax as she forgot about 4 to 5 months ago.  She continues to be on gabapentin 900 mg 3 times daily.  REVIEW OF SYSTEMS:  Review of Systems  Constitutional:  Negative for appetite change and fatigue.  Neurological:  Positive for numbness (Stinging and burning in the feet and hands).  All other systems reviewed and are negative.   PAST MEDICAL/SURGICAL HISTORY:  Past Medical History:  Diagnosis Date   Anxiety    Arthritis    Breast cancer ([place])    left   CTS (carpal tunnel syndrome)    Depression    Diabetes mellitus    ORAL MED   Family history of breast cancer    GERD (gastroesophageal reflux disease)    Helicobacter pylori gastritis    Hiatal hernia    HNP (herniated nucleus pulposus), lumbar    PT TRYING TO AVOID LUMBAR SURGERY--STATES PAIN IN HER BACK AND SOMETIMES DOWN ONE OR THE OTHER LEG   Hypertension    Personal history of radiation therapy    Plantar fasciitis    BILATERAL   Schatzki's ring    Shortness of breath    WITH EXERTION-PT RELATES TO HER WEIGHT   Sleep apnea    MODERATE- PER SLEEP STUDY 06/2010--PT USES CPAP-SETTING IS 11   Past Surgical History:  Procedure Laterality Date   ABDOMINAL HYSTERECTOMY     BREAST LUMPECTOMY     BREAST LUMPECTOMY WITH RADIOACTIVE SEED AND SENTINEL LYMPH NODE BIOPSY Left 03/27/2019   Procedure: LEFT BREAST LUMPECTOMY WITH RADIOACTIVE SEED AND LEFT AXILLARY DEEP SENTINEL LYMPH NODE BIOPSY  INJECT BLUE DYE;  Surgeon: Fanny Skates, MD;  Location: [facility];  Service: General;  Laterality: Left;   CARPAL TUNNEL RELEASE     BILATERAL   CHOLECYSTECTOMY     CYST REMOVED     LEFT THUMB AND RT MIDDLE FINGER   ESOPHAGECTOMY     TONSILLECTOMY     TRIGGER FINGER REPAIR      SOCIAL HISTORY:  Social History   Socioeconomic History   Marital status: Married    Spouse name: Not on file   Number of children: 2   Years of education: GED   Highest education  level: Not on file  Occupational History   Occupation: Scientist, product/process development: [employer]  Tobacco Use   Smoking status: Former    Packs/day: 1.00    Years: 25.00    Total pack years: 25.00    Types: Cigarettes    Quit date: 03/05/2008    Years since quitting: 14.1   Smokeless tobacco: Never   Tobacco comments:    03/2008  Vaping Use   Vaping Use: Never used  Substance and Sexual Activity   Alcohol use: No   Drug use: No   Sexual activity: Not on file    Comment: hysterectomy  Other Topics Concern   Not on file  Social History Narrative   Lives at home with her husband.   Right-handed.   Caffeine use: 20 ounces per day.   Social Determinants of Health   Financial Resource Strain: Medium Risk (03/06/2019)   Overall Financial Resource Strain (CARDIA)    Difficulty of Paying Living Expenses: Somewhat hard  Food Insecurity: No Food Insecurity (03/06/2019)   Hunger Vital Sign    Worried About Running Out of Food in the Last Year: Never true    Ran Out of Food in the Last Year: Never true  Transportation Needs: No Transportation Needs (03/06/2019)   PRAPARE - Hydrologist (Medical): No    Lack of Transportation (Non-Medical): No  Physical Activity: Inactive (03/06/2019)   Exercise Vital Sign    Days of Exercise per Week: 0 days    Minutes of Exercise per Session: 0 min  Stress: Stress Concern Present (03/06/2019)   [place]    Feeling of Stress : To some extent  Social Connections: Socially Integrated (03/06/2019)   Social Connection and Isolation Panel [NHANES]    Frequency of Communication with Friends and Family: Three times a week    Frequency of Social Gatherings with Friends and Family: Once a week    Attends Religious Services: More than 4 times per year    Active Member of [employer] or Organizations: Yes    Attends Music therapist: More than 4 times per  year    Marital Status: Married  Human resources officer Violence: Not At Risk (03/06/2019)   Humiliation, Afraid, Rape, and Kick questionnaire    Fear of Current or Ex-Partner: No    Emotionally Abused: No    Physically Abused: No    Sexually Abused: No    FAMILY HISTORY:  Family History  Problem Relation Age of Onset   Diabetes Mother    Kidney disease Mother    Dementia Mother    Diabetes Father    Kidney Stones Sister    Diabetes Brother    Kidney Stones Brother    Scoliosis Son    Other Daughter  infanct death   Kidney cancer Maternal Aunt 55   Breast cancer Paternal Aunt 29   Other Sister 38       murdered   Other Sister 56       murdered   Other Brother        infant death   Breast cancer Maternal Aunt        dx over 10   Breast cancer Paternal Aunt 64   Breast cancer Paternal Aunt        dx over 61    CURRENT MEDICATIONS:  Current Outpatient Medications  Medication Sig Dispense Refill   alendronate (FOSAMAX) 35 MG tablet Take 1 tablet (35 mg total) by mouth every 7 (seven) days. Take with a full glass of water on an empty stomach. 5 tablet 6   anastrozole (ARIMIDEX) 1 MG tablet TAKE ONE TABLET BY MOUTH DAILY 30 tablet 6   atorvastatin (LIPITOR) 80 MG tablet Take 80 mg by mouth daily.     Blood Glucose Calibration (CONTOUR NEXT CONTROL) Normal SOLN as directed per package instructions In Vitro     Blood Glucose Monitoring Suppl (CONTOUR NEXT MONITOR) w/Device KIT as directed per package instructions twice a day     cholecalciferol (VITAMIN D3) 25 MCG (1000 UT) tablet Take 2,000-4,000 Units by mouth See admin instructions. 2000 mcg daily, except once a week, take 4000 mcg daily     cyanocobalamin (,VITAMIN B-12,) 1000 MCG/ML injection Inject 1,000 mcg into the muscle every 30 (thirty) days.      DULoxetine (CYMBALTA) 30 MG capsule duloxetine 30 mg capsule,delayed release  TAKE THREE (3) CAPSULES BY MOUTH ONCE DAILY     gabapentin (NEURONTIN) 300 MG capsule Take 900  mg by mouth 3 (three) times daily as needed (pain).      glucose blood (CONTOUR NEXT TEST) test strip as directed per package instructions In Vitro twice a day     hydrOXYzine (ATARAX) 10 MG tablet hydroxyzine HCl 10 mg tablet  TAKE ONE TABLET BY MOUTH EVERY 8 HOURS AS NEEDED FOR ANXIETY     ibuprofen (ADVIL) 800 MG tablet as needed.      insulin NPH Human (NOVOLIN N) 100 UNIT/ML injection (70/30) 94 Units Subcutaneous 30 min prior to breakfast and evening meal     insulin NPH-regular Human (NOVOLIN 70/30) (70-30) 100 UNIT/ML injection      lidocaine (LIDODERM) 5 % Place 1 patch onto the skin daily. Remove & Discard patch within 12 hours or as directed by MD 30 patch 0   lisinopril (PRINIVIL,ZESTRIL) 20 MG tablet Take 20 mg by mouth at bedtime.     METFORMIN HCL ER PO Take 1,000 mg by mouth 2 (two) times daily with a meal.     pantoprazole (PROTONIX) 40 MG tablet Take 1 tablet (40 mg total) by mouth 2 (two) times daily. (Patient taking differently: Take 40 mg by mouth daily.) 60 tablet 11   SYRINGE-NEEDLE, DISP, 3 ML (B-D 3CC LUER-LOK SYR 25GX5/8") 25G X 5/8" 3 ML MISC BD Luer-Lok Syringe 3 mL 25 x 5/8"  USE TO INJECT VITAMIN B12 ONCE A MONTH.     No current facility-administered medications for this visit.    ALLERGIES:  Allergies  Allergen Reactions   Celecoxib Other (See Comments)   Exenatide     Other reaction(s): knots at injection sites for months   Percocet [Oxycodone-Acetaminophen] Itching    PT CAN TAKE WITH BENADRYL   Acetaminophen Rash    Other reaction(s): Other (See  Comments)   Hydrocodone Itching    PT WOULD TAKE WITH BENADRYL PT WOULD TAKE WITH BENADRYL   Oxycodone Hcl Rash   Oxycodone-Acetaminophen Itching    PT CAN TAKE WITH BENADRYL    PHYSICAL EXAM:  Performance status (ECOG): 0 - Asymptomatic  Vitals:   04/19/22 1134  BP: (!) 141/57  Pulse: 75  Resp: 16  Temp: 98.5 F (36.9 C)  SpO2: 99%   Wt Readings from Last 3 Encounters:  04/19/22 216 lb (98 kg)   10/08/21 210 lb 8.6 oz (95.5 kg)  05/14/21 205 lb 11 oz (93.3 kg)   Physical Exam Vitals reviewed.  Constitutional:      Appearance: Normal appearance. She is obese.  Cardiovascular:     Rate and Rhythm: Normal rate and regular rhythm.     Pulses: Normal pulses.     Heart sounds: Normal heart sounds.  Pulmonary:     Effort: Pulmonary effort is normal.     Breath sounds: Normal breath sounds.  Neurological:     General: No focal deficit present.     Mental Status: She is alert and oriented to person, place, and time.  Psychiatric:        Mood and Affect: Mood normal.        Behavior: Behavior normal.      LABORATORY DATA:  I have reviewed the labs as listed.     Latest Ref Rng & Units 04/12/2022    8:40 AM 05/07/2021    1:02 PM 09/30/2020    1:22 PM  CBC  WBC 4.0 - 10.5 K/uL 5.5  7.7  5.9   Hemoglobin 12.0 - 15.0 g/dL 10.7  12.2  12.0   Hematocrit 36.0 - 46.0 % 33.0  38.0  36.9   Platelets 150 - 400 K/uL 195  228  227       Latest Ref Rng & Units 04/12/2022    8:40 AM 09/10/2021   10:10 AM 05/07/2021    1:02 PM  CMP  Glucose 70 - 99 mg/dL 238   203   BUN 8 - 23 mg/dL 19   20   Creatinine 0.44 - 1.00 mg/dL 0.92  0.90  0.98   Sodium 135 - 145 mmol/L 137   139   Potassium 3.5 - 5.1 mmol/L 4.1   3.8   Chloride 98 - 111 mmol/L 103   103   CO2 22 - 32 mmol/L 26   27   Calcium 8.9 - 10.3 mg/dL 8.9   9.4   Total Protein 6.5 - 8.1 g/dL 6.2   7.0   Total Bilirubin 0.3 - 1.2 mg/dL 0.5   0.3   Alkaline Phos 38 - 126 U/L 96   108   AST 15 - 41 U/L 27   15   ALT 0 - 44 U/L 30   20     DIAGNOSTIC IMAGING:  I have independently reviewed the scans and discussed with the patient. CT Chest W Contrast  Result Date: 04/14/2022 CLINICAL DATA:  Pulmonary nodule. EXAM: CT CHEST WITH CONTRAST TECHNIQUE: Multidetector CT imaging of the chest was performed during intravenous contrast administration. RADIATION DOSE REDUCTION: This exam was performed according to the departmental  dose-optimization program which includes automated exposure control, adjustment of the mA and/or kV according to patient size and/or use of iterative reconstruction technique. CONTRAST:  53m OMNIPAQUE IOHEXOL 300 MG/ML  SOLN COMPARISON:  09/10/2021 FINDINGS: Cardiovascular: The heart size is normal. No substantial pericardial effusion. Coronary artery calcification  is evident. Mild atherosclerotic calcification is noted in the wall of the thoracic aorta. Mediastinum/Nodes: No mediastinal lymphadenopathy. There is no hilar lymphadenopathy. Tiny hiatal hernia. The esophagus has normal imaging features. There is no axillary lymphadenopathy. Lungs/Pleura: Stable architectural distortion and scarring in the left upper lobe. 17 x 9 mm multi lobular nodule in the posterior right upper lobe (70/4) is stable in the interval. A loose group of tiny nodules in the posterior right lower lobe (images 93, 104, 105/series 4) is unchanged. Tiny peripheral left lower lobe nodule on 89/4 is unchanged. No new suspicious pulmonary nodule or mass. Upper Abdomen: Unremarkable. Musculoskeletal: No worrisome lytic or sclerotic osseous abnormality. IMPRESSION: 1. Stable exam. No new or progressive findings. 2. Stable 17 x 9 mm multi lobular nodule in the posterior right upper lobe. Other scattered tiny bilateral pulmonary nodules are unchanged. 3. Stable architectural distortion and scarring in the left upper lobe. 4. Tiny hiatal hernia. 5.  Aortic Atherosclerosis (ICD10-I70.0). Electronically Signed   By: Misty Stanley M.D.   On: 04/14/2022 08:48     ASSESSMENT:  Bilateral lung nodules: - She had a fall on 08/22/2021 with left lower rib fractures. - CT chest with contrast (09/10/2021): Multiple bilateral lung nodules, largest irregular nodule of the posterior inferior right upper lobe measuring 1.7 x 1.0 cm, 0.4 cm nodule in the left apex, right lower lobe 0.4 cm lung nodule. - Denies any recent infections or antibiotic use. - No B  symptoms.    Social/family history: - She works at [employer].  Quit smoking in 2009.  1 pack/day for 20 years. - 3 paternal aunts had breast cancer.  3.  Left breast IDC: - Left lumpectomy (03/27/2019): 1.1 cm invasive ductal carcinoma, grade 2, margins negative.  Negative LVI/perineural invasion.  1/1 metastatic carcinoma.  PT1CPN1A.  ER 100%, PR 100%, Ki-67 2%, HER2 2+, negative by FISH. - XRT completed on 07/18/2019. - She is on anastrozole.   PLAN:  Bilateral lung nodules: -Previous PET scan (10/01/2021) did not show uptake in the 1.7 cm irregular nodular lesion in the right upper lobe.  No lymphadenopathy. - CT chest with contrast (04/12/2022): Stable 1.7 cm multilobular nodule in the posterior right upper lobe.  Other scattered tiny bilateral lung nodules are unchanged when compared to scan from 09/10/2021.  No mediastinal or hilar adenopathy. - Will consider CT chest without contrast in a year.  2.  Left breast IDC: - She is tolerating anastrozole without any major side effects. - Mammogram (03/02/2022): BI-RADS Category 2. - Continue anastrozole.  RTC 6 months for follow-up.  3.  Peripheral neuropathy: - Reports stinging and burning in the feet and hands.  Continue gabapentin 900 mg 3 times daily.  4.  Osteopenia: - She stopped taking Fosamax 35 mg weekly about 4 to 5 months ago. - Continue calcium and vitamin D supplements. - I will repeat DEXA scan in November 2024.  If any worsening, will consider reintroducing bisphosphonates.   Orders placed this encounter:  Orders Placed This Encounter  Procedures   CBC with Differential   Comprehensive metabolic panel   VITAMIN D 25 Hydroxy (Vit-D Deficiency, Fractures)   Ferritin   Iron and TIBC ([facility]/[place] ONLY)      Derek Jack, MD [place] [phone]

## 2022-04-24 ENCOUNTER — Other Ambulatory Visit: Payer: Self-pay | Admitting: Hematology

## 2022-04-24 DIAGNOSIS — Z170 Estrogen receptor positive status [ER+]: Secondary | ICD-10-CM

## 2022-04-26 ENCOUNTER — Other Ambulatory Visit: Payer: Self-pay | Admitting: *Deleted

## 2022-04-26 NOTE — Telephone Encounter (Signed)
Anastrozole refill approved.  Patient tolerating and is to continue therapy. 

## 2022-05-12 ENCOUNTER — Ambulatory Visit (AMBULATORY_SURGERY_CENTER): Payer: Medicare Other | Admitting: *Deleted

## 2022-05-12 ENCOUNTER — Telehealth: Payer: Self-pay | Admitting: *Deleted

## 2022-05-12 VITALS — Ht 65.0 in | Wt 221.2 lb

## 2022-05-12 DIAGNOSIS — Z86010 Personal history of colonic polyps: Secondary | ICD-10-CM

## 2022-05-12 MED ORDER — ONDANSETRON HCL 4 MG PO TABS: ORAL_TABLET | ORAL | 0 refills | Status: DC

## 2022-05-12 MED ORDER — NA SULFATE-K SULFATE-MG SULF 17.5-3.13-1.6 GM/177ML PO SOLN: 1.0000 | Freq: Once | ORAL | 0 refills | Status: AC

## 2022-05-12 NOTE — Progress Notes (Addendum)
No egg or soy allergy known to patient  No issues known to pt with past sedation with any surgeries or procedures Patient denies ever being told they had issues or difficulty with intubation  No FH of Malignant Hyperthermia Pt is not on diet pills Pt is not on  home 02  Pt is not on blood thinners  Pt denies  constipation ,stated she only has bowel movement times a week,2 day prep given. Pt.stated last time "I drunk that stuff it made me sick". Zofran given to take prior to each dose prep 30-60 minutes. No A fib or A flutter Have any cardiac testing pending--NO Pt instructed to use Singlecare.com or GoodRx for a price reduction on prep    Patient's chart reviewed by Osvaldo Angst CNRA prior to previsit and patient appropriate for the [facility].  Previsit completed and red dot placed by patient's name on their procedure day (on provider's schedule).

## 2022-05-12 NOTE — Telephone Encounter (Signed)
NOTED

## 2022-05-12 NOTE — Telephone Encounter (Signed)
Pt.had pre-visit this a.m. procedure scheduled for 05/27/22,she is requesting  EGD at same time of colonoscopy so she can have esophagus dilated,pt.said she is having dysphagia with food just about every time she eats,explained to her that dates may change and would have to get authorization with insurance but she was trying to have it done before new year,please advise ?

## 2022-05-12 NOTE — Telephone Encounter (Signed)
History of dysphagia; prior dilation and Schatzki's ring Okay to add upper endoscopy to colonoscopy; but I will need to start on this today at 730 to accommodate the extra procedure Can you please let the [facility] know if this works and if so EGD can be added Thanks

## 2022-05-27 ENCOUNTER — Encounter: Payer: Self-pay | Admitting: Internal Medicine

## 2022-05-27 ENCOUNTER — Ambulatory Visit (AMBULATORY_SURGERY_CENTER): Payer: Medicare Other | Admitting: Internal Medicine

## 2022-05-27 VITALS — BP 110/42 | HR 71 | Temp 97.7°F | Resp 12 | Ht 65.0 in | Wt 221.2 lb

## 2022-05-27 DIAGNOSIS — K219 Gastro-esophageal reflux disease without esophagitis: Secondary | ICD-10-CM

## 2022-05-27 DIAGNOSIS — D123 Benign neoplasm of transverse colon: Secondary | ICD-10-CM

## 2022-05-27 DIAGNOSIS — Z09 Encounter for follow-up examination after completed treatment for conditions other than malignant neoplasm: Secondary | ICD-10-CM

## 2022-05-27 DIAGNOSIS — K222 Esophageal obstruction: Secondary | ICD-10-CM | POA: Diagnosis not present

## 2022-05-27 DIAGNOSIS — R1310 Dysphagia, unspecified: Secondary | ICD-10-CM

## 2022-05-27 DIAGNOSIS — D122 Benign neoplasm of ascending colon: Secondary | ICD-10-CM | POA: Diagnosis not present

## 2022-05-27 DIAGNOSIS — D125 Benign neoplasm of sigmoid colon: Secondary | ICD-10-CM

## 2022-05-27 DIAGNOSIS — Z86010 Personal history of colonic polyps: Secondary | ICD-10-CM | POA: Diagnosis not present

## 2022-05-27 DIAGNOSIS — R1319 Other dysphagia: Secondary | ICD-10-CM

## 2022-05-27 DIAGNOSIS — K229 Disease of esophagus, unspecified: Secondary | ICD-10-CM | POA: Diagnosis not present

## 2022-05-27 MED ORDER — SODIUM CHLORIDE 0.9 % IV SOLN: 500.0000 mL | INTRAVENOUS | Status: AC

## 2022-05-27 NOTE — Progress Notes (Signed)
GASTROENTEROLOGY PROCEDURE H&P NOTE   Primary Care Physician: Practice, [facility]    Reason for Procedure:  Esophageal dysphagia and history of colon polyps  Plan:    EGD and colonoscopy  Patient is appropriate for endoscopic procedure(s) in the ambulatory ([place]) setting.  The nature of the procedure, as well as the risks, benefits, and alternatives were carefully and thoroughly reviewed with the patient. Ample time for discussion and questions allowed. The patient understood, was satisfied, and agreed to proceed.     HPI: Erica Giles is a 66 y.o. female who presents for EGD and colonoscopy.  Medical history as below.  Tolerated the prep.  No recent chest pain or shortness of breath.  No abdominal pain today.  Past Medical History:  Diagnosis Date   Anxiety    Arthritis    Breast cancer ([place])    left   Cataract    BEGINNING STAGES   CTS (carpal tunnel syndrome)    Depression    Diabetes mellitus    ORAL MED   Family history of breast cancer    GERD (gastroesophageal reflux disease)    Helicobacter pylori gastritis    Hiatal hernia    HNP (herniated nucleus pulposus), lumbar    PT TRYING TO AVOID LUMBAR SURGERY--STATES PAIN IN HER BACK AND SOMETIMES DOWN ONE OR THE OTHER LEG   Hyperlipidemia    Hypertension    Osteopenia    Personal history of radiation therapy    Plantar fasciitis    BILATERAL   Schatzki's ring    Shortness of breath    WITH EXERTION-PT RELATES TO HER WEIGHT   Sleep apnea    MODERATE- PER SLEEP STUDY 06/2010--PT USES CPAP-SETTING IS 11    Past Surgical History:  Procedure Laterality Date   ABDOMINAL HYSTERECTOMY     BREAST LUMPECTOMY     BREAST LUMPECTOMY WITH RADIOACTIVE SEED AND SENTINEL LYMPH NODE BIOPSY Left 03/27/2019   Procedure: LEFT BREAST LUMPECTOMY WITH RADIOACTIVE SEED AND LEFT AXILLARY DEEP SENTINEL LYMPH NODE BIOPSY INJECT BLUE DYE;  Surgeon: Fanny Skates, MD;  Location: [facility];  Service: General;  Laterality: Left;    CARPAL TUNNEL RELEASE     BILATERAL   CHOLECYSTECTOMY     CYST REMOVED     LEFT THUMB AND RT MIDDLE FINGER   TONSILLECTOMY     TRIGGER FINGER REPAIR     UPPER GASTROINTESTINAL ENDOSCOPY      Prior to Admission medications   Medication Sig Start Date End Date Taking? Authorizing Provider  anastrozole (ARIMIDEX) 1 MG tablet TAKE ONE TABLET BY MOUTH ONCE DAILY 04/26/22  Yes Derek Jack, MD  atorvastatin (LIPITOR) 80 MG tablet Take 80 mg by mouth daily. 03/30/20  Yes [provider]  gabapentin (NEURONTIN) 300 MG capsule Take 900 mg by mouth 3 (three) times daily as needed (pain).    Yes [provider]  hydrOXYzine (ATARAX) 10 MG tablet hydroxyzine HCl 10 mg tablet  TAKE ONE TABLET BY MOUTH EVERY 8 HOURS AS NEEDED FOR ANXIETY 08/04/21  Yes [provider]  insulin NPH Human (NOVOLIN N) 100 UNIT/ML injection 140 UNITS AM, 100 UN ITS IN P.M   Yes [provider]  insulin NPH-regular Human (NOVOLIN 70/30) (70-30) 100 UNIT/ML injection    Yes [provider]  lisinopril (PRINIVIL,ZESTRIL) 20 MG tablet Take 20 mg by mouth at bedtime.   Yes [provider]  METFORMIN HCL ER PO Take 1,000 mg by mouth 2 (two) times daily with  a meal. 01/21/22  Yes [provider]  ondansetron (ZOFRAN) 4 MG tablet TAKE 30-GO MINUTES PRIOR TO EACH PREP DOSE 05/12/22  Yes Lennette Fader, Lajuan Lines, MD  pantoprazole (PROTONIX) 40 MG tablet Take 1 tablet (40 mg total) by mouth 2 (two) times daily. Patient taking differently: Take 40 mg by mouth daily. 02/22/14  Yes Crixus Mcaulay, Lajuan Lines, MD  trolamine salicylate (ASPERCREME) 10 % cream Apply 1 Application topically as needed for muscle pain.   Yes [provider]  alendronate (FOSAMAX) 35 MG tablet Take 1 tablet (35 mg total) by mouth every 7 (seven) days. Take with a full glass of water on an empty stomach. 05/14/21   Harriett Rush, PA-C  Blood Glucose Calibration (CONTOUR NEXT CONTROL) Normal SOLN as  directed per package instructions In Vitro Patient not taking: Reported on 05/12/2022    [provider]  Blood Glucose Monitoring Suppl (CONTOUR NEXT MONITOR) w/Device KIT as directed per package instructions twice a day Patient not taking: Reported on 05/12/2022    [provider]  cholecalciferol (VITAMIN D3) 25 MCG (1000 UT) tablet Take 2,000-4,000 Units by mouth See admin instructions. 2000 mcg daily, except once a week, take 4000 mcg daily Patient not taking: Reported on 05/12/2022    [provider]  cyanocobalamin (,VITAMIN B-12,) 1000 MCG/ML injection Inject 1,000 mcg into the muscle every 30 (thirty) days.  02/17/19   [provider]  glucose blood (CONTOUR NEXT TEST) test strip as directed per package instructions In Vitro twice a day Patient not taking: Reported on 05/12/2022    [provider]  ibuprofen (ADVIL) 800 MG tablet as needed.     [provider]  lidocaine (LIDODERM) 5 % Place 1 patch onto the skin daily. Remove & Discard patch within 12 hours or as directed by MD Patient not taking: Reported on 05/12/2022 09/07/21   Volney American, PA-C  SYRINGE-NEEDLE, DISP, 3 ML (B-D 3CC LUER-LOK SYR 25GX5/8") 25G X 5/8" 3 ML MISC BD Luer-Lok Syringe 3 mL 25 x 5/8"  USE TO INJECT VITAMIN B12 ONCE A MONTH.    [provider]    Current Outpatient Medications  Medication Sig Dispense Refill   anastrozole (ARIMIDEX) 1 MG tablet TAKE ONE TABLET BY MOUTH ONCE DAILY 30 tablet 2   atorvastatin (LIPITOR) 80 MG tablet Take 80 mg by mouth daily.     gabapentin (NEURONTIN) 300 MG capsule Take 900 mg by mouth 3 (three) times daily as needed (pain).      hydrOXYzine (ATARAX) 10 MG tablet hydroxyzine HCl 10 mg tablet  TAKE ONE TABLET BY MOUTH EVERY 8 HOURS AS NEEDED FOR ANXIETY     insulin NPH Human (NOVOLIN N) 100 UNIT/ML injection 140 UNITS AM, 100 UN ITS IN P.M     insulin NPH-regular Human (NOVOLIN 70/30) (70-30) 100 UNIT/ML  injection      lisinopril (PRINIVIL,ZESTRIL) 20 MG tablet Take 20 mg by mouth at bedtime.     METFORMIN HCL ER PO Take 1,000 mg by mouth 2 (two) times daily with a meal.     ondansetron (ZOFRAN) 4 MG tablet TAKE 30-GO MINUTES PRIOR TO EACH PREP DOSE 4 tablet 0   pantoprazole (PROTONIX) 40 MG tablet Take 1 tablet (40 mg total) by mouth 2 (two) times daily. (Patient taking differently: Take 40 mg by mouth daily.) 60 tablet 11   trolamine salicylate (ASPERCREME) 10 % cream Apply 1 Application topically as needed for muscle pain.     alendronate (FOSAMAX)  35 MG tablet Take 1 tablet (35 mg total) by mouth every 7 (seven) days. Take with a full glass of water on an empty stomach. 5 tablet 6   Blood Glucose Calibration (CONTOUR NEXT CONTROL) Normal SOLN as directed per package instructions In Vitro (Patient not taking: Reported on 05/12/2022)     Blood Glucose Monitoring Suppl (CONTOUR NEXT MONITOR) w/Device KIT as directed per package instructions twice a day (Patient not taking: Reported on 05/12/2022)     cholecalciferol (VITAMIN D3) 25 MCG (1000 UT) tablet Take 2,000-4,000 Units by mouth See admin instructions. 2000 mcg daily, except once a week, take 4000 mcg daily (Patient not taking: Reported on 05/12/2022)     cyanocobalamin (,VITAMIN B-12,) 1000 MCG/ML injection Inject 1,000 mcg into the muscle every 30 (thirty) days.      glucose blood (CONTOUR NEXT TEST) test strip as directed per package instructions In Vitro twice a day (Patient not taking: Reported on 05/12/2022)     ibuprofen (ADVIL) 800 MG tablet as needed.      lidocaine (LIDODERM) 5 % Place 1 patch onto the skin daily. Remove & Discard patch within 12 hours or as directed by MD (Patient not taking: Reported on 05/12/2022) 30 patch 0   SYRINGE-NEEDLE, DISP, 3 ML (B-D 3CC LUER-LOK SYR 25GX5/8") 25G X 5/8" 3 ML MISC BD Luer-Lok Syringe 3 mL 25 x 5/8"  USE TO INJECT VITAMIN B12 ONCE A MONTH.     Current Facility-Administered Medications   Medication Dose Route Frequency Provider Last Rate Last Admin   0.9 %  sodium chloride infusion  500 mL Intravenous Continuous Tammara Massing, Lajuan Lines, MD        Allergies as of 05/27/2022 - Review Complete 05/27/2022  Allergen Reaction Noted   Celecoxib Other (See Comments) 10/21/2020   Exenatide  04/13/2021   Percocet [oxycodone-acetaminophen] Itching 06/29/2011   Acetaminophen Rash 10/21/2020   Hydrocodone Itching 06/29/2011   Oxycodone hcl Rash 10/21/2020   Oxycodone-acetaminophen Itching 06/29/2011    Family History  Problem Relation Age of Onset   Diabetes Mother    Kidney disease Mother    Dementia Mother    Diabetes Father    Kidney Stones Sister    Other Sister 6       murdered   Other Sister 7       murdered   Diabetes Brother    Kidney Stones Brother    Other Brother        infant death   Kidney cancer Maternal Aunt 67   Breast cancer Maternal Aunt        dx over 26   Breast cancer Paternal Aunt 38   Breast cancer Paternal Aunt 4   Breast cancer Paternal Aunt        dx over 67   Other Daughter        infanct death   Scoliosis Son    Colon cancer Neg Hx    Colon polyps Neg Hx    Esophageal cancer Neg Hx    Ulcerative colitis Neg Hx    Stomach cancer Neg Hx    Rectal cancer Neg Hx     Social History   Socioeconomic History   Marital status: Married    Spouse name: Not on file   Number of children: 2   Years of education: GED   Highest education level: Not on file  Occupational History   Occupation: Scientist, product/process development: [employer]  Tobacco Use   Smoking  status: Former    Packs/day: 1.00    Years: 25.00    Total pack years: 25.00    Types: Cigarettes    Quit date: 03/05/2008    Years since quitting: 14.2    Passive exposure: Current (HUSBAND SMOKES)   Smokeless tobacco: Never   Tobacco comments:    03/2008  Vaping Use   Vaping Use: Never used  Substance and Sexual Activity   Alcohol use: No   Drug use: No   Sexual activity: Not on  file    Comment: hysterectomy  Other Topics Concern   Not on file  Social History Narrative   Lives at home with her husband.   Right-handed.   Caffeine use: 20 ounces per day.   Social Determinants of Health   Financial Resource Strain: Medium Risk (03/06/2019)   Overall Financial Resource Strain (CARDIA)    Difficulty of Paying Living Expenses: Somewhat hard  Food Insecurity: No Food Insecurity (03/06/2019)   Hunger Vital Sign    Worried About Running Out of Food in the Last Year: Never true    Ran Out of Food in the Last Year: Never true  Transportation Needs: No Transportation Needs (03/06/2019)   PRAPARE - Hydrologist (Medical): No    Lack of Transportation (Non-Medical): No  Physical Activity: Inactive (03/06/2019)   Exercise Vital Sign    Days of Exercise per Week: 0 days    Minutes of Exercise per Session: 0 min  Stress: Stress Concern Present (03/06/2019)   Sunflower    Feeling of Stress : To some extent  Social Connections: Socially Integrated (03/06/2019)   Social Connection and Isolation Panel [NHANES]    Frequency of Communication with Friends and Family: Three times a week    Frequency of Social Gatherings with Friends and Family: Once a week    Attends Religious Services: More than 4 times per year    Active Member of [employer] or Organizations: Yes    Attends Music therapist: More than 4 times per year    Marital Status: Married  Human resources officer Violence: Not At Risk (03/06/2019)   Humiliation, Afraid, Rape, and Kick questionnaire    Fear of Current or Ex-Partner: No    Emotionally Abused: No    Physically Abused: No    Sexually Abused: No    Physical Exam: Vital signs in last 24 hours: _0  (!) 114/57   Pulse 85   Temp 97.7 F (36.5 C)   Ht _1  (1.651 m)   Wt 221 lb 3.2 oz (100.3 kg)   LMP  (LMP Unknown)   SpO2 96%   BMI 36.81 kg/m  GEN:  NAD EYE: Sclerae anicteric ENT: MMM CV: Non-tachycardic Pulm: CTA b/l GI: Soft, NT/ND NEURO:  Alert & Oriented x 3   Zenovia Jarred, MD [facility]  05/27/2022 10:56 AM

## 2022-05-27 NOTE — Op Note (Signed)
[facility] Patient Name: Erica Giles Procedure Date: 05/27/2022 10:51 AM MRN: [id number] Endoscopist: Jerene Bears , MD, [id number] Age: 66 Referring MD:  Date of Birth: 1955-07-26 Gender: Female Account #: [id number] Procedure:                Colonoscopy Indications:              High risk colon cancer surveillance: Personal                            history of non-advanced adenomas, Last colonoscopy:                            October 2015 (2 adenomas) Medicines:                Monitored Anesthesia Care Procedure:                Pre-Anesthesia Assessment:                           - Prior to the procedure, a History and Physical                            was performed, and patient medications and                            allergies were reviewed. The patient's tolerance of                            previous anesthesia was also reviewed. The risks                            and benefits of the procedure and the sedation                            options and risks were discussed with the patient.                            All questions were answered, and informed consent                            was obtained. Prior Anticoagulants: The patient has                            taken no anticoagulant or antiplatelet agents. ASA                            Grade Assessment: III - A patient with severe                            systemic disease. After reviewing the risks and                            benefits, the patient was deemed in satisfactory  condition to undergo the procedure.                           After obtaining informed consent, the colonoscope                            was passed under direct vision. Throughout the                            procedure, the patient's blood pressure, pulse, and                            oxygen saturations were monitored continuously. The                            CF [id number] #[id number] was introduced  through the anus                            and advanced to the cecum, identified by                            appendiceal orifice and ileocecal valve. The                            colonoscopy was performed without difficulty. The                            patient tolerated the procedure well. The quality                            of the bowel preparation was good. The ileocecal                            valve, appendiceal orifice, and rectum were                            photographed. Scope In: 11:17:19 AM Scope Out: 11:42:26 AM Scope Withdrawal Time: 0 hours 20 minutes 36 seconds  Total Procedure Duration: 0 hours 25 minutes 7 seconds  Findings:                 The digital rectal exam was normal.                           Two sessile polyps were found in the ascending                            colon. The polyps were 4 to 5 mm in size. These                            polyps were removed with a cold snare. Resection                            and retrieval were complete.  Three sessile polyps were found in the transverse                            colon. The polyps were 4 to 6 mm in size. These                            polyps were removed with a cold snare. Resection                            and retrieval were complete.                           A 1 mm polyp was found in the transverse colon. The                            polyp was sessile. The polyp was removed with a                            cold biopsy forceps. Resection and retrieval were                            complete.                           Two sessile polyps were found in the sigmoid colon.                            The polyps were 3 to 4 mm in size. These polyps                            were removed with a cold snare. Resection and                            retrieval were complete.                           Internal hemorrhoids were found during                             retroflexion. The hemorrhoids were medium-sized. Complications:            No immediate complications. Estimated Blood Loss:     Estimated blood loss was minimal. Impression:               - Two 4 to 5 mm polyps in the ascending colon,                            removed with a cold snare. Resected and retrieved.                           - Three 4 to 6 mm polyps in the transverse colon,  removed with a cold snare. Resected and retrieved.                           - One 1 mm polyp in the transverse colon, removed                            with a cold biopsy forceps. Resected and retrieved.                           - Two 3 to 4 mm polyps in the sigmoid colon,                            removed with a cold snare. Resected and retrieved.                           - Internal hemorrhoids. Recommendation:           - Patient has a contact number available for                            emergencies. The signs and symptoms of potential                            delayed complications were discussed with the                            patient. Return to normal activities tomorrow.                            Written discharge instructions were provided to the                            patient.                           - Resume previous diet.                           - Continue present medications.                           - Await pathology results.                           - Repeat colonoscopy is recommended for                            surveillance. The colonoscopy date will be                            determined after pathology results from today's                            exam become available for review. Jerene Bears, MD 05/27/2022 11:55:24 AM This report has been signed electronically.

## 2022-05-27 NOTE — Patient Instructions (Addendum)
RECOMMENDATIONS:  - Patient has a contact number available for emergencies. The signs and symptoms of potential delayed complications were discussed with the patient. Return to normal activities tomorrow. Written discharge instructions were provided to the patient. - Advance diet as tolerated. - Continue present medications. - Await pathology results. - Repeat colonoscopy is recommended for surveillance. The colonoscopy date will be determined after pathology results from today's exam become available for review.   YOU HAD AN ENDOSCOPIC PROCEDURE TODAY AT [facility]:   Refer to the procedure report that was given to you for any specific questions about what was found during the examination.  If the procedure report does not answer your questions, please call your gastroenterologist to clarify.  If you requested that your care partner not be given the details of your procedure findings, then the procedure report has been included in a sealed envelope for you to review at your convenience later.  YOU SHOULD EXPECT: Some feelings of bloating in the abdomen. Passage of more gas than usual.  Walking can help get rid of the air that was put into your GI tract during the procedure and reduce the bloating. If you had a lower endoscopy (such as a colonoscopy or flexible sigmoidoscopy) you may notice spotting of blood in your stool or on the toilet paper. If you underwent a bowel prep for your procedure, you may not have a normal bowel movement for a few days.  Please Note:  You might notice some irritation and congestion in your nose or some drainage.  This is from the oxygen used during your procedure.  There is no need for concern and it should clear up in a day or so.  SYMPTOMS TO REPORT IMMEDIATELY:  Following lower endoscopy (colonoscopy or flexible sigmoidoscopy):  Excessive amounts of blood in the stool  Significant tenderness or worsening of abdominal pains  Swelling of the  abdomen that is new, acute  Fever of 100F or higher  Following upper endoscopy (EGD)  Vomiting of blood or coffee ground material  New chest pain or pain under the shoulder blades  Painful or persistently difficult swallowing  New shortness of breath  Fever of 100F or higher  Black, tarry-looking stools  For urgent or emergent issues, a gastroenterologist can be reached at any hour by calling [phone]. Do not use MyChart messaging for urgent concerns.    DIET:  Follow a POST-DILATION DIET (see handout given to you by your recovery nurse): [place] 1:00pm, then you may have a CLEAR LIQUID DIET ONLY from 1:00pm to 2:00pm, then you may have a SOFT DIET for the rest of the day today starting at 2:00pm. You may then proceed to a regular diet tomorrow as tolerated.  Drink plenty of fluids but you should avoid alcoholic beverages for 24 hours.  MEDICATIONS: Continue present medications.  Please see handouts given to you by your recovery nurse: Post-dilation diet, hiatal hernia, esophageal stricture.  Thank you for allowing [place] to provide for your healthcare needs today.  ACTIVITY:  You should plan to take it easy for the rest of today and you should NOT DRIVE or use heavy machinery until tomorrow (because of the sedation medicines used during the test).    FOLLOW UP: Our staff will call the number listed on your records the next business day following your procedure.  We will call around 7:15- 8:00 am to check on you and address any questions or concerns that you  may have regarding the information given to you following your procedure. If we do not reach you, we will leave a message.     If any biopsies were taken you will be contacted by phone or by letter within the next 1-3 weeks.  Please call us at [phone] if you have not heard about the biopsies in 3 weeks.    SIGNATURES/CONFIDENTIALITY: You and/or your care partner have signed paperwork which will be  entered into your electronic medical record.  These signatures attest to the fact that that the information above on your After Visit Summary has been reviewed and is understood.  Full responsibility of the confidentiality of this discharge information lies with you and/or your care-partner.

## 2022-05-27 NOTE — Progress Notes (Signed)
Called to room to assist during endoscopic procedure.  Patient ID and intended procedure confirmed with present staff. Received instructions for my participation in the procedure from the performing physician.  

## 2022-05-27 NOTE — Op Note (Signed)
[facility] Patient Name: Erica Giles Procedure Date: 05/27/2022 11:01 AM MRN: [id number] Endoscopist: Jerene Bears , MD, [id number] Age: 66 Referring MD:  Date of Birth: 19-Oct-1955 Gender: Female Account #: [id number] Procedure:                Upper GI endoscopy Indications:              Dysphagia responsive to dilation in 2015,                            Gastro-esophageal reflux disease well controlled on                            once daily pantoprazole 40 mg Medicines:                Monitored Anesthesia Care Procedure:                Pre-Anesthesia Assessment:                           - Prior to the procedure, a History and Physical                            was performed, and patient medications and                            allergies were reviewed. The patient's tolerance of                            previous anesthesia was also reviewed. The risks                            and benefits of the procedure and the sedation                            options and risks were discussed with the patient.                            All questions were answered, and informed consent                            was obtained. Prior Anticoagulants: The patient has                            taken no anticoagulant or antiplatelet agents. ASA                            Grade Assessment: III - A patient with severe                            systemic disease. After reviewing the risks and                            benefits, the patient was deemed in satisfactory  condition to undergo the procedure.                           After obtaining informed consent, the endoscope was                            passed under direct vision. Throughout the                            procedure, the patient's blood pressure, pulse, and                            oxygen saturations were monitored continuously. The                            GIF [id number] #[id number] was  introduced through the                            mouth, and advanced to the second part of duodenum. Scope In: Scope Out: Findings:                 Diffuse mild inflammation characterized by                            congestion (edema) and granularity was found in the                            middle third of the esophagus and in the lower                            third of the esophagus. Biopsies were taken with a                            cold forceps for histology.                           A low-grade of narrowing Schatzki ring was found at                            the gastroesophageal junction. A TTS dilator was                            passed through the scope. Dilation with an 18-19-20                            mm balloon dilator was performed to 19 mm. The                            dilation site was examined and showed moderate                            mucosal disruption.  A 3 cm hiatal hernia was present.                           The entire examined stomach was normal.                           The examined duodenum was normal. Complications:            No immediate complications. Estimated Blood Loss:     Estimated blood loss was minimal. Impression:               - Esophageal mucosal changes were present,                            including congestion (edema) and granularity.                            Findings are suggestive of reflux inflammation.                            Biopsied.                           - Low-grade of narrowing Schatzki ring. Dilated to                            19 mm with balloon.                           - 3 cm hiatal hernia.                           - Normal stomach.                           - Normal examined duodenum. Recommendation:           - Patient has a contact number available for                            emergencies. The signs and symptoms of potential                            delayed  complications were discussed with the                            patient. Return to normal activities tomorrow.                            Written discharge instructions were provided to the                            patient.                           - Advance diet as tolerated.                           -  Continue present medications.                           - Await pathology results. Jerene Bears, MD 05/27/2022 11:49:22 AM This report has been signed electronically.

## 2022-05-27 NOTE — Progress Notes (Signed)
Pt's states no medical or surgical changes since previsit or office visit. 

## 2022-05-28 ENCOUNTER — Telehealth: Payer: Self-pay

## 2022-05-28 NOTE — Telephone Encounter (Signed)
  Follow up Call-     05/27/2022   10:07 AM  Call back number  Post procedure Call Back phone  # [phone]  Permission to leave phone message Yes     Left message

## 2022-06-04 ENCOUNTER — Encounter: Payer: Self-pay | Admitting: Internal Medicine

## 2022-07-20 ENCOUNTER — Other Ambulatory Visit: Payer: Self-pay | Admitting: Hematology

## 2022-07-20 DIAGNOSIS — Z170 Estrogen receptor positive status [ER+]: Secondary | ICD-10-CM

## 2022-07-30 LAB — CBC
Absolute Baso #: 0.07 10*3/uL (ref 0.00–0.20)
Absolute Eos #: 0.24 10*3/uL (ref 0.00–0.50)
Absolute Lymph #: 3.34 10*3/uL (ref 1.00–4.00)
Absolute Mono #: 0.5 10*3/uL (ref 0.20–1.00)
Absolute Neut #: 3.83 10*3/uL (ref 1.50–7.50)
Basophils %: 0.9 %
Eosinophils %: 3 %
Hematocrit: 45.1 % — ABNORMAL HIGH (ref 34.0–45.0)
Hemoglobin: 15 g/dL (ref 11.5–15.5)
Lymphocyte %: 41.8 %
MCH: 29.9 pg (ref 25.0–33.0)
MCHC: 33.3 g/dL (ref 31.0–36.0)
MCV: 90 fL (ref 80.0–99.0)
MPV: 9.8 fL (ref 9.3–13.0)
Monocytes: 6.3 %
Neutrophils %: 47.7 %
Platelets: 323 10*3/uL (ref 130–400)
RBC: 5.01 M/uL (ref 3.80–5.40)
RDW: 15.3 % — ABNORMAL HIGH (ref 11.5–15.0)
WBC: 8 10*3/uL (ref 3.5–11.0)

## 2022-07-30 LAB — BASIC METABOLIC PANEL
Anion Gap: 12 meq/L (ref 7.0–16.0)
BUN: 16 mg/dL (ref 8–23)
CO2: 28 meq/L (ref 19–31)
Calcium: 9.3 mg/dL (ref 8.5–10.5)
Chloride: 103 meq/L (ref 95–107)
Creatinine: 0.75 mg/dL (ref 0.60–1.30)
EGFR IF NonAfrican American: 88 mL/min/{1.73_m2} (ref >60)
Glucose: 85 mg/dL (ref 70–99)
Potassium: 4 meq/L (ref 3.5–5.4)
Sodium: 143 meq/L (ref 133–146)

## 2022-07-30 LAB — LIPID PANEL W/ REFLEX DIRECT LDL
Chol/HDL Ratio: 4.5 RATIO — ABNORMAL HIGH (ref <4.44)
Cholesterol: 201 mg/dL — ABNORMAL HIGH (ref <200)
HDL: 45 mg/dL (ref >39)
LDL Calculated: 112 mg/dL — ABNORMAL HIGH (ref <100)
LDL/HDL Ratio: 2.5 RATIO (ref <3.22)
Triglycerides: 222 mg/dL — ABNORMAL HIGH (ref <149)
VLDL Cholesterol Calculated: 44 mg/dL — ABNORMAL HIGH (ref <30)

## 2022-07-30 LAB — HEPATIC FUNCTION PANEL
ALT: 16 U/L (ref 5–40)
AST: 22 U/L (ref 9–40)
Albumin: 4.7 g/dL (ref 3.5–5.2)
Alk Phosphatase: 98 U/L (ref 40–140)
Bilirubin, Direct: <0.2 mg/dL (ref 0.0–0.3)
Total Bilirubin: 0.2 mg/dL (ref <=1.2)
Total Protein: 8 g/dL (ref 6.1–8.3)

## 2022-07-30 LAB — HIV 1/2 AG/AB COMBO: HIV 1,2 Combo Antigen/Antibody: NONREACTIVE

## 2022-07-30 LAB — HEPATITIS C ANTIBODY: Hepatitis C Ab: NONREACTIVE

## 2022-08-02 ENCOUNTER — Telehealth

## 2022-08-02 MED ORDER — LISINOPRIL-HYDROCHLOROTHIAZIDE 20-25 MG PO TABS
20-25 | ORAL_TABLET | Freq: Every day | ORAL | 0 refills | Status: AC
Start: 2022-08-02 — End: ?

## 2022-08-02 NOTE — Telephone Encounter (Signed)
Patient requesting refill lisinopril-hydrochlorothiazide 20/25 mg 1 daily.    Patient scheduled for wellness 08/25/22.  Will be out of medication prior to that visit.    [facility]

## 2022-08-25 ENCOUNTER — Encounter: Payer: PRIVATE HEALTH INSURANCE | Attending: Family Medicine | Primary: Family Medicine

## 2022-08-25 NOTE — Progress Notes (Incomplete)
++++

## 2022-10-06 ENCOUNTER — Inpatient Hospital Stay: Payer: Medicare Other | Attending: Hematology

## 2022-10-06 DIAGNOSIS — Z79811 Long term (current) use of aromatase inhibitors: Secondary | ICD-10-CM | POA: Diagnosis not present

## 2022-10-06 DIAGNOSIS — Z87891 Personal history of nicotine dependence: Secondary | ICD-10-CM | POA: Insufficient documentation

## 2022-10-06 DIAGNOSIS — Z170 Estrogen receptor positive status [ER+]: Secondary | ICD-10-CM | POA: Diagnosis not present

## 2022-10-06 DIAGNOSIS — R079 Chest pain, unspecified: Secondary | ICD-10-CM | POA: Insufficient documentation

## 2022-10-06 DIAGNOSIS — R918 Other nonspecific abnormal finding of lung field: Secondary | ICD-10-CM | POA: Diagnosis not present

## 2022-10-06 DIAGNOSIS — D509 Iron deficiency anemia, unspecified: Secondary | ICD-10-CM | POA: Insufficient documentation

## 2022-10-06 DIAGNOSIS — E559 Vitamin D deficiency, unspecified: Secondary | ICD-10-CM

## 2022-10-06 DIAGNOSIS — Z803 Family history of malignant neoplasm of breast: Secondary | ICD-10-CM | POA: Insufficient documentation

## 2022-10-06 DIAGNOSIS — C50412 Malignant neoplasm of upper-outer quadrant of left female breast: Secondary | ICD-10-CM | POA: Diagnosis present

## 2022-10-06 DIAGNOSIS — M8580 Other specified disorders of bone density and structure, unspecified site: Secondary | ICD-10-CM | POA: Diagnosis not present

## 2022-10-06 DIAGNOSIS — D649 Anemia, unspecified: Secondary | ICD-10-CM

## 2022-10-06 DIAGNOSIS — Z923 Personal history of irradiation: Secondary | ICD-10-CM | POA: Insufficient documentation

## 2022-10-06 LAB — COMPREHENSIVE METABOLIC PANEL
ALT: 21 U/L (ref 0–44)
AST: 22 U/L (ref 15–41)
Albumin: 3.5 g/dL (ref 3.5–5.0)
Alkaline Phosphatase: 98 U/L (ref 38–126)
Anion gap: 10 (ref 5–15)
BUN: 20 mg/dL (ref 8–23)
CO2: 24 mmol/L (ref 22–32)
Calcium: 9.3 mg/dL (ref 8.9–10.3)
Chloride: 102 mmol/L (ref 98–111)
Creatinine, Ser: 1.08 mg/dL — ABNORMAL HIGH (ref 0.44–1.00)
GFR, Estimated: 57 mL/min — ABNORMAL LOW (ref >60)
Glucose, Bld: 235 mg/dL — ABNORMAL HIGH (ref 70–99)
Potassium: 4.5 mmol/L (ref 3.5–5.1)
Sodium: 136 mmol/L (ref 135–145)
Total Bilirubin: 0.5 mg/dL (ref 0.3–1.2)
Total Protein: 6.5 g/dL (ref 6.5–8.1)

## 2022-10-06 LAB — CBC WITH DIFFERENTIAL/PLATELET
Abs Immature Granulocytes: 0.05 10*3/uL (ref 0.00–0.07)
Basophils Absolute: 0 10*3/uL (ref 0.0–0.1)
Basophils Relative: 1 %
Eosinophils Absolute: 0.1 10*3/uL (ref 0.0–0.5)
Eosinophils Relative: 1 %
HCT: 36 % (ref 36.0–46.0)
Hemoglobin: 11.6 g/dL — ABNORMAL LOW (ref 12.0–15.0)
Immature Granulocytes: 1 %
Lymphocytes Relative: 26 %
Lymphs Abs: 1.7 10*3/uL (ref 0.7–4.0)
MCH: 30.2 pg (ref 26.0–34.0)
MCHC: 32.2 g/dL (ref 30.0–36.0)
MCV: 93.8 fL (ref 80.0–100.0)
Monocytes Absolute: 0.5 10*3/uL (ref 0.1–1.0)
Monocytes Relative: 7 %
Neutro Abs: 4.2 10*3/uL (ref 1.7–7.7)
Neutrophils Relative %: 64 %
Platelets: 218 10*3/uL (ref 150–400)
RBC: 3.84 MIL/uL — ABNORMAL LOW (ref 3.87–5.11)
RDW: 14.2 % (ref 11.5–15.5)
WBC: 6.5 10*3/uL (ref 4.0–10.5)
nRBC: 0 % (ref 0.0–0.2)

## 2022-10-06 LAB — FERRITIN: Ferritin: 27 ng/mL (ref 11–307)

## 2022-10-06 LAB — IRON AND TIBC
Iron: 69 ug/dL (ref 28–170)
Saturation Ratios: 19 % (ref 10.4–31.8)
TIBC: 357 ug/dL (ref 250–450)
UIBC: 288 ug/dL

## 2022-10-06 LAB — VITAMIN D 25 HYDROXY (VIT D DEFICIENCY, FRACTURES): Vit D, 25-Hydroxy: 36.1 ng/mL (ref 30–100)

## 2022-10-11 ENCOUNTER — Other Ambulatory Visit: Payer: Medicare Other

## 2022-10-13 ENCOUNTER — Inpatient Hospital Stay: Payer: Medicare Other | Admitting: Hematology

## 2022-10-13 NOTE — Progress Notes (Unsigned)
[facility] [address], [place] [address]   CLINIC:  Medical Oncology/Hematology  PCP:  Practice, [facility] [address] [place] / [place] [address] [phone]   REASON FOR VISIT:  Follow-up for lung nodules with history of breast cancer   PRIOR THERAPY:  Left lumpectomy on 03/27/2019 XRT completed on 07/18/2019   NGS Results: Not done   CURRENT THERAPY: Anastrozole  BRIEF ONCOLOGIC HISTORY:   Oncology History  Breast cancer of upper-outer quadrant of left female breast (HCC)  03/06/2019 Initial Diagnosis   Breast cancer of upper-outer quadrant of left female breast (HCC)   04/24/2019 Cancer Staging   Staging form: Breast, AJCC 8th Edition - Clinical: Stage IB (cT1c, cN1, cM0, G2, ER+, PR+, HER2-) - Signed by Doreatha Massed, MD on 04/24/2019   07/11/2019 Genetic Testing   Negative genetic testing on the common hereditary cancer panel.  The Common Hereditary Gene Panel offered by [facility] includes sequencing and/or deletion duplication testing of the following 48 genes: APC, ATM, AXIN2, BARD1, BMPR1A, BRCA1, BRCA2, BRIP1, CDH1, CDK4, CDKN2A (p14ARF), CDKN2A (p16INK4a), CHEK2, CTNNA1, DICER1, EPCAM (Deletion/duplication testing only), GREM1 (promoter region deletion/duplication testing only), KIT, MEN1, MLH1, MSH2, MSH3, MSH6, MUTYH, NBN, NF1, NHTL1, PALB2, PDGFRA, PMS2, POLD1, POLE, PTEN, RAD50, RAD51C, RAD51D, RNF43, SDHB, SDHC, SDHD, SMAD4, SMARCA4. STK11, TP53, TSC1, TSC2, and VHL.  The following genes were evaluated for sequence changes only: SDHA and HOXB13 c.251G>A variant only. The report date is July 11, 2019.     CANCER STAGING: Cancer Staging  Breast cancer of upper-outer quadrant of left female breast (HCC) Staging form: Breast, AJCC 8th Edition - Clinical: Stage IB (cT1c, cN1, cM0, G2, ER+, PR+, HER2-) - Signed by Doreatha Massed, MD on 04/24/2019   INTERVAL HISTORY:   Ms. Chasady C Lindh, a 67 y.o. female, returns for routine  follow-up of her lung nodules and history of breast cancer. Shayana was last seen on 04/19/2022 by Dr. Ellin Saba.   At today's visit, she  reports feeling fair.  She has not noticed any new breast lumps.   She has no new abdominal pain, headaches, seizures, or focal neurologic deficits.  She denies any new dyspnea, cough, hemoptysis, or voice changes.  She denies any recent infections or antibiotic use. No B symptoms such as fever, chills, unintentional weight loss.  She does report some intermittent dull ache in her central chest, usually when she is "up and doing something," denies any radiation or associated symptoms.  She reports bilateral leg swelling for the past 2 months.  She has ongoing musculoskeletal pain that she attributes to arthritis.  She is taking pregabalin for her neuropathy.  She does also report some hot flashes and mild night sweats, reportedly "tolerable."  She had been taking iron supplement daily with stool softener for the past 6 months.  Reports dark stools since starting oral iron, but denies any frank melena or hematochezia.  She has had some fatigue and restless legs.    She reports 40% energy and 100% appetite.  She is maintaining stable weight at this time.   ASSESSMENT & PLAN:  1.  Stage Ib (T1CN1) left breast IDC: - Initial diagnosis September 2020, with a screening mammogram (02/16/2019) showing suspicious 0.9 cm mass in the 230 to 3 o'clock position of the left breast - Ultrasound-guided biopsy (02/20/2019): Grade 2 invasive ductal carcinoma, positive for ER/PR and negative for HER2. - Lumpectomy and sentinel lymph node biopsy by Dr. Derrell Lolling on 03/17/2019.  Pathology showed  grade 2 IDC, 1.1 cm, negative margins, negative LVSI/perineural invasion, 1/1 lymph node positive for metastatic carcinoma measuring 2.7 mm. PT1CPN1A.  ER 100%, PR 100%, Ki-67 2%, HER2 2+, negative by FISH.  - Oncotype DX results showed recurrence score of 8.  No apparent benefit from chemotherapy.   9-year breast cancer specific survival is 98%. - Completed radiation therapy on 07/18/2019 - She started anastrozole on 04/24/2019, with goal of at least 5 years of therapy.  She is continuing anastrozole without any major problems apart from some increased arthralgia. - Most recent mammogram, diagnostic (03/02/2022): BI-RADS Category 2, benign.   - Physical examination today did not reveal any palpable masses.    She has some mild edema and tenderness of left lateral breast, near lumpectomy scar. - No red flag symptoms of recurrence. - Most recent labs (10/06/2022): Overall unremarkable CBC, normal LFTs.   - PLAN: Annual screening mammogram next due in September 2024. - Repeat labs and RTC in 6 months for office visit and physical exam.    - Continue anastrozole.  We will send for BCI testing   2.  Bilateral lung nodules: - She had a fall on 08/22/2021 with left lower rib fractures. - CT chest with contrast (09/10/2021): Multiple bilateral lung nodules, largest irregular nodule of the posterior inferior right upper lobe measuring 1.7 x 1.0 cm, 0.4 cm nodule in the left apex, right lower lobe 0.4 cm lung nodule. - PET scan (10/01/2021) did not show uptake in the 1.7 cm irregular nodular lesion in the right upper lobe.  No lymphadenopathy. - CT chest with contrast (04/12/2022): Stable 1.7 cm multilobular nodule in the posterior right upper lobe.  Other scattered tiny bilateral lung nodules are unchanged when compared to scan from 09/10/2021.  No mediastinal or hilar adenopathy. -Denies any dyspnea, cough, hemoptysis, or voice changes.  No B symptoms or unintentional weight loss. - PLAN: Will check CT chest without contrast in November 2024 to ensure stability.  3.  Iron deficiency anemia, mild  - Labs from 10/06/2022 showed Hgb 11.6/MCV 93.8, ferritin 27, iron saturation 19% - EGD/colonoscopy (05/12/2022): Colonoscopy revealed multiple polyps (tubular adenoma) and internal hemorrhoids.  Endoscopy revealed  esophageal inflammation, Schatzki ring, 3 cm hiatal hernia. - No rectal bleeding or melena. - Admits to fatigue. - She has been taking iron tablet daily x 6 months without improvement. - PLAN: Recommend IV Feraheme x 2.  We discussed risk of allergic reaction and potential side effects.  Patient is agreeable. - Repeat CBC and iron panel at follow-up in 6 months.   4.  Bone health: - DEXA scan on 04/19/2019 shows T score of -1. - DEXA scan on 04/20/2021 shows T score of -1.3, mild osteopenia - Started on Fosamax in September 2022, but patient stopped taking this in 2023.   - Most recent vitamin D (10/06/2022) normal at 36.10 - PLAN: Continue weekly Fosamax 35 mg - Continue calcium, vitamin D, and weightbearing exercises for bone loss associated with breast cancer therapy. - DEXA scan due November 2024.  If any worsening, will consider reintroducing bisphosphonates.  5.  Chest pain - Reports some intermittent dull ache in her central chest, usually when she is "up and doing something," denies any radiation or associated symptoms.  She reports bilateral leg swelling for the past 2 months. - CT chest (04/12/2022) did show some coronary artery calcifications, as well as mild atherosclerotic calcification in the wall of the thoracic aorta - PLAN: Referral placed to Dr. Shari Prows in [place] (  patient preference).  Patient provided with education on when to seek emergency medical attention.   6.  Other history: - She works at [employer]  She quit smoking in 2009 after smoking 1 PPD x 20 years. - Paternal aunts x 3 had breast cancer.  Maternal aunt with breast cancer.  [facility] germline mutation testing was negative.  PLAN SUMMARY: >> IV Feraheme x 2 >> Mammogram (September 2024) >> CT chest without contrast (November 2024) >> DEXA bone density scan (November 2024) >> Labs in 6 months = CBC/D, CMP, vitamin D, ferritin, iron/TIBC >> OFFICE visit in 6 months (1 week after labs/imaging as  above))  ** Will also speak with Oncology Navigator to send for BCI testing    REVIEW OF SYSTEMS:   Review of Systems  Constitutional:  Positive for fatigue. Negative for appetite change, chills, diaphoresis, fever and unexpected weight change.  HENT:   Negative for lump/mass and nosebleeds.   Eyes:  Negative for eye problems.  Respiratory:  Negative for cough, hemoptysis and shortness of breath.   Cardiovascular:  Positive for chest pain (Intermittent, none today) and leg swelling. Negative for palpitations.  Gastrointestinal:  Negative for abdominal pain, blood in stool, constipation, diarrhea, nausea and vomiting.  Genitourinary:  Negative for hematuria.   Skin: Negative.   Neurological:  Positive for dizziness and numbness (Neuropathy). Negative for headaches and light-headedness.  Hematological:  Does not bruise/bleed easily.  Psychiatric/Behavioral:  Positive for depression.     PHYSICAL EXAM:   Performance status (ECOG): 1 - Symptomatic but completely ambulatory  There were no vitals filed for this visit. Wt Readings from Last 3 Encounters:  05/27/22 221 lb 3.2 oz (100.3 kg)  05/12/22 221 lb 3.2 oz (100.3 kg)  04/19/22 216 lb (98 kg)   Physical Exam Constitutional:      Appearance: Normal appearance. She is obese.  HENT:     Head: Normocephalic and atraumatic.     Mouth/Throat:     Mouth: Mucous membranes are moist.  Eyes:     Extraocular Movements: Extraocular movements intact.     Pupils: Pupils are equal, round, and reactive to light.  Cardiovascular:     Rate and Rhythm: Normal rate and regular rhythm.     Pulses: Normal pulses.     Heart sounds: Normal heart sounds.  Pulmonary:     Effort: Pulmonary effort is normal.     Breath sounds: Normal breath sounds.  Chest:  Breasts:    Right: Normal.     Left: No swelling, inverted nipple, mass, nipple discharge, skin change or tenderness.       Comments: Mild edema and tenderness of left lateral breast, near  lumpectomy scar.  No discrete nodules or masses in either breast. Abdominal:     General: Bowel sounds are normal.     Palpations: Abdomen is soft.     Tenderness: There is no abdominal tenderness.  Musculoskeletal:        General: No swelling.     Right lower leg: Edema present.     Left lower leg: Edema present.     Comments: 1+ edema of left calf/pretibial region. 1+ edema of right foot.   Lymphadenopathy:     Cervical: No cervical adenopathy.     Upper Body:     Right upper body: No supraclavicular, axillary or pectoral adenopathy.     Left upper body: No supraclavicular, axillary or pectoral adenopathy.  Skin:    General: Skin is warm and dry.  Neurological:  General: No focal deficit present.     Mental Status: She is alert and oriented to person, place, and time.  Psychiatric:        Mood and Affect: Mood normal.        Behavior: Behavior normal.     PAST MEDICAL/SURGICAL HISTORY:  Past Medical History:  Diagnosis Date   Anxiety    Arthritis    Breast cancer (HCC)    left   Cataract    BEGINNING STAGES   CTS (carpal tunnel syndrome)    Depression    Diabetes mellitus    ORAL MED   Family history of breast cancer    GERD (gastroesophageal reflux disease)    Helicobacter pylori gastritis    Hiatal hernia    HNP (herniated nucleus pulposus), lumbar    PT TRYING TO AVOID LUMBAR SURGERY--STATES PAIN IN HER BACK AND SOMETIMES DOWN ONE OR THE OTHER LEG   Hyperlipidemia    Hypertension    Osteopenia    Personal history of radiation therapy    Plantar fasciitis    BILATERAL   Schatzki's ring    Shortness of breath    WITH EXERTION-PT RELATES TO HER WEIGHT   Sleep apnea    MODERATE- PER SLEEP STUDY 06/2010--PT USES CPAP-SETTING IS 11   Past Surgical History:  Procedure Laterality Date   ABDOMINAL HYSTERECTOMY     BREAST LUMPECTOMY     BREAST LUMPECTOMY WITH RADIOACTIVE SEED AND SENTINEL LYMPH NODE BIOPSY Left 03/27/2019   Procedure: LEFT BREAST LUMPECTOMY  WITH RADIOACTIVE SEED AND LEFT AXILLARY DEEP SENTINEL LYMPH NODE BIOPSY INJECT BLUE DYE;  Surgeon: Claud Kelp, MD;  Location: [facility] OR;  Service: General;  Laterality: Left;   CARPAL TUNNEL RELEASE     BILATERAL   CHOLECYSTECTOMY     CYST REMOVED     LEFT THUMB AND RT MIDDLE FINGER   TONSILLECTOMY     TRIGGER FINGER REPAIR     UPPER GASTROINTESTINAL ENDOSCOPY      SOCIAL HISTORY:  Social History   Socioeconomic History   Marital status: Married    Spouse name: Not on file   Number of children: 2   Years of education: GED   Highest education level: Not on file  Occupational History   Occupation: Radiographer, therapeutic: [employer]  Tobacco Use   Smoking status: Former    Packs/day: 1.00    Years: 25.00    Additional pack years: 0.00    Total pack years: 25.00    Types: Cigarettes    Quit date: 03/05/2008    Years since quitting: 14.6    Passive exposure: Current (HUSBAND SMOKES)   Smokeless tobacco: Never   Tobacco comments:    03/2008  Vaping Use   Vaping Use: Never used  Substance and Sexual Activity   Alcohol use: No   Drug use: No   Sexual activity: Not on file    Comment: hysterectomy  Other Topics Concern   Not on file  Social History Narrative   Lives at home with her husband.   Right-handed.   Caffeine use: 20 ounces per day.   Social Determinants of Health   Financial Resource Strain: Medium Risk (03/06/2019)   Overall Financial Resource Strain (CARDIA)    Difficulty of Paying Living Expenses: Somewhat hard  Food Insecurity: No Food Insecurity (03/06/2019)   Hunger Vital Sign    Worried About Running Out of Food in the Last Year: Never true    Ran  Out of Food in the Last Year: Never true  Transportation Needs: No Transportation Needs (03/06/2019)   PRAPARE - Administrator, Civil Service (Medical): No    Lack of Transportation (Non-Medical): No  Physical Activity: Inactive (03/06/2019)   Exercise Vital Sign    Days of Exercise  per Week: 0 days    Minutes of Exercise per Session: 0 min  Stress: Stress Concern Present (03/06/2019)   [employer] of Occupational Health - Occupational Stress Questionnaire    Feeling of Stress : To some extent  Social Connections: Socially Integrated (03/06/2019)   Social Connection and Isolation Panel [NHANES]    Frequency of Communication with Friends and Family: Three times a week    Frequency of Social Gatherings with Friends and Family: Once a week    Attends Religious Services: More than 4 times per year    Active Member of [employer] or Organizations: Yes    Attends Engineer, structural: More than 4 times per year    Marital Status: Married  Catering manager Violence: Not At Risk (03/06/2019)   Humiliation, Afraid, Rape, and Kick questionnaire    Fear of Current or Ex-Partner: No    Emotionally Abused: No    Physically Abused: No    Sexually Abused: No    FAMILY HISTORY:  Family History  Problem Relation Age of Onset   Diabetes Mother    Kidney disease Mother    Dementia Mother    Diabetes Father    Kidney Stones Sister    Other Sister 7       murdered   Other Sister 7       murdered   Diabetes Brother    Kidney Stones Brother    Other Brother        infant death   Kidney cancer Maternal Aunt 22   Breast cancer Maternal Aunt        dx over 28   Breast cancer Paternal Aunt 86   Breast cancer Paternal Aunt 77   Breast cancer Paternal Aunt        dx over 36   Other Daughter        infanct death   Scoliosis Son    Colon cancer Neg Hx    Colon polyps Neg Hx    Esophageal cancer Neg Hx    Ulcerative colitis Neg Hx    Stomach cancer Neg Hx    Rectal cancer Neg Hx     CURRENT MEDICATIONS:  Current Outpatient Medications  Medication Sig Dispense Refill   alendronate (FOSAMAX) 35 MG tablet Take 1 tablet (35 mg total) by mouth every 7 (seven) days. Take with a full glass of water on an empty stomach. 5 tablet 6   anastrozole (ARIMIDEX) 1 MG tablet  TAKE ONE TABLET BY MOUTH ONCE DAILY 30 tablet 2   atorvastatin (LIPITOR) 80 MG tablet Take 80 mg by mouth daily.     Blood Glucose Calibration (CONTOUR NEXT CONTROL) Normal SOLN as directed per package instructions In Vitro (Patient not taking: Reported on 05/12/2022)     Blood Glucose Monitoring Suppl (CONTOUR NEXT MONITOR) w/Device KIT as directed per package instructions twice a day (Patient not taking: Reported on 05/12/2022)     cholecalciferol (VITAMIN D3) 25 MCG (1000 UT) tablet Take 2,000-4,000 Units by mouth See admin instructions. 2000 mcg daily, except once a week, take 4000 mcg daily (Patient not taking: Reported on 05/12/2022)     cyanocobalamin (,VITAMIN B-12,) 1000 MCG/ML  injection Inject 1,000 mcg into the muscle every 30 (thirty) days.      gabapentin (NEURONTIN) 300 MG capsule Take 900 mg by mouth 3 (three) times daily as needed (pain).      glucose blood (CONTOUR NEXT TEST) test strip as directed per package instructions In Vitro twice a day (Patient not taking: Reported on 05/12/2022)     hydrOXYzine (ATARAX) 10 MG tablet hydroxyzine HCl 10 mg tablet  TAKE ONE TABLET BY MOUTH EVERY 8 HOURS AS NEEDED FOR ANXIETY     ibuprofen (ADVIL) 800 MG tablet as needed.      insulin NPH Human (NOVOLIN N) 100 UNIT/ML injection 140 UNITS AM, 100 UN ITS IN P.M     insulin NPH-regular Human (NOVOLIN 70/30) (70-30) 100 UNIT/ML injection      lidocaine (LIDODERM) 5 % Place 1 patch onto the skin daily. Remove & Discard patch within 12 hours or as directed by MD (Patient not taking: Reported on 05/12/2022) 30 patch 0   lisinopril (PRINIVIL,ZESTRIL) 20 MG tablet Take 20 mg by mouth at bedtime.     METFORMIN HCL ER PO Take 1,000 mg by mouth 2 (two) times daily with a meal.     ondansetron (ZOFRAN) 4 MG tablet TAKE 30-GO MINUTES PRIOR TO EACH PREP DOSE 4 tablet 0   pantoprazole (PROTONIX) 40 MG tablet Take 1 tablet (40 mg total) by mouth 2 (two) times daily. (Patient taking differently: Take 40 mg by mouth  daily.) 60 tablet 11   SYRINGE-NEEDLE, DISP, 3 ML (B-D 3CC LUER-LOK SYR 25GX5/8") 25G X 5/8" 3 ML MISC BD Luer-Lok Syringe 3 mL 25 x 5/8"  USE TO INJECT VITAMIN B12 ONCE A MONTH.     trolamine salicylate (ASPERCREME) 10 % cream Apply 1 Application topically as needed for muscle pain.     Current Facility-Administered Medications  Medication Dose Route Frequency Provider Last Rate Last Admin   0.9 %  sodium chloride infusion  500 mL Intravenous Continuous Pyrtle, Carie Caddy, MD        ALLERGIES:  Allergies  Allergen Reactions   Celecoxib Other (See Comments)   Exenatide     Other reaction(s): knots at injection sites for months   Percocet [Oxycodone-Acetaminophen] Itching    PT CAN TAKE WITH BENADRYL   Acetaminophen Rash    Other reaction(s): Other (See Comments)   Hydrocodone Itching    PT WOULD TAKE WITH BENADRYL PT WOULD TAKE WITH BENADRYL   Oxycodone Hcl Rash   Oxycodone-Acetaminophen Itching    PT CAN TAKE WITH BENADRYL    LABORATORY DATA:  I have reviewed the labs as listed.     Latest Ref Rng & Units 10/06/2022    9:32 AM 04/12/2022    8:40 AM 05/07/2021    1:02 PM  CBC  WBC 4.0 - 10.5 K/uL 6.5  5.5  7.7   Hemoglobin 12.0 - 15.0 g/dL 78.2  95.6  21.3   Hematocrit 36.0 - 46.0 % 36.0  33.0  38.0   Platelets 150 - 400 K/uL 218  195  228       Latest Ref Rng & Units 10/06/2022    9:32 AM 04/12/2022    8:40 AM 09/10/2021   10:10 AM  CMP  Glucose 70 - 99 mg/dL 086  578    BUN 8 - 23 mg/dL 20  19    Creatinine 4.69 - 1.00 mg/dL 6.29  5.28  4.13   Sodium 135 - 145 mmol/L 136  137  Potassium 3.5 - 5.1 mmol/L 4.5  4.1    Chloride 98 - 111 mmol/L 102  103    CO2 22 - 32 mmol/L 24  26    Calcium 8.9 - 10.3 mg/dL 9.3  8.9    Total Protein 6.5 - 8.1 g/dL 6.5  6.2    Total Bilirubin 0.3 - 1.2 mg/dL 0.5  0.5    Alkaline Phos 38 - 126 U/L 98  96    AST 15 - 41 U/L 22  27    ALT 0 - 44 U/L 21  30      DIAGNOSTIC IMAGING:  I have independently reviewed the scans and discussed  with the patient. No results found.   WRAP UP:  All questions were answered. The patient knows to call the clinic with any problems, questions or concerns.  Medical decision making: Moderate  Time spent on visit: I spent 20 minutes counseling the patient face to face. The total time spent in the appointment was 30 minutes and more than 50% was on counseling.  Carnella Guadalajara, PA-C  10/14/2022 11:01 AM

## 2022-10-14 ENCOUNTER — Inpatient Hospital Stay (HOSPITAL_BASED_OUTPATIENT_CLINIC_OR_DEPARTMENT_OTHER): Payer: Medicare Other | Admitting: Physician Assistant

## 2022-10-14 ENCOUNTER — Other Ambulatory Visit: Payer: Self-pay

## 2022-10-14 VITALS — BP 134/54 | HR 81 | Temp 99.2°F | Resp 17 | Ht 65.0 in | Wt 236.0 lb

## 2022-10-14 DIAGNOSIS — D500 Iron deficiency anemia secondary to blood loss (chronic): Secondary | ICD-10-CM | POA: Insufficient documentation

## 2022-10-14 DIAGNOSIS — I2510 Atherosclerotic heart disease of native coronary artery without angina pectoris: Secondary | ICD-10-CM | POA: Diagnosis not present

## 2022-10-14 DIAGNOSIS — C50412 Malignant neoplasm of upper-outer quadrant of left female breast: Secondary | ICD-10-CM | POA: Diagnosis not present

## 2022-10-14 DIAGNOSIS — M8589 Other specified disorders of bone density and structure, multiple sites: Secondary | ICD-10-CM | POA: Diagnosis not present

## 2022-10-14 DIAGNOSIS — Z170 Estrogen receptor positive status [ER+]: Secondary | ICD-10-CM

## 2022-10-14 DIAGNOSIS — Z1231 Encounter for screening mammogram for malignant neoplasm of breast: Secondary | ICD-10-CM

## 2022-10-14 DIAGNOSIS — R918 Other nonspecific abnormal finding of lung field: Secondary | ICD-10-CM

## 2022-10-14 NOTE — Patient Instructions (Signed)
[facility] at [facility] **VISIT SUMMARY & IMPORTANT INSTRUCTIONS **   You were seen today by Rojelio Brenner PA-C for your follow-up visit.    HISTORY OF BREAST CANCER There were no signs of recurrent breast cancer at your appointment today. Continue to take anastrozole daily. We will check mammogram in September 2024.  LUNG NODULES We will check CT chest in November 2024  IRON DEFICIENCY We will schedule you for IV iron x 2 doses. Please see the attached handout for additional information about potential side effects.  OSTEOPENIA Continue taking calcium and vitamin D supplements. We will check bone density scan in November 2024.  CHEST PAIN As we discussed, chest pain can be caused by many different problems ranging from benign to life-threatening. Since your CT scan showed signs of coronary artery disease that places you at risk of future cardiac events, we will refer you to cardiologist (Dr. Laurance Flatten in [place]) for further evaluation. If you have any severe chest pain that does not resolve with rest, please call 911 or proceed to the nearest emergency department.   FOLLOW-UP APPOINTMENT: Follow-up visit in 6 months  ** Thank you for trusting me with your healthcare!  I strive to provide all of my patients with quality care at each visit.  If you receive a survey for this visit, I would be so grateful to you for taking the time to provide feedback.  Thank you in advance!  ~ Keir Viernes                   Dr. Doreatha Massed   &   Rojelio Brenner, PA-C   - - - - - - - - - - - - - - - - - -    Thank you for choosing [facility] at [facility] to provide your oncology and hematology care.  To afford each patient quality time with our provider, please arrive at least 15 minutes before your scheduled appointment time.   If you have a lab appointment with the Cancer Center please come in thru the Main Entrance and  check in at the main information desk.  You need to re-schedule your appointment should you arrive 10 or more minutes late.  We strive to give you quality time with our providers, and arriving late affects you and other patients whose appointments are after yours.  Also, if you no show three or more times for appointments you may be dismissed from the clinic at the providers discretion.     Again, thank you for choosing [facility].  Our hope is that these requests will decrease the amount of time that you wait before being seen by our physicians.       _____________________________________________________________  Should you have questions after your visit to [facility], please contact our office at [phone] and follow the prompts.  Our office hours are 8:00 a.m. and 4:30 p.m. Monday - Friday.  Please note that voicemails left after 4:00 p.m. may not be returned until the following business day.  We are closed weekends and major holidays.  You do have access to a nurse 24-7, just call the main number to the clinic [phone] and do not press any options, hold on the line and a nurse will answer the phone.    For prescription refill requests, have your pharmacy contact our office and allow 72 hours.

## 2022-10-18 ENCOUNTER — Ambulatory Visit: Payer: Medicare Other | Admitting: Hematology

## 2022-10-22 ENCOUNTER — Other Ambulatory Visit: Payer: Self-pay | Admitting: Hematology

## 2022-10-22 ENCOUNTER — Other Ambulatory Visit: Payer: Self-pay | Admitting: *Deleted

## 2022-10-22 DIAGNOSIS — C50412 Malignant neoplasm of upper-outer quadrant of left female breast: Secondary | ICD-10-CM

## 2022-10-22 MED ORDER — ANASTROZOLE 1 MG PO TABS
1.0000 mg | ORAL_TABLET | Freq: Every day | ORAL | 1 refills | Status: DC
Start: 2022-10-22 — End: 2022-11-18

## 2022-10-28 ENCOUNTER — Inpatient Hospital Stay: Payer: Medicare Other

## 2022-10-28 VITALS — BP 126/56 | HR 69 | Temp 97.0°F | Resp 18

## 2022-10-28 DIAGNOSIS — D500 Iron deficiency anemia secondary to blood loss (chronic): Secondary | ICD-10-CM

## 2022-10-28 DIAGNOSIS — C50412 Malignant neoplasm of upper-outer quadrant of left female breast: Secondary | ICD-10-CM | POA: Diagnosis not present

## 2022-10-28 MED ORDER — CETIRIZINE HCL 10 MG PO TABS
10.0000 mg | ORAL_TABLET | Freq: Once | ORAL | Status: AC
  Administered 2022-10-28: 10 mg via ORAL
  Filled 2022-10-28: qty 1

## 2022-10-28 MED ORDER — SODIUM CHLORIDE 0.9 % IV SOLN: Freq: Once | INTRAVENOUS | Status: AC

## 2022-10-28 MED ORDER — SODIUM CHLORIDE 0.9 % IV SOLN
510.0000 mg | Freq: Once | INTRAVENOUS | Status: AC
  Administered 2022-10-28: 510 mg via INTRAVENOUS
  Filled 2022-10-28: qty 510

## 2022-10-28 NOTE — Progress Notes (Signed)
Patient presents today for iron infusion.  Patient is in satisfactory condition with no new complaints voiced.  IV placed in R arm.  IV flushed well with good blood return noted.  Vital signs are stable.  We will proceed with infusion per provider orders.    Patient tolerated infusion well with no complaints voiced.  Patient left ambulatory in stable condition.  Vital signs stable at discharge.  Follow up as scheduled.

## 2022-10-28 NOTE — Patient Instructions (Signed)
[facility]  Discharge Instructions: Thank you for choosing [facility] to provide your oncology and hematology care.  If you have a lab appointment with the Cancer Center - please note that after April 8th, 2024, all labs will be drawn in the cancer center.  You do not have to check in or register with the main entrance as you have in the past but will complete your check-in in the cancer center.  Wear comfortable clothing and clothing appropriate for easy access to any Portacath or PICC line.   We strive to give you quality time with your provider. You may need to reschedule your appointment if you arrive late (15 or more minutes).  Arriving late affects you and other patients whose appointments are after yours.  Also, if you miss three or more appointments without notifying the office, you may be dismissed from the clinic at the provider's discretion.      For prescription refill requests, have your pharmacy contact our office and allow 72 hours for refills to be completed.    Ferumoxytol Injection What is this medication? FERUMOXYTOL (FER ue MOX i tol) treats low levels of iron in your body (iron deficiency anemia). Iron is a mineral that plays an important role in making red blood cells, which carry oxygen from your lungs to the rest of your body. This medicine may be used for other purposes; ask your health care provider or pharmacist if you have questions. COMMON BRAND NAME(S): Feraheme What should I tell my care team before I take this medication? They need to know if you have any of these conditions: Anemia not caused by low iron levels High levels of iron in the blood Magnetic resonance imaging (MRI) test scheduled An unusual or allergic reaction to iron, other medications, foods, dyes, or preservatives Pregnant or trying to get pregnant Breastfeeding How should I use this medication? This medication is injected into a vein. It is given by your  care team in a hospital or clinic setting. Talk to your care team the use of this medication in children. Special care may be needed. Overdosage: If you think you have taken too much of this medicine contact a poison control center or emergency room at once. NOTE: This medicine is only for you. Do not share this medicine with others. What if I miss a dose? It is important not to miss your dose. Call your care team if you are unable to keep an appointment. What may interact with this medication? Other iron products This list may not describe all possible interactions. Give your health care provider a list of all the medicines, herbs, non-prescription drugs, or dietary supplements you use. Also tell them if you smoke, drink alcohol, or use illegal drugs. Some items may interact with your medicine. What should I watch for while using this medication? Visit your care team regularly. Tell your care team if your symptoms do not start to get better or if they get worse. You may need blood work done while you are taking this medication. You may need to follow a special diet. Talk to your care team. Foods that contain iron include: whole grains/cereals, dried fruits, beans, or peas, leafy green vegetables, and organ meats (liver, kidney). What side effects may I notice from receiving this medication? Side effects that you should report to your care team as soon as possible: Allergic reactions--skin rash, itching, hives, swelling of the face, lips, tongue, or throat Low blood pressure--dizziness,  feeling faint or lightheaded, blurry vision Shortness of breath Side effects that usually do not require medical attention (report to your care team if they continue or are bothersome): Flushing Headache Joint pain Muscle pain Nausea Pain, redness, or irritation at injection site This list may not describe all possible side effects. Call your doctor for medical advice about side effects. You may report side  effects to FDA at 1-800-FDA-1088. Where should I keep my medication? This medication is given in a hospital or clinic and will not be stored at home. NOTE: This sheet is a summary. It may not cover all possible information. If you have questions about this medicine, talk to your doctor, pharmacist, or health care provider.  2024 Elsevier/Gold Standard (2021-11-30 00:00:00)    To help prevent nausea and vomiting after your treatment, we encourage you to take your nausea medication as directed.  BELOW ARE SYMPTOMS THAT SHOULD BE REPORTED IMMEDIATELY: *FEVER GREATER THAN 100.4 F (38 C) OR HIGHER *CHILLS OR SWEATING *NAUSEA AND VOMITING THAT IS NOT CONTROLLED WITH YOUR NAUSEA MEDICATION *UNUSUAL SHORTNESS OF BREATH *UNUSUAL BRUISING OR BLEEDING *URINARY PROBLEMS (pain or burning when urinating, or frequent urination) *BOWEL PROBLEMS (unusual diarrhea, constipation, pain near the anus) TENDERNESS IN MOUTH AND THROAT WITH OR WITHOUT PRESENCE OF ULCERS (sore throat, sores in mouth, or a toothache) UNUSUAL RASH, SWELLING OR PAIN  UNUSUAL VAGINAL DISCHARGE OR ITCHING   Items with * indicate a potential emergency and should be followed up as soon as possible or go to the Emergency Department if any problems should occur.  Please show the CHEMOTHERAPY ALERT CARD or IMMUNOTHERAPY ALERT CARD at check-in to the Emergency Department and triage nurse.  Should you have questions after your visit or need to cancel or reschedule your appointment, please contact [facility] [phone]  and follow the prompts.  Office hours are 8:00 a.m. to 4:30 p.m. Monday - Friday. Please note that voicemails left after 4:00 p.m. may not be returned until the following business day.  We are closed weekends and major holidays. You have access to a nurse at all times for urgent questions. Please call the main number to the clinic [phone] and follow the prompts.  For any non-urgent questions, you  may also contact your provider using MyChart. We now offer e-Visits for anyone 75 and older to request care online for non-urgent symptoms. For details visit [email or web address].   Also download the MyChart app! Go to the app store, search "MyChart", open the app, select [place], and log in with your MyChart username and password.

## 2022-10-29 ENCOUNTER — Encounter (HOSPITAL_COMMUNITY): Payer: Self-pay

## 2022-11-05 ENCOUNTER — Inpatient Hospital Stay: Payer: Medicare Other

## 2022-11-05 VITALS — BP 129/67 | HR 78 | Temp 97.9°F | Resp 16

## 2022-11-05 DIAGNOSIS — D500 Iron deficiency anemia secondary to blood loss (chronic): Secondary | ICD-10-CM

## 2022-11-05 DIAGNOSIS — C50412 Malignant neoplasm of upper-outer quadrant of left female breast: Secondary | ICD-10-CM | POA: Diagnosis not present

## 2022-11-05 MED ORDER — SODIUM CHLORIDE 0.9 % IV SOLN
510.0000 mg | Freq: Once | INTRAVENOUS | Status: AC
  Administered 2022-11-05: 510 mg via INTRAVENOUS
  Filled 2022-11-05: qty 510

## 2022-11-05 MED ORDER — SODIUM CHLORIDE 0.9 % IV SOLN: Freq: Once | INTRAVENOUS | Status: AC

## 2022-11-05 NOTE — Patient Instructions (Signed)
[facility] [place]  Discharge Instructions: Thank you for choosing [facility] to provide your oncology and hematology care.  If you have a lab appointment with the Cancer Center - please note that after April 8th, 2024, all labs will be drawn in the cancer center.  You do not have to check in or register with the main entrance as you have in the past but will complete your check-in in the cancer center.  Wear comfortable clothing and clothing appropriate for easy access to any Portacath or PICC line.   We strive to give you quality time with your provider. You may need to reschedule your appointment if you arrive late (15 or more minutes).  Arriving late affects you and other patients whose appointments are after yours.  Also, if you miss three or more appointments without notifying the office, you may be dismissed from the clinic at the provider's discretion.      For prescription refill requests, have your pharmacy contact our office and allow 72 hours for refills to be completed.    Ferumoxytol Injection What is this medication? FERUMOXYTOL (FER ue MOX i tol) treats low levels of iron in your body (iron deficiency anemia). Iron is a mineral that plays an important role in making red blood cells, which carry oxygen from your lungs to the rest of your body. This medicine may be used for other purposes; ask your health care provider or pharmacist if you have questions. COMMON BRAND NAME(S): Feraheme What should I tell my care team before I take this medication? They need to know if you have any of these conditions: Anemia not caused by low iron levels High levels of iron in the blood Magnetic resonance imaging (MRI) test scheduled An unusual or allergic reaction to iron, other medications, foods, dyes, or preservatives Pregnant or trying to get pregnant Breastfeeding How should I use this medication? This medication is injected into a vein. It is given by your  care team in a hospital or clinic setting. Talk to your care team the use of this medication in children. Special care may be needed. Overdosage: If you think you have taken too much of this medicine contact a poison control center or emergency room at once. NOTE: This medicine is only for you. Do not share this medicine with others. What if I miss a dose? It is important not to miss your dose. Call your care team if you are unable to keep an appointment. What may interact with this medication? Other iron products This list may not describe all possible interactions. Give your health care provider a list of all the medicines, herbs, non-prescription drugs, or dietary supplements you use. Also tell them if you smoke, drink alcohol, or use illegal drugs. Some items may interact with your medicine. What should I watch for while using this medication? Visit your care team regularly. Tell your care team if your symptoms do not start to get better or if they get worse. You may need blood work done while you are taking this medication. You may need to follow a special diet. Talk to your care team. Foods that contain iron include: whole grains/cereals, dried fruits, beans, or peas, leafy green vegetables, and organ meats (liver, kidney). What side effects may I notice from receiving this medication? Side effects that you should report to your care team as soon as possible: Allergic reactions--skin rash, itching, hives, swelling of the face, lips, tongue, or throat Low blood pressure--dizziness,   feeling faint or lightheaded, blurry vision Shortness of breath Side effects that usually do not require medical attention (report to your care team if they continue or are bothersome): Flushing Headache Joint pain Muscle pain Nausea Pain, redness, or irritation at injection site This list may not describe all possible side effects. Call your doctor for medical advice about side effects. You may report side  effects to FDA at 1-800-FDA-1088. Where should I keep my medication? This medication is given in a hospital or clinic and will not be stored at home. NOTE: This sheet is a summary. It may not cover all possible information. If you have questions about this medicine, talk to your doctor, pharmacist, or health care provider.  2024 Elsevier/Gold Standard (2021-11-30 00:00:00)    To help prevent nausea and vomiting after your treatment, we encourage you to take your nausea medication as directed.  BELOW ARE SYMPTOMS THAT SHOULD BE REPORTED IMMEDIATELY: *FEVER GREATER THAN 100.4 F (38 C) OR HIGHER *CHILLS OR SWEATING *NAUSEA AND VOMITING THAT IS NOT CONTROLLED WITH YOUR NAUSEA MEDICATION *UNUSUAL SHORTNESS OF BREATH *UNUSUAL BRUISING OR BLEEDING *URINARY PROBLEMS (pain or burning when urinating, or frequent urination) *BOWEL PROBLEMS (unusual diarrhea, constipation, pain near the anus) TENDERNESS IN MOUTH AND THROAT WITH OR WITHOUT PRESENCE OF ULCERS (sore throat, sores in mouth, or a toothache) UNUSUAL RASH, SWELLING OR PAIN  UNUSUAL VAGINAL DISCHARGE OR ITCHING   Items with * indicate a potential emergency and should be followed up as soon as possible or go to the Emergency Department if any problems should occur.  Please show the CHEMOTHERAPY ALERT CARD or IMMUNOTHERAPY ALERT CARD at check-in to the Emergency Department and triage nurse.  Should you have questions after your visit or need to cancel or reschedule your appointment, please contact [facility] [place] [phone]  and follow the prompts.  Office hours are 8:00 a.m. to 4:30 p.m. Monday - Friday. Please note that voicemails left after 4:00 p.m. may not be returned until the following business day.  We are closed weekends and major holidays. You have access to a nurse at all times for urgent questions. Please call the main number to the clinic [phone] and follow the prompts.  For any non-urgent questions, you  may also contact your provider using MyChart. We now offer e-Visits for anyone 18 and older to request care online for non-urgent symptoms. For details visit [email or web address].   Also download the MyChart app! Go to the app store, search "MyChart", open the app, select [place], and log in with your MyChart username and password.   

## 2022-11-05 NOTE — Progress Notes (Signed)
Patient presents today for iron infusion.  Patient is in satisfactory condition with no new complaints voiced.  Vital signs are stable.  IV placed in right AC.  IV flushed well with good blood return noted.  Benadryl 25 mg taken at 1055 at home prior to visit.  We will hold Zyrtec today.  We will proceed with infusion per provider orders.    Patient tolerated infusion well with no complaints voiced.  Patient left ambulatory in stable condition.  Vital signs stable at discharge.  Follow up as scheduled.

## 2022-11-18 ENCOUNTER — Other Ambulatory Visit: Payer: Self-pay | Admitting: Hematology

## 2022-11-18 ENCOUNTER — Other Ambulatory Visit: Payer: Self-pay | Admitting: *Deleted

## 2022-11-18 DIAGNOSIS — Z170 Estrogen receptor positive status [ER+]: Secondary | ICD-10-CM

## 2022-11-18 NOTE — Telephone Encounter (Signed)
Anastrozole refill approved.  Patient is tolerating and is to continue therapy.  

## 2022-12-22 NOTE — Progress Notes (Unsigned)
Cardiology Office Note:   Date:  12/28/2022  ID:  Sofya C Kraker, DOB 02-Jun-1956, MRN [id number]  History of Present Illness:   Erica Giles is a 67 y.o. female with history of breast cancer s/p lumpectomy and XRT. DMII. HTN, and HLD who was referred by Rojelio Brenner for further evaluation of chest pain.  Patient seen by Rojelio Brenner on 10/14/2022 where she reported episodes of a dull aching sensation in the center of her chest usually when she was up and moving around. She is now referred to Cardiology for further evaluation.  Today, the patient states that she is overall doing okay. Has been having episodes of chest "achiness" usually that occurs with movement. She is under a lot of stress at home and think this has been contributing. Also notes dyspnea on exertion which she attributes to weight gain following retirement. No orthopnea, PND or significant LE edema. Has known OSA and is on CPAP.  Past Medical History:  Diagnosis Date   Anxiety    Arthritis    Breast cancer (HCC)    left   Cataract    BEGINNING STAGES   CTS (carpal tunnel syndrome)    Depression    Diabetes mellitus    ORAL MED   Family history of breast cancer    GERD (gastroesophageal reflux disease)    Helicobacter pylori gastritis    Hiatal hernia    HNP (herniated nucleus pulposus), lumbar    PT TRYING TO AVOID LUMBAR SURGERY--STATES PAIN IN HER BACK AND SOMETIMES DOWN ONE OR THE OTHER LEG   Hyperlipidemia    Hypertension    Osteopenia    Personal history of radiation therapy    Plantar fasciitis    BILATERAL   Schatzki's ring    Shortness of breath    WITH EXERTION-PT RELATES TO HER WEIGHT   Sleep apnea    MODERATE- PER SLEEP STUDY 06/2010--PT USES CPAP-SETTING IS 11     ROS: As per HPI  Studies Reviewed:    EKG:  NSR with sinus arrhythmia, HR 88-personally reviewed       Risk Assessment/Calculations:              Physical Exam:   VS:  BP 134/66   Pulse 91   Ht 5\' 5"  (1.651 m)    Wt 237 lb 3.2 oz (107.6 kg)   LMP  (LMP Unknown)   SpO2 95%   BMI 39.47 kg/m    Wt Readings from Last 3 Encounters:  12/28/22 237 lb 3.2 oz (107.6 kg)  10/14/22 236 lb (107 kg)  05/27/22 221 lb 3.2 oz (100.3 kg)     GEN: Well nourished, well developed in no acute distress NECK: No JVD; No carotid bruits CARDIAC: RRR, 2/6 systolic murmur at RUSB. No rubs, gallops RESPIRATORY:  Clear to auscultation without rales, wheezing or rhonchi  ABDOMEN: Soft, non-tender, non-distended EXTREMITIES:  Trace LE edema, warm  ASSESSMENT AND PLAN:   #Chest Pain: -Patient reports episodes of dull, achy pain in the center of her chest that is usually worsened by exertion/stress -Prior CT with coronary artery calcifications -Will check coronary CTA for further evaluation -Check TTE  #HLD: -Continue lipitor 80mg  daily -LDL 66 in 01/2022; followed by PCP  #DMII: -Poorly controlled with A1C 11.6 -Discussed diet at length as suspect this is the primary driver of her uncontrolled BG -Continue insulin -Refer to pharm D for consideration for [facility] pending insurance coverage  #History of breast cancer: -S/p lumpectomy and XRT -  Currently on anaztrozole  #HTN: -Continue lisinopril 20mg  daily        Signed, Meriam Sprague, MD

## 2022-12-28 ENCOUNTER — Encounter: Payer: Self-pay | Admitting: Cardiology

## 2022-12-28 ENCOUNTER — Ambulatory Visit: Payer: Medicare Other | Attending: Cardiology | Admitting: Cardiology

## 2022-12-28 VITALS — BP 134/66 | HR 91 | Ht 65.0 in | Wt 237.2 lb

## 2022-12-28 DIAGNOSIS — E782 Mixed hyperlipidemia: Secondary | ICD-10-CM | POA: Diagnosis not present

## 2022-12-28 DIAGNOSIS — I25119 Atherosclerotic heart disease of native coronary artery with unspecified angina pectoris: Secondary | ICD-10-CM | POA: Insufficient documentation

## 2022-12-28 DIAGNOSIS — R072 Precordial pain: Secondary | ICD-10-CM | POA: Diagnosis present

## 2022-12-28 DIAGNOSIS — E1165 Type 2 diabetes mellitus with hyperglycemia: Secondary | ICD-10-CM | POA: Diagnosis not present

## 2022-12-28 DIAGNOSIS — Z794 Long term (current) use of insulin: Secondary | ICD-10-CM

## 2022-12-28 MED ORDER — METOPROLOL TARTRATE 100 MG PO TABS: 100.0000 mg | ORAL_TABLET | Freq: Once | ORAL | 0 refills | Status: DC

## 2022-12-28 MED ORDER — MOUNJARO 2.5 MG/0.5ML ~~LOC~~ SOAJ: 2.5000 mg | SUBCUTANEOUS | 0 refills | Status: DC

## 2022-12-28 NOTE — Patient Instructions (Addendum)
Medication Instructions:  Your physician recommends that you continue on your current medications as directed. Please refer to the Current Medication list given to you today.  *If you need a refill on your cardiac medications before your next appointment, please call your pharmacy*   Lab Work: BMET If you have labs (blood work) drawn today and your tests are completely normal, you will receive your results only by: MyChart Message (if you have MyChart) OR A paper copy in the mail If you have any lab test that is abnormal or we need to change your treatment, we will call you to review the results.   Testing/Procedures: ECHO Your physician has requested that you have an echocardiogram. Echocardiography is a painless test that uses sound waves to create images of your heart. It provides your doctor with information about the size and shape of your heart and how well your heart's chambers and valves are working. This procedure takes approximately one hour. There are no restrictions for this procedure. Please do NOT wear cologne, perfume, aftershave, or lotions (deodorant is allowed). Please arrive 15 minutes prior to your appointment time.   Cardiac CT Your physician has requested that you have cardiac CT. Cardiac computed tomography (CT) is a painless test that uses an x-ray machine to take clear, detailed pictures of your heart. For further information please visit [email or web address] Please follow instruction sheet as given.    Follow-Up: At [facility], you and your health needs are our priority.  As part of our continuing mission to provide you with exceptional heart care, we have created designated Provider Care Teams.  These Care Teams include your primary Cardiologist (physician) and Advanced Practice Providers (APPs -  Physician Assistants and Nurse Practitioners) who all work together to provide you with the care you need, when you need it.   Your next appointment:   6  month(s)  Provider:   Dina Rich, MD    Other Instructions    [facility] [address] [place], [place] [address] [phone]  Please arrive at the [facility] Entrance (Entrance C2) of [facility] 30 minutes prior to test start time. You can use the FREE valet parking offered at entrance C (encouraged to control the heart rate for the test)  Proceed to the [facility] Radiology Department (first floor) to check-in and test prep.  All radiology patients and guests should use entrance C2 at [facility], accessed from [facility], even though the hospital's physical address listed is [address].      Please follow these instructions carefully (unless otherwise directed):  An IV will be required for this test and Nitroglycerin will be given.    On the Night Before the Test: Be sure to Drink plenty of water. Do not consume any caffeinated/decaffeinated beverages or chocolate 12 hours prior to your test. Do not take any antihistamines 12 hours prior to your test.  On the Day of the Test: Drink plenty of water until 1 hour prior to the test. Do not eat any food 1 hour prior to test. You may take your regular medications prior to the test.  Take metoprolol (Lopressor) two hours prior to test. FEMALES- please wear underwire-free bra if available, avoid dresses & tight clothing       After the Test: Drink plenty of water. After receiving IV contrast, you may experience a mild flushed feeling. This is normal. On occasion, you may experience a mild rash  up to 24 hours after the test. This is not dangerous. If this occurs, you can take Benadryl 25 mg and increase your fluid intake. If you experience trouble breathing, this can be serious. If it is severe call 911 IMMEDIATELY. If it is mild, please call our office. If you take any of these medications: Glipizide/Metformin, Avandament, Glucavance, please do not  take 48 hours after completing test unless otherwise instructed.  We will call to schedule your test 2-4 weeks out understanding that some insurance companies will need an authorization prior to the service being performed.   For more information and frequently asked questions, please visit our website : [email or web address]  For non-scheduling related questions, please contact the cardiac imaging nurse navigator should you have any questions/concerns: Cardiac Imaging Nurse Navigators Direct Office Dial: [phone]   For scheduling needs, including cancellations and rescheduling, please call [place], [phone].

## 2022-12-29 ENCOUNTER — Other Ambulatory Visit: Payer: Self-pay

## 2022-12-29 LAB — BASIC METABOLIC PANEL
BUN/Creatinine Ratio: 13 (ref 12–28)
BUN: 13 mg/dL (ref 8–27)
CO2: 25 mmol/L (ref 20–29)
Calcium: 9.4 mg/dL (ref 8.7–10.3)
Chloride: 101 mmol/L (ref 96–106)
Creatinine, Ser: 1 mg/dL (ref 0.57–1.00)
Glucose: 318 mg/dL — ABNORMAL HIGH (ref 70–99)
Potassium: 4.7 mmol/L (ref 3.5–5.2)
Sodium: 137 mmol/L (ref 134–144)
eGFR: 63 mL/min/{1.73_m2} (ref >59)

## 2022-12-29 MED ORDER — METOPROLOL TARTRATE 100 MG PO TABS: ORAL_TABLET | ORAL | 0 refills | Status: DC

## 2023-01-05 ENCOUNTER — Ambulatory Visit: Payer: Medicare Other

## 2023-01-05 DIAGNOSIS — R072 Precordial pain: Secondary | ICD-10-CM

## 2023-01-05 DIAGNOSIS — E782 Mixed hyperlipidemia: Secondary | ICD-10-CM | POA: Diagnosis not present

## 2023-01-05 DIAGNOSIS — E1165 Type 2 diabetes mellitus with hyperglycemia: Secondary | ICD-10-CM

## 2023-01-05 DIAGNOSIS — I25119 Atherosclerotic heart disease of native coronary artery with unspecified angina pectoris: Secondary | ICD-10-CM | POA: Diagnosis not present

## 2023-02-15 ENCOUNTER — Ambulatory Visit (HOSPITAL_COMMUNITY)
Admission: RE | Admit: 2023-02-15 | Discharge: 2023-02-15 | Disposition: A | Discharge status: Home / Self Care | Payer: Medicare Other | Source: Ambulatory Visit | Attending: Cardiology | Admitting: Cardiology

## 2023-02-15 DIAGNOSIS — R072 Precordial pain: Secondary | ICD-10-CM | POA: Insufficient documentation

## 2023-02-15 MED ORDER — IOHEXOL 350 MG/ML SOLN
75.0000 mL | Freq: Once | INTRAVENOUS | Status: AC | PRN
  Administered 2023-02-15: 75 mL via INTRAVENOUS

## 2023-02-16 LAB — POCT I-STAT CREATININE: Creatinine, Ser: 1.2 mg/dL — ABNORMAL HIGH (ref 0.44–1.00)

## 2023-02-17 ENCOUNTER — Inpatient Hospital Stay: Payer: MEDICARE | Primary: Family Medicine

## 2023-02-17 ENCOUNTER — Inpatient Hospital Stay: Admit: 2023-02-17 | Payer: MEDICARE | Primary: Family Medicine

## 2023-02-17 ENCOUNTER — Encounter

## 2023-02-17 DIAGNOSIS — Z01810 Encounter for preprocedural cardiovascular examination: Secondary | ICD-10-CM

## 2023-02-17 DIAGNOSIS — I10 Essential (primary) hypertension: Secondary | ICD-10-CM

## 2023-02-17 LAB — CBC WITH AUTO DIFFERENTIAL
Basophils Absolute: 0.1 10*3/uL (ref 0.0–0.1)
Basophils: 0.7 %
Eosinophils Absolute: 0.5 10*3/uL — ABNORMAL HIGH (ref 0.0–0.4)
Eosinophils: 5 %
Hematocrit: 45.2 % (ref 37.0–47.0)
Hemoglobin: 14.3 g/dL (ref 12.0–16.0)
Immature Grans (Abs): 0.03 10*3/uL (ref 0.00–0.07)
Immature Granulocytes %: 0.3 %
Lymphocytes Absolute: 3.4 10*3/uL (ref 1.0–4.8)
Lymphocytes: 35.5 %
MCH: 30 pg (ref 26.0–33.0)
MCHC: 31.6 g/dL — ABNORMAL LOW (ref 32.2–35.5)
MCV: 94.8 fL (ref 81.0–99.0)
MPV: 9.5 fL (ref 9.4–12.4)
Monocytes %: 6.7 %
Monocytes Absolute: 0.6 10*3/uL (ref 0.4–1.3)
Neutrophils Absolute: 5 10*3/uL (ref 1.8–7.7)
Platelets: 370 10*3/uL (ref 130–400)
RBC: 4.77 10*6/uL (ref 4.20–5.40)
RDW-CV: 15.2 % — ABNORMAL HIGH (ref 11.5–14.5)
RDW-SD: 53 fL — ABNORMAL HIGH (ref 35.0–45.0)
Seg Neutrophils: 51.8 %
WBC: 9.7 10*3/uL (ref 4.8–10.8)
nRBC: 0 /100{WBCs}

## 2023-02-17 LAB — URINE WITH REFLEXED MICRO
Bacteria, UA: NONE SEEN /HPF
Bilirubin, Urine: NEGATIVE
CASTS 2: NONE SEEN /LPF
Crystals, UA: NONE SEEN
Glucose, Ur: NEGATIVE mg/dL
Leukocyte Esterase, Urine: NEGATIVE
MISCELLANEOUS 2: NONE SEEN
Nitrite, Urine: NEGATIVE
Renal Epithelial, UA: NONE SEEN
Specific Gravity, UA: >1.03 — AB (ref 1.002–1.030)
Urobilinogen, Urine: 1 eu/dl (ref 0.0–1.0)
Yeast, UA: NONE SEEN
pH, Urine: 5.5 (ref 5.0–9.0)

## 2023-02-17 LAB — BASIC METABOLIC PANEL
BUN: 17 mg/dL (ref 7–22)
CO2: 29 meq/L (ref 23–33)
Calcium: 9.4 mg/dL (ref 8.5–10.5)
Chloride: 101 meq/L (ref 98–111)
Creatinine: 0.8 mg/dL (ref 0.4–1.2)
Glucose: 95 mg/dL (ref 70–108)
Potassium: 4.6 meq/L (ref 3.5–5.2)
Sodium: 139 meq/L (ref 135–145)

## 2023-02-17 LAB — GLOMERULAR FILTRATION RATE, ESTIMATED: Est, Glom Filt Rate: 81 mL/min/{1.73_m2} (ref >60)

## 2023-02-17 LAB — EKG 12-LEAD
Atrial Rate: 70 {beats}/min
P Axis: 62 degrees
P-R Interval: 154 ms
Q-T Interval: 412 ms
QRS Duration: 88 ms
QTc Calculation (Bazett): 444 ms
R Axis: 1 degrees
T Axis: 66 degrees
Ventricular Rate: 70 {beats}/min

## 2023-02-17 LAB — ANION GAP: Anion Gap: 9 meq/L (ref 8.0–16.0)

## 2023-02-18 LAB — MRSA BY PCR: MRSA SCREEN RT-PCR: NEGATIVE

## 2023-03-03 ENCOUNTER — Telehealth: Payer: Self-pay

## 2023-03-03 DIAGNOSIS — I25119 Atherosclerotic heart disease of native coronary artery with unspecified angina pectoris: Secondary | ICD-10-CM

## 2023-03-03 MED ORDER — METOPROLOL TARTRATE 100 MG PO TABS: ORAL_TABLET | ORAL | 0 refills | Status: DC

## 2023-03-03 NOTE — Telephone Encounter (Signed)
Pt notified and verbalized understanding of obtaining CTA.

## 2023-03-03 NOTE — Telephone Encounter (Signed)
-----   Message from Vishnu P Mallipeddi sent at 03/02/2023 10:14 AM EDT ----- Chart viewed, Dr. Shari Prows requested for CTA cardiac and not CTPE.  Stable lung nodule, follow-up with PCP/pulmonary. Obtain CTA cardiac for coronary artery evaluation.

## 2023-03-06 ENCOUNTER — Other Ambulatory Visit: Payer: Self-pay | Admitting: Internal Medicine

## 2023-03-07 ENCOUNTER — Ambulatory Visit (HOSPITAL_COMMUNITY)
Admission: RE | Admit: 2023-03-07 | Discharge: 2023-03-07 | Disposition: A | Discharge status: Home / Self Care | Payer: Medicare Other | Source: Ambulatory Visit | Attending: Physician Assistant | Admitting: Physician Assistant

## 2023-03-07 DIAGNOSIS — Z170 Estrogen receptor positive status [ER+]: Secondary | ICD-10-CM | POA: Insufficient documentation

## 2023-03-07 DIAGNOSIS — Z1231 Encounter for screening mammogram for malignant neoplasm of breast: Secondary | ICD-10-CM | POA: Insufficient documentation

## 2023-03-07 DIAGNOSIS — C50412 Malignant neoplasm of upper-outer quadrant of left female breast: Secondary | ICD-10-CM | POA: Diagnosis present

## 2023-03-08 ENCOUNTER — Other Ambulatory Visit (HOSPITAL_COMMUNITY): Payer: Self-pay | Admitting: Orthopaedic Surgery

## 2023-03-08 DIAGNOSIS — M79671 Pain in right foot: Secondary | ICD-10-CM

## 2023-03-12 ENCOUNTER — Ambulatory Visit (HOSPITAL_COMMUNITY)
Admission: RE | Admit: 2023-03-12 | Discharge: 2023-03-12 | Disposition: A | Discharge status: Home / Self Care | Payer: Medicare Other | Source: Ambulatory Visit | Attending: Orthopaedic Surgery

## 2023-03-12 DIAGNOSIS — M79671 Pain in right foot: Secondary | ICD-10-CM | POA: Diagnosis present

## 2023-03-14 ENCOUNTER — Encounter: Payer: Self-pay | Admitting: Hematology

## 2023-03-16 ENCOUNTER — Ambulatory Visit (HOSPITAL_COMMUNITY): Payer: Medicare Other

## 2023-03-17 ENCOUNTER — Encounter (HOSPITAL_COMMUNITY): Payer: Self-pay

## 2023-03-21 ENCOUNTER — Telehealth (HOSPITAL_COMMUNITY): Payer: Self-pay | Admitting: *Deleted

## 2023-03-21 NOTE — Telephone Encounter (Signed)
Attempted to call patient regarding upcoming cardiac CT appointment. Left message on voicemail with name and callback number [name] RN Navigator Cardiac Imaging [facility] V[facility] Services [phone] Office

## 2023-03-21 NOTE — Telephone Encounter (Signed)
Reaching out to patient to offer assistance regarding upcoming cardiac imaging study; pt verbalizes understanding of appt date/time, parking situation and where to check in, pre-test NPO status and medications ordered, and verified current allergies; name and call back number provided for further questions should they arise  Larey Brick RN Navigator Cardiac Imaging [facility] [phone] office [phone] cell  Patient to take 100mg  metoprolol tartrate two hours prior to her cardiac CT scan. She is aware to arrive at 8:30 AM.

## 2023-03-22 ENCOUNTER — Ambulatory Visit (HOSPITAL_COMMUNITY)
Admission: RE | Admit: 2023-03-22 | Discharge: 2023-03-22 | Disposition: A | Discharge status: Home / Self Care | Payer: Medicare Other | Source: Ambulatory Visit | Attending: Internal Medicine | Admitting: Internal Medicine

## 2023-03-22 DIAGNOSIS — I25119 Atherosclerotic heart disease of native coronary artery with unspecified angina pectoris: Secondary | ICD-10-CM | POA: Diagnosis present

## 2023-03-22 MED ORDER — NITROGLYCERIN 0.4 MG SL SUBL
0.8000 mg | SUBLINGUAL_TABLET | Freq: Once | SUBLINGUAL | Status: AC
  Administered 2023-03-22: 0.8 mg via SUBLINGUAL

## 2023-03-22 MED ORDER — NITROGLYCERIN 0.4 MG SL SUBL
SUBLINGUAL_TABLET | SUBLINGUAL | Status: AC
  Filled 2023-03-22: qty 2

## 2023-03-22 MED ORDER — IOHEXOL 350 MG/ML SOLN
100.0000 mL | Freq: Once | INTRAVENOUS | Status: AC | PRN
  Administered 2023-03-22: 100 mL via INTRAVENOUS

## 2023-04-20 LAB — LIPID PANEL
CHOLESTEROL/HDL RELATIVE RISK: 3.8 — ABNORMAL LOW (ref 4.0–4.4)
Cholesterol, Total: 164 mg/dL (ref <200)
Direct-LDL / HDL Risk: 2.1 (ref <3.1)
HDL: 43 mg/dL
LDL Direct: 94 mg/dL
Triglycerides: 203 mg/dL — ABNORMAL HIGH (ref <150)
VLDL: 40 mg/dL — ABNORMAL HIGH (ref <39)

## 2023-04-25 ENCOUNTER — Inpatient Hospital Stay: Payer: Medicare Other | Attending: Hematology

## 2023-04-25 ENCOUNTER — Ambulatory Visit (HOSPITAL_COMMUNITY)
Admission: RE | Admit: 2023-04-25 | Discharge: 2023-04-25 | Disposition: A | Discharge status: Home / Self Care | Payer: Medicare Other | Source: Ambulatory Visit | Attending: Physician Assistant | Admitting: Physician Assistant

## 2023-04-25 ENCOUNTER — Ambulatory Visit (HOSPITAL_COMMUNITY)
Admission: RE | Admit: 2023-04-25 | Discharge: 2023-04-25 | Disposition: A | Discharge status: Home / Self Care | Payer: Medicare Other | Source: Ambulatory Visit | Attending: Physician Assistant

## 2023-04-25 DIAGNOSIS — D509 Iron deficiency anemia, unspecified: Secondary | ICD-10-CM | POA: Diagnosis present

## 2023-04-25 DIAGNOSIS — M8589 Other specified disorders of bone density and structure, multiple sites: Secondary | ICD-10-CM

## 2023-04-25 DIAGNOSIS — D500 Iron deficiency anemia secondary to blood loss (chronic): Secondary | ICD-10-CM

## 2023-04-25 DIAGNOSIS — R918 Other nonspecific abnormal finding of lung field: Secondary | ICD-10-CM | POA: Diagnosis present

## 2023-04-25 DIAGNOSIS — Z8639 Personal history of other endocrine, nutritional and metabolic disease: Secondary | ICD-10-CM | POA: Diagnosis not present

## 2023-04-25 DIAGNOSIS — M8580 Other specified disorders of bone density and structure, unspecified site: Secondary | ICD-10-CM | POA: Insufficient documentation

## 2023-04-25 DIAGNOSIS — Z170 Estrogen receptor positive status [ER+]: Secondary | ICD-10-CM | POA: Insufficient documentation

## 2023-04-25 DIAGNOSIS — C50412 Malignant neoplasm of upper-outer quadrant of left female breast: Secondary | ICD-10-CM | POA: Diagnosis not present

## 2023-04-25 LAB — IRON AND TIBC
Iron: 66 ug/dL (ref 28–170)
Saturation Ratios: 23 % (ref 10.4–31.8)
TIBC: 291 ug/dL (ref 250–450)
UIBC: 225 ug/dL

## 2023-04-25 LAB — CBC WITH DIFFERENTIAL/PLATELET
Abs Immature Granulocytes: 0.03 10*3/uL (ref 0.00–0.07)
Basophils Absolute: 0 10*3/uL (ref 0.0–0.1)
Basophils Relative: 0 %
Eosinophils Absolute: 0 10*3/uL (ref 0.0–0.5)
Eosinophils Relative: 1 %
HCT: 34.5 % — ABNORMAL LOW (ref 36.0–46.0)
Hemoglobin: 11.5 g/dL — ABNORMAL LOW (ref 12.0–15.0)
Immature Granulocytes: 1 %
Lymphocytes Relative: 23 %
Lymphs Abs: 1.2 10*3/uL (ref 0.7–4.0)
MCH: 31.4 pg (ref 26.0–34.0)
MCHC: 33.3 g/dL (ref 30.0–36.0)
MCV: 94.3 fL (ref 80.0–100.0)
Monocytes Absolute: 0.4 10*3/uL (ref 0.1–1.0)
Monocytes Relative: 7 %
Neutro Abs: 3.6 10*3/uL (ref 1.7–7.7)
Neutrophils Relative %: 68 %
Platelets: 175 10*3/uL (ref 150–400)
RBC: 3.66 MIL/uL — ABNORMAL LOW (ref 3.87–5.11)
RDW: 13.1 % (ref 11.5–15.5)
WBC: 5.2 10*3/uL (ref 4.0–10.5)
nRBC: 0 % (ref 0.0–0.2)

## 2023-04-25 LAB — COMPREHENSIVE METABOLIC PANEL
ALT: 25 U/L (ref 0–44)
AST: 20 U/L (ref 15–41)
Albumin: 3.4 g/dL — ABNORMAL LOW (ref 3.5–5.0)
Alkaline Phosphatase: 95 U/L (ref 38–126)
Anion gap: 9 (ref 5–15)
BUN: 12 mg/dL (ref 8–23)
CO2: 26 mmol/L (ref 22–32)
Calcium: 9.3 mg/dL (ref 8.9–10.3)
Chloride: 102 mmol/L (ref 98–111)
Creatinine, Ser: 0.87 mg/dL (ref 0.44–1.00)
GFR, Estimated: >60 mL/min (ref >60)
Glucose, Bld: 381 mg/dL — ABNORMAL HIGH (ref 70–99)
Potassium: 3.8 mmol/L (ref 3.5–5.1)
Sodium: 137 mmol/L (ref 135–145)
Total Bilirubin: 0.3 mg/dL (ref <1.2)
Total Protein: 6.3 g/dL — ABNORMAL LOW (ref 6.5–8.1)

## 2023-04-25 LAB — VITAMIN D 25 HYDROXY (VIT D DEFICIENCY, FRACTURES): Vit D, 25-Hydroxy: 28.86 ng/mL — ABNORMAL LOW (ref 30–100)

## 2023-04-25 LAB — FERRITIN: Ferritin: 98 ng/mL (ref 11–307)

## 2023-04-28 ENCOUNTER — Ambulatory Visit: Payer: Medicare Other | Admitting: Obstetrics and Gynecology

## 2023-04-28 ENCOUNTER — Encounter: Payer: Self-pay | Admitting: Obstetrics and Gynecology

## 2023-04-28 VITALS — BP 137/82 | HR 80 | Ht 63.5 in | Wt 230.8 lb

## 2023-04-28 DIAGNOSIS — M6289 Other specified disorders of muscle: Secondary | ICD-10-CM | POA: Diagnosis not present

## 2023-04-28 DIAGNOSIS — R350 Frequency of micturition: Secondary | ICD-10-CM

## 2023-04-28 DIAGNOSIS — N393 Stress incontinence (female) (male): Secondary | ICD-10-CM | POA: Diagnosis not present

## 2023-04-28 DIAGNOSIS — N3281 Overactive bladder: Secondary | ICD-10-CM

## 2023-04-28 DIAGNOSIS — N952 Postmenopausal atrophic vaginitis: Secondary | ICD-10-CM

## 2023-04-28 LAB — POCT URINALYSIS DIPSTICK
Bilirubin, UA: NEGATIVE
Blood, UA: NEGATIVE
Glucose, UA: POSITIVE — AB
Ketones, UA: NEGATIVE
Leukocytes, UA: NEGATIVE
Nitrite, UA: NEGATIVE
Protein, UA: NEGATIVE
Spec Grav, UA: 1.02 (ref 1.010–1.025)
Urobilinogen, UA: 0.2 U/dL
pH, UA: 6 (ref 5.0–8.0)

## 2023-04-28 MED ORDER — VIBEGRON 75 MG PO TABS
75.0000 mg | ORAL_TABLET | Freq: Every day | ORAL | 5 refills | Status: AC
Start: 2023-04-28 — End: ?

## 2023-04-28 NOTE — Progress Notes (Signed)
Erica Giles New Patient Evaluation and Consultation  Referring Provider: Donetta Potts, MD PCP: [facility] Date of Service: 04/28/2023  SUBJECTIVE Chief Complaint: New Patient (Initial Visit) (Erica Giles is a 67 y.o. female is here for urinary urgency and incontinence. )  History of Present Illness: Erica Giles is a 67 y.o. White or Caucasian female seen in consultation at the request of Dr. Mayford Knife for evaluation of incontinence.    Review of records significant for: Hyst done in 1986 Bladder surgery 1986 Twins were born in [birth year] and reports her uterus was "tacked to the bladder" which then "ripped: in 1985.  Urinary Symptoms: Leaks urine with going from sitting to standing and with urgency. Getting out of the car is a trigger.  Leaks 1-4 time(s) per days.  Pad use: 4 pads per day.   Patient is bothered by UI symptoms.  Day time voids 6.  Nocturia: 2 times per night to void. Voiding dysfunction:  empties bladder well.  Patient does not use a catheter to empty bladder.  When urinating, patient feels she has no difficulties Drinks: 2 cups coffee AM, Wylers Peach Tea, Occasional soda per day  UTIs: 0 UTI's in the last year.   Denies history of blood in urine, kidney or bladder stones, pyelonephritis, bladder cancer, and kidney cancer No results found for the last 90 days.   Pelvic Organ Prolapse Symptoms:                  Patient Denies a feeling of a bulge the vaginal area.    Bowel Symptom: Bowel movements: 3-4 time(s) per week Stool consistency: solid Straining: no.  Splinting: no.  Incomplete evacuation: no.  Patient Admits to accidental bowel leakage / fecal incontinence  Occurs: few time(s) per year  Consistency with leakage: liquid Bowel regimen: stool softener Last colonoscopy: Date n/a, Results 8 polyps [facility] Colonoscopy          Colonoscopy (Every 3 Years) Next due on 05/27/2025    05/27/2022  COLONOSCOPY   Only the  first 1 history entries have been loaded, but more history exists.            Sexual Function Sexually active: no.  Sexual orientation: Straight Pain with sex: No  Pelvic Pain Denies pelvic pain    Past Medical History:  Past Medical History:  Diagnosis Date   Anxiety    Arthritis    Breast cancer (HCC)    left   Cataract    BEGINNING STAGES   CTS (carpal tunnel syndrome)    Depression    Diabetes mellitus    ORAL MED   Family history of breast cancer    GERD (gastroesophageal reflux disease)    Helicobacter pylori gastritis    Hiatal hernia    HNP (herniated nucleus pulposus), lumbar    PT TRYING TO AVOID LUMBAR SURGERY--STATES PAIN IN HER BACK AND SOMETIMES DOWN ONE OR THE OTHER LEG   Hyperlipidemia    Hypertension    Osteopenia    Personal history of radiation therapy    Plantar fasciitis    BILATERAL   Schatzki's ring    Shortness of breath    WITH EXERTION-PT RELATES TO HER WEIGHT   Sleep apnea    MODERATE- PER SLEEP STUDY 06/2010--PT USES CPAP-SETTING IS 11     Past Surgical History:   Past Surgical History:  Procedure Laterality Date   ABDOMINAL HYSTERECTOMY     BREAST LUMPECTOMY  BREAST LUMPECTOMY WITH RADIOACTIVE SEED AND SENTINEL LYMPH NODE BIOPSY Left 03/27/2019   Procedure: LEFT BREAST LUMPECTOMY WITH RADIOACTIVE SEED AND LEFT AXILLARY DEEP SENTINEL LYMPH NODE BIOPSY INJECT BLUE DYE;  Surgeon: Erica Kelp, MD;  Location: [facility] OR;  Service: General;  Laterality: Left;   CARPAL TUNNEL RELEASE     BILATERAL   CHOLECYSTECTOMY     CYST REMOVED     LEFT THUMB AND RT MIDDLE FINGER   TONSILLECTOMY     TRIGGER FINGER REPAIR     UPPER GASTROINTESTINAL ENDOSCOPY       Past OB/GYN History: G3 P2 G3P2002 Menopausal: Yes Hyst 1986 Last pap smear was Hyst 1986 [facility] PAP     This patient has no relevant Health Maintenance data.       Medications: Patient has a current medication list which includes the following prescription(s):  anastrozole, atorvastatin, contour next control, contour next monitor, celebrex, cholecalciferol, dexcom g7 receiver, dexcom g7 sensor, contour next test, humulin r u-500 kwikpen, hydroxyzine, ibuprofen, lisinopril, lyrica, pantoprazole, b-d 3cc luer-lok syr 25gx5/8", trolamine salicylate, vibegron, cyanocobalamin, and mounjaro, and the following Facility-Administered Medications: sodium chloride.   Allergies: Patient is allergic to celecoxib, exenatide, percocet [oxycodone-acetaminophen], acetaminophen, hydrocodone, oxycodone hcl, and oxycodone-acetaminophen.   Social History:  Social History   Tobacco Use   Smoking status: Former    Current packs/day: 0.00    Average packs/day: 1 pack/day for 25.0 years (25.0 ttl pk-yrs)    Types: Cigarettes    Start date: 03/06/1983    Quit date: 03/05/2008    Years since quitting: 15.1    Passive exposure: Current (HUSBAND SMOKES)   Smokeless tobacco: Never   Tobacco comments:    03/2008  Vaping Use   Vaping status: Never Used  Substance Use Topics   Alcohol use: No   Drug use: No    Relationship status: married Patient lives with husband.   Patient is not employed . Regular exercise: No History of abuse: No  Family History:   Family History  Problem Relation Age of Onset   Diabetes Mother    Kidney disease Mother    Dementia Mother    Diabetes Father    Kidney Stones Sister    Other Sister 52       murdered   Other Sister 7       murdered   Diabetes Brother    Kidney Stones Brother    Other Brother        infant death   Kidney cancer Maternal Aunt 36   Breast cancer Maternal Aunt        dx over 18   Breast cancer Paternal Aunt 15   Breast cancer Paternal Aunt 66   Breast cancer Paternal Aunt        dx over 105   Other Daughter        infanct death   Scoliosis Son    Colon cancer Neg Hx    Colon polyps Neg Hx    Esophageal cancer Neg Hx    Ulcerative colitis Neg Hx    Stomach cancer Neg Hx    Rectal cancer Neg Hx       Review of Systems: Review of Systems  Constitutional:  Positive for malaise/fatigue. Negative for chills and fever.  Respiratory:  Positive for shortness of breath. Negative for cough.   Cardiovascular:  Negative for chest pain and palpitations.  Gastrointestinal:  Negative for abdominal pain, blood in stool, constipation and diarrhea.  Skin:  Negative for  rash.  Neurological:  Negative for weakness.  Psychiatric/Behavioral:  Positive for depression. Negative for suicidal ideas.      OBJECTIVE Physical Exam: Vitals:   04/28/23 0822  BP: 137/82  Pulse: 80  Weight: 230 lb 12.8 oz (104.7 kg)  Height: 5' 3.5" (1.613 m)    Physical Exam Vitals reviewed. Exam conducted with a chaperone present.  Constitutional:      Appearance: Normal appearance.  Pulmonary:     Effort: Pulmonary effort is normal.  Abdominal:     Palpations: Abdomen is soft.  Skin:    General: Skin is warm and dry.  Neurological:     General: No focal deficit present.     Mental Status: She is alert and oriented to person, place, and time.  Psychiatric:        Mood and Affect: Mood normal.        Behavior: Behavior normal. Behavior is cooperative.        Thought Content: Thought content normal.      GU / Detailed Urogynecologic Evaluation:  Pelvic Exam: Normal external female genitalia; Bartholin's and Skene's glands normal in appearance; urethral meatus normal in appearance, no urethral masses or discharge.   CST: negative  s/p hysterectomy: Speculum exam reveals normal vaginal mucosa with  atrophy and normal vaginal cuff.  Adnexa normal adnexa.    With apex supported, anterior compartment defect was reduced  Pelvic floor strength II/V  Pelvic floor musculature: Right levator non-tender, Right obturator tender, Left levator non-tender, Left obturator non-tender  POP-Q:   POP-Q  -2.5                                            Aa   -2.5                                           Ba  -7                                               C   3                                            Gh  5.5                                            Pb  9                                            tvl   -2                                            Ap  -2  Bp                                                 D      Rectal Exam:  Normal external exam  Post-Void Residual (PVR) by Bladder Scan: In order to evaluate bladder emptying, we discussed obtaining a postvoid residual and patient agreed to this procedure.  Procedure: The ultrasound unit was placed on the patient's abdomen in the suprapubic region after the patient had voided.    Post Void Residual - 04/28/23 0843       Post Void Residual   Post Void Residual 28 mL              Laboratory Results: Lab Results  Component Value Date   COLORU yellow 04/28/2023   CLARITYU clear 04/28/2023   GLUCOSEUR Negative 04/28/2023   BILIRUBINUR negative 04/28/2023   KETONESU negative 04/28/2023   SPECGRAV 1.020 04/28/2023   RBCUR negative 04/28/2023   PHUR 6.0 04/28/2023   PROTEINUR Negative 04/28/2023   UROBILINOGEN 0.2 04/28/2023   LEUKOCYTESUR Negative 04/28/2023    Lab Results  Component Value Date   CREATININE 0.87 04/25/2023   CREATININE 1.20 (H) 02/15/2023   CREATININE 1.00 12/28/2022    Lab Results  Component Value Date   HGBA1C 8.9 (H) 03/22/2019  Most recent reported to be approximately 10 this year  Lab Results  Component Value Date   HGB 11.5 (L) 04/25/2023     ASSESSMENT AND PLAN Ms. Thigpen is a 67 y.o. with:  1. Urinary frequency   2. OAB (overactive bladder)   3. SUI (stress urinary incontinence, female)   4. Pelvic floor dysfunction in female   5. Vaginal atrophy    We discussed the symptoms of overactive bladder (OAB), which include urinary urgency, urinary frequency, nocturia, with or without urge incontinence.  While we do not know the exact  etiology of OAB, several treatment options exist. We discussed management including behavioral therapy (decreasing bladder irritants, urge suppression strategies, timed voids, bladder retraining), physical therapy, medication; for refractory cases posterior tibial nerve stimulation, sacral neuromodulation, and intravesical botulinum toxin injection. For anticholinergic medications, we discussed the potential side effects of anticholinergics including dry eyes, dry mouth, constipation, cognitive impairment and urinary retention. For Beta-3 agonist medication, we discussed the potential side effect of elevated blood pressure which is more likely to occur in individuals with uncontrolled hypertension. Patient is not a good candidate for anticholinergics due to age and risk of memory impairment. Will start her on Beta 3 agonist Gemtesa 75mg  daily as it is better tolerated and decreased risk of elevated blood pressures.  SUI not as bothersome as UUI. If her SUI symptoms become more bothersome we can discuss treatment options such as surgical sling or urethral bulking.  Patient has significant pelvic floor muscle pain on the right obturator muscle. Patient has a large amount of stress in her life and she has been dealing anxiety and depression as well. We discussed that Pelvic floor PT would be helpful for her pain in the pelvic floor as well as some of her OAB symptoms. Referral placed for pelvic floor PT.  Patient has obvious vaginal atrophy on exam. She does has a hx of breast cancer so is weary about using vaginal estrogen and she is not currently sexually active. She prefers to use coconut oil. We discussed using  this nightly to treat the atrophy inside the vagina.   Patient to return in 8 weeks after she has completed some PT.    Selmer Dominion, NP

## 2023-04-28 NOTE — Patient Instructions (Addendum)
Today we talked about ways to manage bladder urgency such as altering your diet to avoid irritative beverages and foods (bladder diet) as well as attempting to decrease stress and other exacerbating factors.  You can also chew a plain Tums 1-3 times per day to make your urine less acidic, especially if you have eating/drinking acidic things.   There is a website with helpful information for people with bladder irritation, called the IC Network at https://www.ic-network.com. This website has more information about a healthy bladder diet and patient forums for support.  The Most Bothersome Foods* The Least Bothersome Foods*  Coffee - Regular & Decaf Tea - caffeinated Carbonated beverages - cola, non-colas, diet & caffeine-free Alcohols - Beer, Red Wine, White Wine, [address] - Grapefruit, [place], Orange, [employer] - Cranberry, Grapefruit, Orange, Pineapple Vegetables - Tomato & Tomato Products Flavor Enhancers - Hot peppers, Spicy foods, Chili, Horseradish, Vinegar, Monosodium glutamate (MSG) Artificial Sweeteners - NutraSweet, Sweet 'N Low, Equal (sweetener), Saccharin Ethnic foods - [place], [place], [place] food [employer] - low-fat & whole Fruits - Bananas, Blueberries, Honeydew melon, Pears, Raisins, Watermelon Vegetables - Broccoli, [address] Sprouts, [place], Carrots, Cauliflower, [place], Cucumber, Mushrooms, Peas, Radishes, Squash, Zucchini, White potatoes, Sweet potatoes & yams Poultry - Chicken, Eggs, [place], [employer] - Beef, Diplomatic Services operational officer, Lamb Seafood - Shrimp, [place] fish, Salmon Grains - Oat, Rice Snacks - Pretzels, Popcorn  *Lenward Chancellor et al. Diet and its role in interstitial cystitis/bladder pain syndrome (IC/BPS) and comorbid conditions. [employer]. [employer]JU Int. 2012 Jan 11.    Do coconut oil at nighttime. You want to put a blueberry sized amount on your finger and rub it as high into the vagina as you can. If you decide you want to do vaginal estrogen please let me  know. The other option is to use vitamin E cream.   The muscle in your right hip was having spasms during your exam. Pelvic floor PT can help with this.   You have a stage 1 rectocele which means a small amount of laxity on the back vaginal wall. Try to use a stool or squatty potty when you have bowel movements to decrease strain on the pelvic floor.   If you do try to have intercourse please use a silicon based lubricant.

## 2023-05-01 NOTE — Progress Notes (Deleted)
[facility] [address], [place] [address]   CLINIC:  Medical Oncology/Hematology  PCP:  Practice, [facility] [address] Erica Giles [phone]   REASON FOR VISIT:  Follow-up for lung nodules with history of breast cancer   PRIOR THERAPY:  Left lumpectomy on 03/27/2019 XRT completed on 07/18/2019   NGS Results: Not done   CURRENT THERAPY: Anastrozole  BRIEF ONCOLOGIC HISTORY:   Oncology History  Breast cancer of upper-outer quadrant of left female breast (HCC)  03/06/2019 Initial Diagnosis   Breast cancer of upper-outer quadrant of left female breast (HCC)   04/24/2019 Cancer Staging   Staging form: Breast, AJCC 8th Edition - Clinical: Stage IB (cT1c, cN1, cM0, G2, ER+, PR+, HER2-) - Signed by Doreatha Massed, MD on 04/24/2019   07/11/2019 Genetic Testing   Negative genetic testing on the common hereditary cancer panel.  The Common Hereditary Gene Panel offered by [facility] includes sequencing and/or deletion duplication testing of the following 48 genes: APC, ATM, AXIN2, BARD1, BMPR1A, BRCA1, BRCA2, BRIP1, CDH1, CDK4, CDKN2A (p14ARF), CDKN2A (p16INK4a), CHEK2, CTNNA1, DICER1, EPCAM (Deletion/duplication testing only), GREM1 (promoter region deletion/duplication testing only), KIT, MEN1, MLH1, MSH2, MSH3, MSH6, MUTYH, NBN, NF1, NHTL1, PALB2, PDGFRA, PMS2, POLD1, POLE, PTEN, RAD50, RAD51C, RAD51D, RNF43, SDHB, SDHC, SDHD, SMAD4, SMARCA4. STK11, TP53, TSC1, TSC2, and VHL.  The following genes were evaluated for sequence changes only: SDHA and HOXB13 c.251G>A variant only. The report date is July 11, 2019.        CANCER STAGING:  Cancer Staging  Breast cancer of upper-outer quadrant of left female breast (HCC) Staging form: Breast, AJCC 8th Edition - Clinical: Stage IB (cT1c, cN1, cM0, G2, ER+, PR+, HER2-) - Signed by Doreatha Massed, MD on 04/24/2019   INTERVAL HISTORY:   [name], a 67 y.o. female, returns for  routine follow-up of her lung nodules and history of breast cancer. Erica Giles was last seen on 10/14/2022 by Rojelio Brenner PA-C.    At today's visit, she  reports feeling ***. *** She has not noticed any new breast lumps.    *** She has no new abdominal pain, headaches, seizures, or focal neurologic deficits. *** She denies any new dyspnea, cough, hemoptysis, or voice changes. *** She denies any recent infections or antibiotic use. *** No B symptoms such as fever, chills, unintentional weight loss. *** She does report some intermittent dull ache in her central chest, and has been worked up by cardiology.  ***  She continues to take Arimidex daily and is tolerating this fairly well.  *** ***She has ongoing musculoskeletal pain that she attributes to arthritis.  *** *** She is taking pregabalin for her neuropathy. *** She does also report some hot flashes and mild night sweats, reportedly "tolerable." *** Calcium and vitamin D?  (Not on Fosamax since 2023?) *** Bone pain or fractures?  She had been taking iron supplement daily with stool softener for the past 6 months.  *** *** Reports dark stools since starting oral iron, but denies any frank melena or hematochezia. *** She has had some fatigue and restless legs.    She reports 40***% energy and 100***% appetite.  She is maintaining stable weight at this time.   ASSESSMENT & PLAN:  1.  Stage Ib (T1CN1) left breast IDC: - Initial diagnosis September 2020, with a screening mammogram (02/16/2019) showing suspicious 0.9 cm mass in the 230 to 3 o'clock position of the left breast - Ultrasound-guided biopsy (02/20/2019):  Grade 2 invasive ductal carcinoma, positive for ER/PR and negative for HER2. - Lumpectomy and sentinel lymph node biopsy by Dr. Derrell Lolling on 03/17/2019.  Pathology showed grade 2 IDC, 1.1 cm, negative margins, negative LVSI/perineural invasion, 1/1 lymph node positive for metastatic carcinoma measuring 2.7 mm. PT1CPN1A.  ER 100%, PR  100%, Ki-67 2%, HER2 2+, negative by FISH.  - Oncotype DX results showed recurrence score of 8.  No apparent benefit from chemotherapy.  9-year breast cancer specific survival is 98%. - Completed radiation therapy on 07/18/2019 - She started anastrozole on 04/24/2019.  She is continuing anastrozole without any major problems apart from some increased arthralgia. - BCI testing (10/21/2022) shows no benefit from extended endocrine therapy.  Risk of late distant recurrence is 8.3% regardless of 5 versus 10 years of adjuvant endocrine therapy.  Goal of anastrozole is 5 years, to be completed in November 2025.  *** - Most recent mammogram, screening (03/07/2023): BI-RADS Category 1, negative - Physical examination today (05/02/2023) *** did not reveal any palpable masses.    She has some mild edema and tenderness of left lateral breast, near lumpectomy scar. - No red flag symptoms of recurrence.*** - Most recent labs (04/25/2023): Overall unremarkable CBC, normal LFTs.   - PLAN: Annual screening mammogram next due in September 2025. - Repeat labs and RTC in 6 months for office visit and physical exam.    - Continue anastrozole.   2.  Bilateral lung nodules: - She had a fall on 08/22/2021 with left lower rib fractures. - CT chest with contrast (09/10/2021): Multiple bilateral lung nodules, largest irregular nodule of the posterior inferior right upper lobe measuring 1.7 x 1.0 cm, 0.4 cm nodule in the left apex, right lower lobe 0.4 cm lung nodule. - PET scan (10/01/2021) did not show uptake in the 1.7 cm irregular nodular lesion in the right upper lobe.  No lymphadenopathy. - CT chest with contrast (04/12/2022): Stable 1.7 cm multilobular nodule in the posterior right upper lobe.  Other scattered tiny bilateral lung nodules are unchanged when compared to scan from 09/10/2021.  No mediastinal or hilar adenopathy. - Follow-up CT chest (04/25/2023) *** -Denies any dyspnea, cough, hemoptysis, or voice changes.  No  B-symptoms or unintentional weight loss.*** - PLAN: Will follow results of CT chest from 04/25/2023, further plan TBD.  ***  3.  Iron deficiency anemia, mild  - Labs from 10/06/2022 showed Hgb 11.6/MCV 93.8, ferritin 27, iron saturation 19% - EGD/colonoscopy (05/12/2022): Colonoscopy revealed multiple polyps (tubular adenoma) and internal hemorrhoids.  Endoscopy revealed esophageal inflammation, Schatzki ring, 3 cm hiatal hernia. - No rectal bleeding or melena.*** - Admits to fatigue.*** - She has been taking iron tablet daily for the past year without improvement. - Received Feraheme x 2 in May 2024. - Most recent labs (04/25/2023): Hgb 11.5/MCV 94.3, ferritin 98, iron saturation 23%. - PLAN: No indication for IV iron at this time. - Repeat CBC, B12, MMA, iron panel at follow-up in 6 months.   4.  Bone health: - DEXA scan on 04/19/2019 shows T score of -1. - DEXA scan on 04/20/2021 shows T score of -1.3, mild osteopenia - Most recent DEXA scan (04/25/2023): T-score -1.4, stable mild osteopenia  - Started on Fosamax in September 2022, but patient stopped taking this in 2023.   - Most recent vitamin D (04/25/2023) is low at 28.86 *** - She is taking calcium and vitamin D *** - PLAN: *** Continue calcium, vitamin D, and weightbearing exercises for bone loss associated  with breast cancer therapy. - Next DEXA scan due November 2026.  If any worsening, will consider reintroducing bisphosphonates/Prolia.  5.  Other history: - She works at [employer]  She quit smoking in 2009 after smoking 1 PPD x 20 years. - Paternal aunts x 3 had breast cancer.  Maternal aunt with breast cancer.  [facility] germline mutation testing was negative.  PLAN SUMMARY:*** >> ***    REVIEW OF SYSTEMS: ***  Review of Systems  Constitutional:  Positive for fatigue. Negative for appetite change, chills, diaphoresis, fever and unexpected weight change.  HENT:   Negative for lump/mass and nosebleeds.   Eyes:  Negative for  eye problems.  Respiratory:  Negative for cough, hemoptysis and shortness of breath.   Cardiovascular:  Positive for chest pain (Intermittent, none today) and leg swelling. Negative for palpitations.  Gastrointestinal:  Negative for abdominal pain, blood in stool, constipation, diarrhea, nausea and vomiting.  Genitourinary:  Negative for hematuria.   Skin: Negative.   Neurological:  Positive for dizziness and numbness (Neuropathy). Negative for headaches and light-headedness.  Hematological:  Does not bruise/bleed easily.  Psychiatric/Behavioral:  Positive for depression.     PHYSICAL EXAM:   Performance status (ECOG): 1 - Symptomatic but completely ambulatory *** There were no vitals filed for this visit. Wt Readings from Last 3 Encounters:  04/28/23 230 lb 12.8 oz (104.7 kg)  12/28/22 237 lb 3.2 oz (107.6 kg)  10/14/22 236 lb (107 kg)   Physical Exam Constitutional:      Appearance: Normal appearance. She is obese.  HENT:     Head: Normocephalic and atraumatic.     Mouth/Throat:     Mouth: Mucous membranes are moist.  Eyes:     Extraocular Movements: Extraocular movements intact.     Pupils: Pupils are equal, round, and reactive to light.  Cardiovascular:     Rate and Rhythm: Normal rate and regular rhythm.     Pulses: Normal pulses.     Heart sounds: Normal heart sounds.  Pulmonary:     Effort: Pulmonary effort is normal.     Breath sounds: Normal breath sounds.  Chest:  Breasts:    Right: Normal.     Left: No swelling, inverted nipple, mass, nipple discharge, skin change or tenderness.       Comments: Mild edema and tenderness of left lateral breast, near lumpectomy scar.  No discrete nodules or masses in either breast. Abdominal:     General: Bowel sounds are normal.     Palpations: Abdomen is soft.     Tenderness: There is no abdominal tenderness.  Musculoskeletal:        General: No swelling.     Right lower leg: Edema present.     Left lower leg: Edema  present.     Comments: 1+ edema of left calf/pretibial region. 1+ edema of right foot.   Lymphadenopathy:     Cervical: No cervical adenopathy.     Upper Body:     Right upper body: No supraclavicular, axillary or pectoral adenopathy.     Left upper body: No supraclavicular, axillary or pectoral adenopathy.  Skin:    General: Skin is warm and dry.  Neurological:     General: No focal deficit present.     Mental Status: She is alert and oriented to person, place, and time.  Psychiatric:        Mood and Affect: Mood normal.        Behavior: Behavior normal.     PAST  MEDICAL/SURGICAL HISTORY:  Past Medical History:  Diagnosis Date   Anxiety    Arthritis    Breast cancer (HCC)    left   Cataract    BEGINNING STAGES   CTS (carpal tunnel syndrome)    Depression    Diabetes mellitus    ORAL MED   Family history of breast cancer    GERD (gastroesophageal reflux disease)    Helicobacter pylori gastritis    Hiatal hernia    HNP (herniated nucleus pulposus), lumbar    PT TRYING TO AVOID LUMBAR SURGERY--STATES PAIN IN HER BACK AND SOMETIMES DOWN ONE OR THE OTHER LEG   Hyperlipidemia    Hypertension    Osteopenia    Personal history of radiation therapy    Plantar fasciitis    BILATERAL   Schatzki's ring    Shortness of breath    WITH EXERTION-PT RELATES TO HER WEIGHT   Sleep apnea    MODERATE- PER SLEEP STUDY 06/2010--PT USES CPAP-SETTING IS 11   Past Surgical History:  Procedure Laterality Date   ABDOMINAL HYSTERECTOMY     BREAST LUMPECTOMY     BREAST LUMPECTOMY WITH RADIOACTIVE SEED AND SENTINEL LYMPH NODE BIOPSY Left 03/27/2019   Procedure: LEFT BREAST LUMPECTOMY WITH RADIOACTIVE SEED AND LEFT AXILLARY DEEP SENTINEL LYMPH NODE BIOPSY INJECT BLUE DYE;  Surgeon: Claud Kelp, MD;  Location: [facility] OR;  Service: General;  Laterality: Left;   CARPAL TUNNEL RELEASE     BILATERAL   CHOLECYSTECTOMY     CYST REMOVED     LEFT THUMB AND RT MIDDLE FINGER   TONSILLECTOMY      TRIGGER FINGER REPAIR     UPPER GASTROINTESTINAL ENDOSCOPY      SOCIAL HISTORY:  Social History   Socioeconomic History   Marital status: Married    Spouse name: Kenslee Chromy   Number of children: 2   Years of education: GED   Highest education level: Not on file  Occupational History   Occupation: Radiographer, therapeutic: [employer]  Tobacco Use   Smoking status: Former    Current packs/day: 0.00    Average packs/day: 1 pack/day for 25.0 years (25.0 ttl pk-yrs)    Types: Cigarettes    Start date: 03/06/1983    Quit date: 03/05/2008    Years since quitting: 15.1    Passive exposure: Current (HUSBAND SMOKES)   Smokeless tobacco: Never   Tobacco comments:    03/2008  Vaping Use   Vaping status: Never Used  Substance and Sexual Activity   Alcohol use: No   Drug use: No   Sexual activity: Not Currently    Birth control/protection: Post-menopausal    Comment: hysterectomy  Other Topics Concern   Not on file  Social History Narrative   Lives at home with her husband.   Right-handed.   Caffeine use: 20 ounces per day.   Social Determinants of Health   Financial Resource Strain: Low Risk  (05/01/2019)   Received from [facility], [facility]   Overall Financial Resource Strain (CARDIA)    Difficulty of Paying Living Expenses: Not very hard  Recent Concern: Financial Resource Strain - Medium Risk (03/06/2019)   Overall Financial Resource Strain (CARDIA)    Difficulty of Paying Living Expenses: Somewhat hard  Food Insecurity: Unknown (05/01/2019)   Received from [facility], [facility]   Hunger Vital Sign    Worried About Running Out of Food in the Last Year: Patient declined  Ran Out of Food in the Last Year: Patient declined  Transportation Needs: No Transportation Needs (05/01/2019)   Received from [facility], [facility]   [facility] - Transportation    Lack of Transportation (Medical): No    Lack of Transportation (Non-Medical):  No  Physical Activity: Unknown (05/01/2019)   Received from [facility], [facility]   Exercise Vital Sign    Days of Exercise per Week: Patient declined    Minutes of Exercise per Session: Patient declined  Recent Concern: Physical Activity - Inactive (03/06/2019)   Exercise Vital Sign    Days of Exercise per Week: 0 days    Minutes of Exercise per Session: 0 min  Stress: No Stress Concern Present (05/01/2019)   Received from [facility], [facility] of Occupational Health - Occupational Stress Questionnaire    Feeling of Stress : Only a little  Recent Concern: Stress - Stress Concern Present (03/06/2019)   [employer] of Occupational Health - Occupational Stress Questionnaire    Feeling of Stress : To some extent  Social Connections: Unknown (10/16/2021)   Received from [facility], [facility]   Social Network    Social Network: Not on file  Intimate Partner Violence: Unknown (09/08/2021)   Received from [facility], [facility]   HITS    Physically Hurt: Not on file    Insult or Talk Down To: Not on file    Threaten Physical Harm: Not on file    Scream or Curse: Not on file    FAMILY HISTORY:  Family History  Problem Relation Age of Onset   Diabetes Mother    Kidney disease Mother    Dementia Mother    Diabetes Father    Kidney Stones Sister    Other Sister 17       murdered   Other Sister 7       murdered   Diabetes Brother    Kidney Stones Brother    Other Brother        infant death   Kidney cancer Maternal Aunt 52   Breast cancer Maternal Aunt        dx over 15   Breast cancer Paternal Aunt 54   Breast cancer Paternal Aunt 41   Breast cancer Paternal Aunt        dx over 34   Other Daughter        infanct death   Scoliosis Son    Colon cancer Neg Hx    Colon polyps Neg Hx    Esophageal cancer Neg Hx    Ulcerative colitis Neg Hx    Stomach cancer Neg Hx    Rectal cancer Neg Hx     CURRENT MEDICATIONS:   Current Outpatient Medications  Medication Sig Dispense Refill   anastrozole (ARIMIDEX) 1 MG tablet TAKE ONE TABLET BY MOUTH ONCE DAILY 90 tablet 1   atorvastatin (LIPITOR) 80 MG tablet Take 80 mg by mouth daily.     Blood Glucose Calibration (CONTOUR NEXT CONTROL) Normal SOLN      Blood Glucose Monitoring Suppl (CONTOUR NEXT MONITOR) w/Device KIT      CELEBREX 100 MG capsule Take 1 capsule twice a day by oral route as needed for 28 days.     cholecalciferol (VITAMIN D3) 25 MCG (1000 UT) tablet Take 2,000-4,000 Units by mouth See admin instructions. 2000 mcg daily, except once a week, take 4000 mcg daily  Continuous Glucose Receiver (DEXCOM G7 RECEIVER) DEVI USE TO MONITOR BLOOD GLUCOSE     Continuous Glucose Sensor (DEXCOM G7 SENSOR) MISC SMARTSIG:1 Topical     cyanocobalamin (,VITAMIN B-12,) 1000 MCG/ML injection Inject 1,000 mcg into the muscle every 30 (thirty) days.  (Patient not taking: Reported on 04/28/2023)     glucose blood (CONTOUR NEXT TEST) test strip      HUMULIN R U-500 KWIKPEN 500 UNIT/ML KwikPen Inject into the skin.     hydrOXYzine (ATARAX) 10 MG tablet hydroxyzine HCl 10 mg tablet  TAKE ONE TABLET BY MOUTH EVERY 8 HOURS AS NEEDED FOR ANXIETY     ibuprofen (ADVIL) 800 MG tablet as needed.      lisinopril (PRINIVIL,ZESTRIL) 20 MG tablet Take 20 mg by mouth at bedtime.     LYRICA 75 MG capsule Take 1 capsule 3 times a day by oral route for 30 days.     pantoprazole (PROTONIX) 40 MG tablet Take 1 tablet (40 mg total) by mouth 2 (two) times daily. (Patient taking differently: Take 40 mg by mouth daily.) 60 tablet 11   SYRINGE-NEEDLE, DISP, 3 ML (B-D 3CC LUER-LOK SYR 25GX5/8") 25G X 5/8" 3 ML MISC BD Luer-Lok Syringe 3 mL 25 x 5/8"  USE TO INJECT VITAMIN B12 ONCE A MONTH.     tirzepatide (MOUNJARO) 2.5 MG/0.5ML Pen Inject 2.5 mg into the skin once a week. (Patient not taking: Reported on 04/28/2023) 2 mL 0   trolamine salicylate (ASPERCREME) 10 % cream Apply 1 Application  topically as needed for muscle pain.     Vibegron 75 MG TABS Take 1 tablet (75 mg total) by mouth daily. 30 tablet 5   Current Facility-Administered Medications  Medication Dose Route Frequency Provider Last Rate Last Admin   0.9 %  sodium chloride infusion  500 mL Intravenous Continuous Pyrtle, Carie Caddy, MD        ALLERGIES:  Allergies  Allergen Reactions   Celecoxib Other (See Comments)   Exenatide     Other reaction(s): knots at injection sites for months   Percocet [Oxycodone-Acetaminophen] Itching    PT CAN TAKE WITH BENADRYL   Acetaminophen Rash    Other reaction(s): Other (See Comments)   Hydrocodone Itching    PT WOULD TAKE WITH BENADRYL PT WOULD TAKE WITH BENADRYL   Oxycodone Hcl Rash   Oxycodone-Acetaminophen Itching    PT CAN TAKE WITH BENADRYL    LABORATORY DATA:  I have reviewed the labs as listed.     Latest Ref Rng & Units 04/25/2023    8:49 AM 10/06/2022    9:32 AM 04/12/2022    8:40 AM  CBC  WBC 4.0 - 10.5 K/uL 5.2  6.5  5.5   Hemoglobin 12.0 - 15.0 g/dL 44.0  10.2  72.5   Hematocrit 36.0 - 46.0 % 34.5  36.0  33.0   Platelets 150 - 400 K/uL 175  218  195       Latest Ref Rng & Units 04/25/2023    8:49 AM 02/15/2023    4:41 PM 12/28/2022   12:07 PM  CMP  Glucose 70 - 99 mg/dL 366   440   BUN 8 - 23 mg/dL 12   13   Creatinine 3.47 - 1.00 mg/dL 4.25  9.56  3.87   Sodium 135 - 145 mmol/L 137   137   Potassium 3.5 - 5.1 mmol/L 3.8   4.7   Chloride 98 - 111 mmol/L 102   101   CO2  22 - 32 mmol/L 26   25   Calcium 8.9 - 10.3 mg/dL 9.3   9.4   Total Protein 6.5 - 8.1 g/dL 6.3     Total Bilirubin <1.2 mg/dL 0.3     Alkaline Phos 38 - 126 U/L 95     AST 15 - 41 U/L 20     ALT 0 - 44 U/L 25       DIAGNOSTIC IMAGING:  I have independently reviewed the scans and discussed with the patient. DG Bone Density  Result Date: 04/25/2023 EXAM: DUAL X-RAY ABSORPTIOMETRY (DXA) FOR BONE MINERAL DENSITY IMPRESSION: Your patient Julianne Godfrey completed a BMD test on  04/25/2023 using the [employer] DXA System (software version: 14.10) manufactured by [employer]. The following summarizes the results of our evaluation. Technologist: [facility] PATIENT BIOGRAPHICAL: Name: Marine, Lojek C Patient ID: [id number] Birth Date: 1955/09/15 Height: 65.0 in. Gender: Female Exam Date: 04/25/2023 Weight: 237.2 lbs. Indications: Caucasian, Diabetic-insulin dependent, Family Hist. (Parent hip fracture), Height Loss, History of Fracture (Adult), Hx Breast Ca, Parent Hip Fracture, Partial Hysterectomy, Post Menopausal, Secondary Osteoporosis Fractures: Humerus, Wrist Treatments: Anastrozole, Calcium, Vitamin D DENSITOMETRY RESULTS: Site          Region     Measured Date Measured Age WHO Classification Young Adult T-score BMD         %Change vs. Previous Significant Change (*) DualFemur Neck Left 04/25/2023 66.2 Osteopenia -1.4 0.839 g/cm2 -2.3% - DualFemur Neck Left 04/20/2022 64.2 Osteopenia -1.3 0.859 g/cm2 -3.7% - DualFemur Neck Left 5/19/2019 62.2 Normal -1.0 0.892 g/cm2 - - DualFemur Total Mean 04/25/2023 66.2 Normal -0.6 0.926 g/cm2 3.0% Yes DualFemur Total Mean 04/20/2021 64.2 Normal -0.9 0.899 g/cm2 -4.4% Yes DualFemur Total Mean 04/19/2019 62.2 Normal -0.5 0.940 g/cm2 - - Right Forearm Radius 33% 04/25/2023 66.2 Osteopenia -1.4 0.613 g/cm2 -4.0% - Right Forearm Radius 33% 04/20/2021 64.2 Normal -1.0 0.639 g/cm2 - - ASSESSMENT: The BMD measured at Femur Neck Left is 0.839 g/cm2 with a T-score of -1.4. This patient is considered osteopenic according to World Health Organization [facility]) criteria. The scan quality is good. Lumbar spine was excluded due to advanced degenerative changes. Compared with the prior study on 04/20/2021, the BMD of the total mean shows a statistically significant increase. World Science writer [facility]) criteria for post-menopausal, Caucasian Women: Normal:       T-score at or above -1 SD Osteopenia:   T-score between -1 and -2.5 SD Osteoporosis:  T-score at or below -2.5 SD RECOMMENDATIONS: 1. All patients should optimize calcium and vitamin D intake. 2. Consider FDA-approved medical therapies in postmenopausal women and med aged 51 years and older, based on the following: a. A hip or vertebral (clinical or morphometric) fracture b. T-score< -2.5 at the femoral neck or spine after appropriate evaluation to exclude secondary causes c. Low bone mass (T-score between -1.0 and -2.5 at the femoral neck or spine) and a 10-year probability of a hip fracture > 3% or a 10-year probability of a major osteoporosis-related fracture > 20% based on the [place]-adapted WHO algorithm d. Clinician judgment and/or patient preferences may indicate treatment for people with 10-year fracture probabilities above or below these levels FOLLOW-UP: Patients with diagnosis of osteoporsis or at high risk for fracture should have regular bone mineral density tests. For patients eligible for Medicare, routine testing is allowed once every 2 years. The testing frequency can be increased to one year for patients who have rapidly progressing disease, those who are receiving  or discontinuing medical therapy to restore bone mass, or have additional risk factors. I have reviewed this report, and agree with the above findings. [facility] Your patient Nayeliz C Wire completed a FRAX assessment on 04/25/2023 using the [employer] DXA System (analysis version: 14.10) manufactured by [employer]. The following summarizes the results of our evaluation. PATIENT BIOGRAPHICAL: Name: Damesha, Postlethwaite C Patient ID: [id number] Birth Date: [birth year] Height:    65.0 in. Gender:     Female    [age]       Weight:    237.2 lbs. Ethnicity:  White                            Exam Date: 04/25/2023 FRAX* RESULTS:  (version: 3.5) 10-year Probability of Fracture1 Major Osteoporotic Fracture2 Hip Fracture 23.7% 1.6% Population: [place] (Caucasian) Risk Factors: Family Hist. (Parent hip fracture),  History of Fracture (Adult), Secondary Osteoporosis Based on DualFemur (Left) Neck BMD 1 -The 10-year probability of fracture may be lower than reported if the patient has received treatment. 2 -Major Osteoporotic Fracture: Clinical Spine, Forearm, Hip or Shoulder *FRAX is a Armed forces logistics/support/administrative officer of the [employer] of [employer] for Metabolic Bone Disease, a World Science writer (WHO) [employer]. ASSESSMENT: The probability of a major osteoporotic fracture is 23.7% within the next ten years. The probability of a hip fracture is 1.6% within the next ten years. Electronically Signed   By: Romona Curls M.D.   On: 04/25/2023 09:56     WRAP UP:  All questions were answered. The patient knows to call the clinic with any problems, questions or concerns.  Medical decision making: Moderate  Time spent on visit: I spent 20 minutes counseling the patient face to face. The total time spent in the appointment was 30 minutes and more than 50% was on counseling.  Carnella Guadalajara, PA-C  ***

## 2023-05-02 ENCOUNTER — Inpatient Hospital Stay: Payer: Medicare Other | Admitting: Physician Assistant

## 2023-05-13 ENCOUNTER — Ambulatory Visit: Payer: Medicare Other | Admitting: Obstetrics and Gynecology

## 2023-05-16 NOTE — Progress Notes (Unsigned)
[facility] [address], [place] [address]   CLINIC:  Medical Oncology/Hematology  PCP:  Practice, [facility] [address] [place] / [place] [address] [phone]   REASON FOR VISIT:  Follow-up for lung nodules with history of breast cancer   PRIOR THERAPY:  Left lumpectomy on 03/27/2019 XRT completed on 07/18/2019   NGS Results: Not done   CURRENT THERAPY: Anastrozole  BRIEF ONCOLOGIC HISTORY:   Oncology History  Breast cancer of upper-outer quadrant of left female breast (HCC)  03/06/2019 Initial Diagnosis   Breast cancer of upper-outer quadrant of left female breast (HCC)   04/24/2019 Cancer Staging   Staging form: Breast, AJCC 8th Edition - Clinical: Stage IB (cT1c, cN1, cM0, G2, ER+, PR+, HER2-) - Signed by Erica Massed, MD on 04/24/2019   07/11/2019 Genetic Testing   Negative genetic testing on the common hereditary cancer panel.  The Common Hereditary Gene Panel offered by [facility] includes sequencing and/or deletion duplication testing of the following 48 genes: APC, ATM, AXIN2, BARD1, BMPR1A, BRCA1, BRCA2, BRIP1, CDH1, CDK4, CDKN2A (p14ARF), CDKN2A (p16INK4a), CHEK2, CTNNA1, DICER1, EPCAM (Deletion/duplication testing only), GREM1 (promoter region deletion/duplication testing only), KIT, MEN1, MLH1, MSH2, MSH3, MSH6, MUTYH, NBN, NF1, NHTL1, PALB2, PDGFRA, PMS2, POLD1, POLE, PTEN, RAD50, RAD51C, RAD51D, RNF43, SDHB, SDHC, SDHD, SMAD4, SMARCA4. STK11, TP53, TSC1, TSC2, and VHL.  The following genes were evaluated for sequence changes only: SDHA and HOXB13 Erica Giles.251G>A variant only. The report date is July 11, 2019.        CANCER STAGING:  Cancer Staging  Breast cancer of upper-outer quadrant of left female breast (HCC) Staging form: Breast, AJCC 8th Edition - Clinical: Stage IB (cT1c, cN1, cM0, G2, ER+, PR+, HER2-) - Signed by Erica Massed, MD on 04/24/2019   INTERVAL HISTORY:   Ms. Erica Erica Giles, a 67 y.o. female, returns for  routine follow-up of her lung nodules and history of breast cancer. Erica Erica Giles was last seen on 10/14/2022 by Erica Brenner PA-Erica Giles.    At today's visit, she  reports feeling fairly well.  She has not noticed any new breast lumps.   She has no new abdominal pain, headaches, seizures, or focal neurologic deficits.  She denies any new dyspnea, cough, hemoptysis, or voice changes. She denies any recent infections or antibiotic use.  No B symptoms such as fever, chills, unintentional weight loss.  She denies any recent chest pain.  She continues to take Arimidex daily and is tolerating this fairly well.  She has ongoing musculoskeletal pain that she attributes to arthritis.   She is taking pregabalin for her neuropathy.  She does also report some hot flashes and mild night sweats, reportedly "tolerable."  She takes daily calcium/vitamin D.  She denies any recent bone pain or fractures.  She had been taking iron supplement daily with stool softener for the past 6 months.  Reports dark stools since starting oral iron, but denies any frank melena or hematochezia.  She has had some mild fatigue and restless legs.    She reports 75% energy and 100% appetite.  She is maintaining stable weight at this time.  ASSESSMENT & PLAN:  1.  Stage Ib (T1CN1) left breast IDC: - Initial diagnosis September 2020, with a screening mammogram (02/16/2019) showing suspicious 0.9 cm mass in the 230 to 3 o'clock position of the left breast - Ultrasound-guided biopsy (02/20/2019): Grade 2 invasive ductal carcinoma, positive for ER/PR and negative for HER2. - Lumpectomy and sentinel lymph node biopsy by Dr.  Ingram on 03/17/2019.  Pathology showed grade 2 IDC, 1.1 cm, negative margins, negative LVSI/perineural invasion, 1/1 lymph node positive for metastatic carcinoma measuring 2.7 mm. PT1CPN1A.  ER 100%, PR 100%, Ki-67 2%, HER2 2+, negative by FISH.  - Oncotype DX results showed recurrence score of 8.  No apparent benefit from chemotherapy.   9-year breast cancer specific survival is 98%. - Completed radiation therapy on 07/18/2019 - She started anastrozole on 04/24/2019.  She is continuing anastrozole without any major problems apart from some increased arthralgia. - BCI testing (10/21/2022) shows no benefit from extended endocrine therapy.  Risk of late distant recurrence is 8.3% regardless of 5 versus 10 years of adjuvant endocrine therapy.  Goal of anastrozole is 5 years, to be completed in November 2025.   - Most recent mammogram, screening (03/07/2023): BI-RADS Category 1, negative - Physical examination today (05/17/2023)  did not reveal any palpable masses.     - No red flag symptoms of recurrence. - Most recent labs (04/25/2023): Overall unremarkable CBC, normal LFTs.   - PLAN: Annual screening mammogram next due in September 2025. - Repeat labs and RTC in 6 months for office visit and physical exam.    - Continue anastrozole.   2.  Bilateral lung nodules: - She had a fall on 08/22/2021 with left lower rib fractures. - CT chest with contrast (09/10/2021): Multiple bilateral lung nodules, largest irregular nodule of the posterior inferior right upper lobe measuring 1.7 x 1.0 cm, 0.4 cm nodule in the left apex, right lower lobe 0.4 cm lung nodule. - PET scan (10/01/2021) did not show uptake in the 1.7 cm irregular nodular lesion in the right upper lobe.  No lymphadenopathy. - CT chest with contrast (04/12/2022): Stable 1.7 cm multilobular nodule in the posterior right upper lobe.  Other scattered tiny bilateral lung nodules are unchanged when compared to scan from 09/10/2021.  No mediastinal or hilar adenopathy. - Follow-up CT chest (04/25/2023): Stable bilateral pulmonary nodules, most pronounced within right upper lobe (1.5 x 0.8 cm).  Other small pulmonary nodules as described.  No new pulmonary nodules or masses. -Denies any dyspnea, cough, hemoptysis, or voice changes.  No B-symptoms or unintentional weight loss. - PLAN: Will plan  on follow-up imaging with repeat CT chest in November 2025   3.  Iron deficiency anemia, mild  - Labs from 10/06/2022 showed Hgb 11.6/MCV 93.8, ferritin 27, iron saturation 19% - EGD/colonoscopy (05/12/2022): Colonoscopy revealed multiple polyps (tubular adenoma) and internal hemorrhoids.  Endoscopy revealed esophageal inflammation, Schatzki ring, 3 cm hiatal hernia. - No rectal bleeding or melena. - Mild fatigue, but with energy improved after IV iron infusions - She has been taking iron tablet daily for the past year without improvement. - Received Feraheme x 2 in May 2024. - Most recent labs (04/25/2023): Hgb 11.5/MCV 94.3, ferritin 98, iron saturation 23%. - PLAN: No indication for IV iron at this time. - Repeat CBC, B12, MMA, iron panel at follow-up in 6 months.   4.  Bone health: - DEXA scan on 04/19/2019 shows T score of -1. - DEXA scan on 04/20/2021 shows T score of -1.3, mild osteopenia - Most recent DEXA scan (04/25/2023): T-score -1.4, stable mild osteopenia  - Started on Fosamax in September 2022, but patient stopped taking this in 2023.   - Most recent vitamin D (04/25/2023) is low at 28.86  - She is taking combination calcium/vitamin D. - PLAN: Continue combination calcium/vitamin D with addition of vitamin D 1000 units daily. - Continue  weightbearing exercises for bone loss associated with breast cancer therapy. - Next DEXA scan due November 2026.  If any worsening, will consider reintroducing bisphosphonates/Prolia.  5.  Other history: - She works at [employer]  She quit smoking in 2009 after smoking 1 PPD x 20 years. - Paternal aunts x 3 had breast cancer.  Maternal aunt with breast cancer.  [facility] germline mutation testing was negative.  PLAN SUMMARY: >> Labs in 6 months = CBC/D, CMP, vitamin D, ferritin, iron/TIBC >> OFFICE visit in 6 months (1 week after labs)  ** FUTURE TESTS: - Mammogram due September 2025 - CT chest for lung nodule follow-up due November 2025 -  Bone density scan due November 2026    REVIEW OF SYSTEMS:   Review of Systems  Constitutional:  Positive for fatigue (mild). Negative for appetite change, chills, diaphoresis, fever and unexpected weight change.  HENT:   Negative for lump/mass and nosebleeds.   Eyes:  Negative for eye problems.  Respiratory:  Negative for cough, hemoptysis and shortness of breath.   Cardiovascular:  Negative for chest pain (Intermittent, none today), leg swelling and palpitations.  Gastrointestinal:  Negative for abdominal pain, blood in stool, constipation, diarrhea, nausea and vomiting.  Genitourinary:  Positive for bladder incontinence. Negative for hematuria.   Skin: Negative.   Neurological:  Positive for numbness (Neuropathy). Negative for dizziness, headaches and light-headedness.  Hematological:  Does not bruise/bleed easily.  Psychiatric/Behavioral:  Negative for depression. The patient is not nervous/anxious.     PHYSICAL EXAM:   Performance status (ECOG): 1 - Symptomatic but completely ambulatory  Vitals:   05/17/23 1128  BP: (!) 129/59  Pulse: 72  Resp: 16  Temp: 98.8 F (37.1 Erica Giles)  SpO2: 99%   Wt Readings from Last 3 Encounters:  05/17/23 232 lb (105.2 kg)  04/28/23 230 lb 12.8 oz (104.7 kg)  12/28/22 237 lb 3.2 oz (107.6 kg)   Physical Exam Constitutional:      Appearance: Normal appearance. She is obese.  HENT:     Head: Normocephalic and atraumatic.     Mouth/Throat:     Mouth: Mucous membranes are moist.  Eyes:     Extraocular Movements: Extraocular movements intact.     Pupils: Pupils are equal, round, and reactive to light.  Cardiovascular:     Rate and Rhythm: Normal rate and regular rhythm.     Pulses: Normal pulses.     Heart sounds: Normal heart sounds.  Pulmonary:     Effort: Pulmonary effort is normal.     Breath sounds: Normal breath sounds.  Chest:  Breasts:    Right: Normal.     Left: No swelling, inverted nipple, mass, nipple discharge, skin change or  tenderness.       Comments: Mild edema and tenderness of left lateral breast, near lumpectomy scar.  No discrete nodules or masses in either breast. Abdominal:     General: Bowel sounds are normal.     Palpations: Abdomen is soft.     Tenderness: There is no abdominal tenderness.  Musculoskeletal:        General: No swelling.     Right lower leg: Edema present.     Left lower leg: Edema present.     Comments: 1+ edema of left calf/pretibial region. 1+ edema of right foot.   Lymphadenopathy:     Cervical: No cervical adenopathy.     Upper Body:     Right upper body: No supraclavicular, axillary or pectoral adenopathy.  Left upper body: No supraclavicular, axillary or pectoral adenopathy.  Skin:    General: Skin is warm and dry.  Neurological:     General: No focal deficit present.     Mental Status: She is alert and oriented to person, place, and time.  Psychiatric:        Mood and Affect: Mood normal.        Behavior: Behavior normal.     PAST MEDICAL/SURGICAL HISTORY:  Past Medical History:  Diagnosis Date   Anxiety    Arthritis    Breast cancer (HCC)    left   Cataract    BEGINNING STAGES   CTS (carpal tunnel syndrome)    Depression    Diabetes mellitus    ORAL MED   Family history of breast cancer    GERD (gastroesophageal reflux disease)    Helicobacter pylori gastritis    Hiatal hernia    HNP (herniated nucleus pulposus), lumbar    PT TRYING TO AVOID LUMBAR SURGERY--STATES PAIN IN HER BACK AND SOMETIMES DOWN ONE OR THE OTHER LEG   Hyperlipidemia    Hypertension    Osteopenia    Personal history of radiation therapy    Plantar fasciitis    BILATERAL   Schatzki's ring    Shortness of breath    WITH EXERTION-PT RELATES TO HER WEIGHT   Sleep apnea    MODERATE- PER SLEEP STUDY 06/2010--PT USES CPAP-SETTING IS 11   Past Surgical History:  Procedure Laterality Date   ABDOMINAL HYSTERECTOMY     BREAST LUMPECTOMY     BREAST LUMPECTOMY WITH RADIOACTIVE  SEED AND SENTINEL LYMPH NODE BIOPSY Left 03/27/2019   Procedure: LEFT BREAST LUMPECTOMY WITH RADIOACTIVE SEED AND LEFT AXILLARY DEEP SENTINEL LYMPH NODE BIOPSY INJECT BLUE DYE;  Surgeon: Claud Kelp, MD;  Location: [facility] OR;  Service: General;  Laterality: Left;   CARPAL TUNNEL RELEASE     BILATERAL   CHOLECYSTECTOMY     CYST REMOVED     LEFT THUMB AND RT MIDDLE FINGER   TONSILLECTOMY     TRIGGER FINGER REPAIR     UPPER GASTROINTESTINAL ENDOSCOPY      SOCIAL HISTORY:  Social History   Socioeconomic History   Marital status: Married    Spouse name: Erica Erica Giles   Number of children: 2   Years of education: GED   Highest education level: Not on file  Occupational History   Occupation: Radiographer, therapeutic: [employer]  Tobacco Use   Smoking status: Former    Current packs/day: 0.00    Average packs/day: 1 pack/day for 25.0 years (25.0 ttl pk-yrs)    Types: Cigarettes    Start date: 03/06/1983    Quit date: 03/05/2008    Years since quitting: 15.2    Passive exposure: Current (HUSBAND SMOKES)   Smokeless tobacco: Never   Tobacco comments:    03/2008  Vaping Use   Vaping status: Never Used  Substance and Sexual Activity   Alcohol use: No   Drug use: No   Sexual activity: Not Currently    Birth control/protection: Post-menopausal    Comment: hysterectomy  Other Topics Concern   Not on file  Social History Narrative   Lives at home with her husband.   Right-handed.   Caffeine use: 20 ounces per day.   Social Determinants of Health   Financial Resource Strain: Low Risk  (05/01/2019)   Received from [facility], [facility]   Overall Financial Resource Strain (  CARDIA)    Difficulty of Paying Living Expenses: Not very hard  Recent Concern: Financial Resource Strain - Medium Risk (03/06/2019)   Overall Financial Resource Strain (CARDIA)    Difficulty of Paying Living Expenses: Somewhat hard  Food Insecurity: Unknown (05/01/2019)   Received from [facility], [facility]   Hunger Vital Sign    Worried About Running Out of Food in the Last Year: Patient declined    Ran Out of Food in the Last Year: Patient declined  Transportation Needs: No Transportation Needs (05/01/2019)   Received from [facility], [facility]   PRAPARE - Transportation    Lack of Transportation (Medical): No    Lack of Transportation (Non-Medical): No  Physical Activity: Unknown (05/01/2019)   Received from [facility], [facility]   Exercise Vital Sign    Days of Exercise per Week: Patient declined    Minutes of Exercise per Session: Patient declined  Recent Concern: Physical Activity - Inactive (03/06/2019)   Exercise Vital Sign    Days of Exercise per Week: 0 days    Minutes of Exercise per Session: 0 min  Stress: No Stress Concern Present (05/01/2019)   Received from [facility], [facility] of Occupational Health - Occupational Stress Questionnaire    Feeling of Stress : Only a little  Recent Concern: Stress - Stress Concern Present (03/06/2019)   [employer] of Occupational Health - Occupational Stress Questionnaire    Feeling of Stress : To some extent  Social Connections: Unknown (10/16/2021)   Received from [facility], [facility]   Social Network    Social Network: Not on file  Intimate Partner Violence: Unknown (09/08/2021)   Received from [employer], [facility]   HITS    Physically Hurt: Not on file    Insult or Talk Down To: Not on file    Threaten Physical Harm: Not on file    Scream or Curse: Not on file    FAMILY HISTORY:  Family History  Problem Relation Age of Onset   Diabetes Mother    Kidney disease Mother    Dementia Mother    Diabetes Father    Kidney Stones Sister    Other Sister 72       murdered   Other Sister 7       murdered   Diabetes Brother    Kidney Stones Brother    Other Brother        infant death   Kidney cancer Maternal Aunt 38   Breast  cancer Maternal Aunt        dx over 13   Breast cancer Paternal Aunt 65   Breast cancer Paternal Aunt 62   Breast cancer Paternal Aunt        dx over 73   Other Daughter        infanct death   Scoliosis Son    Colon cancer Neg Hx    Colon polyps Neg Hx    Esophageal cancer Neg Hx    Ulcerative colitis Neg Hx    Stomach cancer Neg Hx    Rectal cancer Neg Hx     CURRENT MEDICATIONS:  Current Outpatient Medications  Medication Sig Dispense Refill   anastrozole (ARIMIDEX) 1 MG tablet TAKE ONE TABLET BY MOUTH ONCE DAILY 90 tablet 1   atorvastatin (LIPITOR) 80 MG tablet Take 80 mg by mouth daily.     Blood Glucose Calibration (  CONTOUR NEXT CONTROL) Normal SOLN      Blood Glucose Monitoring Suppl (CONTOUR NEXT MONITOR) w/Device KIT      CELEBREX 100 MG capsule Take 1 capsule twice a day by oral route as needed for 28 days.     cholecalciferol (VITAMIN D3) 25 MCG (1000 UT) tablet Take 2,000-4,000 Units by mouth See admin instructions. 2000 mcg daily, except once a week, take 4000 mcg daily     Continuous Glucose Receiver (DEXCOM G7 RECEIVER) DEVI USE TO MONITOR BLOOD GLUCOSE     Continuous Glucose Sensor (DEXCOM G7 SENSOR) MISC SMARTSIG:1 Topical     cyanocobalamin (,VITAMIN B-12,) 1000 MCG/ML injection Inject 1,000 mcg into the muscle every 30 (thirty) days.     glucose blood (CONTOUR NEXT TEST) test strip      HUMULIN R U-500 KWIKPEN 500 UNIT/ML KwikPen Inject into the skin.     hydrOXYzine (ATARAX) 10 MG tablet hydroxyzine HCl 10 mg tablet  TAKE ONE TABLET BY MOUTH EVERY 8 HOURS AS NEEDED FOR ANXIETY     ibuprofen (ADVIL) 800 MG tablet as needed.      lisinopril (PRINIVIL,ZESTRIL) 20 MG tablet Take 20 mg by mouth at bedtime.     LYRICA 75 MG capsule Take 1 capsule 3 times a day by oral route for 30 days.     pantoprazole (PROTONIX) 40 MG tablet Take 1 tablet (40 mg total) by mouth 2 (two) times daily. (Patient taking differently: Take 40 mg by mouth daily.) 60 tablet 11    SYRINGE-NEEDLE, DISP, 3 ML (B-D 3CC LUER-LOK SYR 25GX5/8") 25G X 5/8" 3 ML MISC BD Luer-Lok Syringe 3 mL 25 x 5/8"  USE TO INJECT VITAMIN B12 ONCE A MONTH.     tirzepatide (MOUNJARO) 2.5 MG/0.5ML Pen Inject 2.5 mg into the skin once a week. 2 mL 0   trolamine salicylate (ASPERCREME) 10 % cream Apply 1 Application topically as needed for muscle pain.     Vibegron 75 MG TABS Take 1 tablet (75 mg total) by mouth daily. 30 tablet 5   Current Facility-Administered Medications  Medication Dose Route Frequency Provider Last Rate Last Admin   0.9 %  sodium chloride infusion  500 mL Intravenous Continuous Pyrtle, Carie Caddy, MD        ALLERGIES:  Allergies  Allergen Reactions   Celecoxib Other (See Comments)   Exenatide     Other reaction(s): knots at injection sites for months   Percocet [Oxycodone-Acetaminophen] Itching    PT CAN TAKE WITH BENADRYL   Acetaminophen Rash    Other reaction(s): Other (See Comments)   Hydrocodone Itching    PT WOULD TAKE WITH BENADRYL PT WOULD TAKE WITH BENADRYL   Oxycodone Hcl Rash   Oxycodone-Acetaminophen Itching    PT CAN TAKE WITH BENADRYL    LABORATORY DATA:  I have reviewed the labs as listed.     Latest Ref Rng & Units 04/25/2023    8:49 AM 10/06/2022    9:32 AM 04/12/2022    8:40 AM  CBC  WBC 4.0 - 10.5 K/uL 5.2  6.5  5.5   Hemoglobin 12.0 - 15.0 g/dL 46.9  62.9  52.8   Hematocrit 36.0 - 46.0 % 34.5  36.0  33.0   Platelets 150 - 400 K/uL 175  218  195       Latest Ref Rng & Units 04/25/2023    8:49 AM 02/15/2023    4:41 PM 12/28/2022   12:07 PM  CMP  Glucose 70 - 99  mg/dL 244   010   BUN 8 - 23 mg/dL 12   13   Creatinine 2.72 - 1.00 mg/dL 5.36  6.44  0.34   Sodium 135 - 145 mmol/L 137   137   Potassium 3.5 - 5.1 mmol/L 3.8   4.7   Chloride 98 - 111 mmol/L 102   101   CO2 22 - 32 mmol/L 26   25   Calcium 8.9 - 10.3 mg/dL 9.3   9.4   Total Protein 6.5 - 8.1 g/dL 6.3     Total Bilirubin <1.2 mg/dL 0.3     Alkaline Phos 38 - 126 U/L 95      AST 15 - 41 U/L 20     ALT 0 - 44 U/L 25       DIAGNOSTIC IMAGING:  I have independently reviewed the scans and discussed with the patient. CT CHEST WO CONTRAST  Result Date: 05/12/2023 CLINICAL DATA:  Multiple pulmonary nodules EXAM: CT CHEST WITHOUT CONTRAST TECHNIQUE: Multidetector CT imaging of the chest was performed following the standard protocol without IV contrast. RADIATION DOSE REDUCTION: This exam was performed according to the departmental dose-optimization program which includes automated exposure control, adjustment of the mA and/or kV according to patient size and/or use of iterative reconstruction technique. COMPARISON:  02/15/2023, 04/12/2022 FINDINGS: Cardiovascular: Unenhanced imaging of the heart is unremarkable without pericardial effusion. Normal caliber of the thoracic aorta. Stable atherosclerosis of the aorta and coronary vasculature. Evaluation of the vascular lumen is limited without IV contrast. Mediastinum/Nodes: No enlarged mediastinal or axillary lymph nodes. Thyroid gland, trachea, and esophagus demonstrate no significant findings. Small hiatal hernia. Lungs/Pleura: Lobular nodule within the posterior aspect of the right upper lobe measures up to 1.5 x 0.8 cm on today's exam, reference image 64/3, not appreciably changed since prior studies. 2-3 mm nodules within the right lower lobe, reference image 90/3 and image 102/3, unchanged since prior exam. Stable 2-3 mm left lower lobe pulmonary nodules, reference image 89/3. No new pulmonary nodules or masses. No acute airspace disease, effusion, or pneumothorax. Subpleural scarring is seen within the left upper lobe, likely post radiation change in a patient with a history of left breast cancer. Central airways are patent. Upper Abdomen: No acute abnormality. Musculoskeletal: Postsurgical changes in the left breast. No acute or destructive bony abnormalities. Reconstructed images demonstrate no additional findings. IMPRESSION:  1. No acute intrathoracic process. 2. Stable bilateral pulmonary nodules, most pronounced within the right upper lobe. 3. Stable small hiatal hernia. 4. Postsurgical changes within the left breast, with likely underlying post radiation scarring within the anterior left upper lobe. These are stable findings. Electronically Signed   By: Sharlet Salina M.D.   On: 05/12/2023 19:06   DG Bone Density  Result Date: 04/25/2023 EXAM: DUAL X-RAY ABSORPTIOMETRY (DXA) FOR BONE MINERAL DENSITY IMPRESSION: Your patient Erica Erica Giles completed a BMD test on 04/25/2023 using the [employer] DXA System (software version: 14.10) manufactured by [employer]. The following summarizes the results of our evaluation. Technologist: [facility] PATIENT BIOGRAPHICAL: Name: Erica Erica Giles, Erica Erica Giles Patient ID: [id number] Birth Date: 09/04/1955 Height: 65.0 in. Gender: Female Exam Date: 04/25/2023 Weight: 237.2 lbs. Indications: Caucasian, Diabetic-insulin dependent, Family Hist. (Parent hip fracture), Height Loss, History of Fracture (Adult), Hx Breast Ca, Parent Hip Fracture, Partial Hysterectomy, Post Menopausal, Secondary Osteoporosis Fractures: Humerus, Wrist Treatments: Anastrozole, Calcium, Vitamin D DENSITOMETRY RESULTS: Site          Region     Measured Date  Measured Age WHO Classification Young Adult T-score BMD         %Change vs. Previous Significant Change (*) DualFemur Neck Left 04/25/2023 66.2 Osteopenia -1.4 0.839 g/cm2 -2.3% - DualFemur Neck Left 5/20/2022 64.2 Osteopenia -1.3 0.859 g/cm2 -3.7% - DualFemur Neck Left 5/19/2019 62.2 Normal -1.0 0.892 g/cm2 - - DualFemur Total Mean 04/25/2023 66.2 Normal -0.6 0.926 g/cm2 3.0% Yes DualFemur Total Mean 04/20/2021 64.2 Normal -0.9 0.899 g/cm2 -4.4% Yes DualFemur Total Mean 04/19/2019 62.2 Normal -0.5 0.940 g/cm2 - - Right Forearm Radius 33% 04/25/2023 66.2 Osteopenia -1.4 0.613 g/cm2 -4.0% - Right Forearm Radius 33% 04/20/2021 64.2 Normal -1.0 0.639 g/cm2 - - ASSESSMENT: The  BMD measured at Femur Neck Left is 0.839 g/cm2 with a T-score of -1.4. This patient is considered osteopenic according to World Health Organization [facility]) criteria. The scan quality is good. Lumbar spine was excluded due to advanced degenerative changes. Compared with the prior study on 04/20/2021, the BMD of the total mean shows a statistically significant increase. World Science writer [facility]) criteria for post-menopausal, Caucasian Women: Normal:       T-score at or above -1 SD Osteopenia:   T-score between -1 and -2.5 SD Osteoporosis: T-score at or below -2.5 SD RECOMMENDATIONS: 1. All patients should optimize calcium and vitamin D intake. 2. Consider FDA-approved medical therapies in postmenopausal women and med aged 61 years and older, based on the following: a. A hip or vertebral (clinical or morphometric) fracture b. T-score< -2.5 at the femoral neck or spine after appropriate evaluation to exclude secondary causes Erica Giles. Low bone mass (T-score between -1.0 and -2.5 at the femoral neck or spine) and a 10-year probability of a hip fracture > 3% or a 10-year probability of a major osteoporosis-related fracture > 20% based on the [place]-adapted WHO algorithm d. Clinician judgment and/or patient preferences may indicate treatment for people with 10-year fracture probabilities above or below these levels FOLLOW-UP: Patients with diagnosis of osteoporsis or at high risk for fracture should have regular bone mineral density tests. For patients eligible for Medicare, routine testing is allowed once every 2 years. The testing frequency can be increased to one year for patients who have rapidly progressing disease, those who are receiving or discontinuing medical therapy to restore bone mass, or have additional risk factors. I have reviewed this report, and agree with the above findings. [facility] Your patient Luisana Erica Giles Briski completed a FRAX assessment on 04/25/2023 using the [employer] DXA System  (analysis version: 14.10) manufactured by [employer]. The following summarizes the results of our evaluation. PATIENT BIOGRAPHICAL: Name: Khloee, Erica Erica Giles Patient ID: [id number] Birth Date: 01/02/1956 Height:    65.0 in. Gender:     Female    [age]       Weight:    237.2 lbs. Ethnicity:  White                            Exam Date: 04/25/2023 FRAX* RESULTS:  (version: 3.5) 10-year Probability of Fracture1 Major Osteoporotic Fracture2 Hip Fracture 23.7% 1.6% Population: [place] (Caucasian) Risk Factors: Family Hist. (Parent hip fracture), History of Fracture (Adult), Secondary Osteoporosis Based on DualFemur (Left) Neck BMD 1 -The 10-year probability of fracture may be lower than reported if the patient has received treatment. 2 -Major Osteoporotic Fracture: Clinical Spine, Forearm, Hip or Shoulder *FRAX is a Armed forces logistics/support/administrative officer of the [employer] for Metabolic  Bone Disease, a World Science writer (WHO) Collaborating Centre. ASSESSMENT: The probability of a major osteoporotic fracture is 23.7% within the next ten years. The probability of a hip fracture is 1.6% within the next ten years. Electronically Signed   By: Romona Curls M.D.   On: 04/25/2023 09:56     WRAP UP:  All questions were answered. The patient knows to call the clinic with any problems, questions or concerns.  Medical decision making: Moderate  Time spent on visit: I spent 20 minutes counseling the patient face to face. The total time spent in the appointment was 30 minutes and more than 50% was on counseling.  Carnella Guadalajara, PA-Erica Giles  05/17/23 4:02 PM

## 2023-05-17 ENCOUNTER — Inpatient Hospital Stay: Payer: Medicare Other | Attending: Physician Assistant | Admitting: Physician Assistant

## 2023-05-17 VITALS — BP 129/59 | HR 72 | Temp 98.8°F | Resp 16 | Wt 232.0 lb

## 2023-05-17 DIAGNOSIS — Z79899 Other long term (current) drug therapy: Secondary | ICD-10-CM | POA: Insufficient documentation

## 2023-05-17 DIAGNOSIS — D500 Iron deficiency anemia secondary to blood loss (chronic): Secondary | ICD-10-CM | POA: Diagnosis not present

## 2023-05-17 DIAGNOSIS — Z923 Personal history of irradiation: Secondary | ICD-10-CM | POA: Diagnosis not present

## 2023-05-17 DIAGNOSIS — D509 Iron deficiency anemia, unspecified: Secondary | ICD-10-CM | POA: Insufficient documentation

## 2023-05-17 DIAGNOSIS — Z170 Estrogen receptor positive status [ER+]: Secondary | ICD-10-CM | POA: Insufficient documentation

## 2023-05-17 DIAGNOSIS — M8580 Other specified disorders of bone density and structure, unspecified site: Secondary | ICD-10-CM | POA: Diagnosis not present

## 2023-05-17 DIAGNOSIS — Z87891 Personal history of nicotine dependence: Secondary | ICD-10-CM | POA: Insufficient documentation

## 2023-05-17 DIAGNOSIS — M1990 Unspecified osteoarthritis, unspecified site: Secondary | ICD-10-CM | POA: Insufficient documentation

## 2023-05-17 DIAGNOSIS — R918 Other nonspecific abnormal finding of lung field: Secondary | ICD-10-CM

## 2023-05-17 DIAGNOSIS — M8589 Other specified disorders of bone density and structure, multiple sites: Secondary | ICD-10-CM

## 2023-05-17 DIAGNOSIS — G629 Polyneuropathy, unspecified: Secondary | ICD-10-CM | POA: Insufficient documentation

## 2023-05-17 DIAGNOSIS — C50412 Malignant neoplasm of upper-outer quadrant of left female breast: Secondary | ICD-10-CM | POA: Insufficient documentation

## 2023-05-17 DIAGNOSIS — E559 Vitamin D deficiency, unspecified: Secondary | ICD-10-CM

## 2023-05-17 DIAGNOSIS — Z79811 Long term (current) use of aromatase inhibitors: Secondary | ICD-10-CM | POA: Insufficient documentation

## 2023-05-17 DIAGNOSIS — Z803 Family history of malignant neoplasm of breast: Secondary | ICD-10-CM | POA: Insufficient documentation

## 2023-05-17 NOTE — Patient Instructions (Signed)
[facility] **VISIT SUMMARY & IMPORTANT INSTRUCTIONS **   You were seen today by Rojelio Brenner PA-C for your follow-up visit.    HISTORY OF BREAST CANCER There were no signs of recurrent breast cancer at your appointment today. Continue to take anastrozole daily for a total of 5 years of treatment (through November 2025) Your next mammogram will be due in September 2025.  LUNG NODULES Your most recent CT scan showed stable lung nodules without evidence of malignancy.  We will check repeat CT chest in November 2025.  IRON DEFICIENCY Your blood and iron levels have improved.  Continue taking iron tablet daily.  OSTEOPENIA Your most recent bone density scan continues to show mild osteopenia that does not require treatment at this time. Your next bone density scan will be due in November 2026. Your vitamin D level is low.  Continue taking combination calcium/vitamin D tablet once daily, but in addition to this START taking over-the-counter vitamin D 1000 units daily.  FOLLOW-UP APPOINTMENT: Follow-up visit in 6 months  ** Thank you for trusting me with your healthcare!  I strive to provide all of my patients with quality care at each visit.  If you receive a survey for this visit, I would be so grateful to you for taking the time to provide feedback.  Thank you in advance!  ~ Venezia Sargeant                   Dr. Doreatha Massed   &   Rojelio Brenner, PA-C   - - - - - - - - - - - - - - - - - -    Thank you for choosing [facility] to provide your oncology and hematology care.  To afford each patient quality time with our provider, please arrive at least 15 minutes before your scheduled appointment time.   If you have a lab appointment with the Cancer Center please come in thru the Main Entrance and check in at the main information desk.  You need to re-schedule your appointment should you arrive 10 or more  minutes late.  We strive to give you quality time with our providers, and arriving late affects you and other patients whose appointments are after yours.  Also, if you no show three or more times for appointments you may be dismissed from the clinic at the providers discretion.     Again, thank you for choosing [facility].  Our hope is that these requests will decrease the amount of time that you wait before being seen by our physicians.       _____________________________________________________________  Should you have questions after your visit to [facility], please contact our office at [phone] and follow the prompts.  Our office hours are 8:00 a.m. and 4:30 p.m. Monday - Friday.  Please note that voicemails left after 4:00 p.m. may not be returned until the following business day.  We are closed weekends and major holidays.  You do have access to a nurse 24-7, just call the main number to the clinic [phone] and do not press any options, hold on the line and a nurse will answer the phone.    For prescription refill requests, have your pharmacy contact our office and allow 72 hours.

## 2023-05-24 ENCOUNTER — Ambulatory Visit: Payer: Medicare Other | Admitting: Physician Assistant

## 2023-06-27 ENCOUNTER — Ambulatory Visit: Payer: Medicare Other | Admitting: Internal Medicine

## 2023-07-04 ENCOUNTER — Ambulatory Visit: Payer: Medicare Other | Attending: Internal Medicine | Admitting: Internal Medicine

## 2023-07-04 ENCOUNTER — Encounter: Payer: Self-pay | Admitting: Internal Medicine

## 2023-07-04 VITALS — BP 138/72 | HR 81 | Ht 65.5 in | Wt 236.8 lb

## 2023-07-04 DIAGNOSIS — R079 Chest pain, unspecified: Secondary | ICD-10-CM | POA: Insufficient documentation

## 2023-07-04 DIAGNOSIS — I10 Essential (primary) hypertension: Secondary | ICD-10-CM | POA: Insufficient documentation

## 2023-07-04 DIAGNOSIS — Z136 Encounter for screening for cardiovascular disorders: Secondary | ICD-10-CM | POA: Diagnosis present

## 2023-07-04 NOTE — Progress Notes (Signed)
Cardiology Office Note  Date: 07/04/2023   ID: Erica Giles, DOB [birth year], MRN [id number]  PCP:  [facility], [facility]  Cardiologist:  None Electrophysiologist:  None   History of Present Illness: Erica Giles is a 68 y.o. female known to have HTN, DM 2, HLD, breast cancer is here for follow-up visit.  Patient was referred to cardiology clinic for chest pain.  She reported having chest pains during stress and not with physical activity.  She is under a lot of stress, she is takes care of her disabled son, her husband is ill recently, her 52 year old daughter is currently dealing with aneurysm and leakiness issues in her heart.  She underwent a CT cardiac in October 2024 that showed coronary calcium score of 422, severe TPV and mild nonobstructive CAD in the ostial LAD and mid RCA.  Echocardiogram in July 2024 showed normal LVEF, normal RV function, no valvular heart disease.  She does not report any more chest pains.  She reported she had chest pains previously with stress and not with physical activity.  She has neuropathy in her hands and legs which is limiting her ambulation.  She does have mild SOB and she thinks this could be from deconditioning.  No leg swelling.  No dizziness, syncope, palpitations.  Past Medical History:  Diagnosis Date   Anxiety    Arthritis    Breast cancer (HCC)    left   Cataract    BEGINNING STAGES   CTS (carpal tunnel syndrome)    Depression    Diabetes mellitus    ORAL MED   Family history of breast cancer    GERD (gastroesophageal reflux disease)    Helicobacter pylori gastritis    Hiatal hernia    HNP (herniated nucleus pulposus), lumbar    PT TRYING TO AVOID LUMBAR SURGERY--STATES PAIN IN HER BACK AND SOMETIMES DOWN ONE OR THE OTHER LEG   Hyperlipidemia    Hypertension    Osteopenia    Personal history of radiation therapy    Plantar fasciitis    BILATERAL   Schatzki's ring    Shortness of breath    WITH EXERTION-PT RELATES TO HER  WEIGHT   Sleep apnea    MODERATE- PER SLEEP STUDY 06/2010--PT USES CPAP-SETTING IS 11    Past Surgical History:  Procedure Laterality Date   ABDOMINAL HYSTERECTOMY     BREAST LUMPECTOMY     BREAST LUMPECTOMY WITH RADIOACTIVE SEED AND SENTINEL LYMPH NODE BIOPSY Left 03/27/2019   Procedure: LEFT BREAST LUMPECTOMY WITH RADIOACTIVE SEED AND LEFT AXILLARY DEEP SENTINEL LYMPH NODE BIOPSY INJECT BLUE DYE;  Surgeon: Claud Kelp, MD;  Location: [facility] OR;  Service: General;  Laterality: Left;   CARPAL TUNNEL RELEASE     BILATERAL   CHOLECYSTECTOMY     CYST REMOVED     LEFT THUMB AND RT MIDDLE FINGER   TONSILLECTOMY     TRIGGER FINGER REPAIR     UPPER GASTROINTESTINAL ENDOSCOPY      Current Outpatient Medications  Medication Sig Dispense Refill   anastrozole (ARIMIDEX) 1 MG tablet TAKE ONE TABLET BY MOUTH ONCE DAILY 90 tablet 1   aspirin EC 81 MG tablet Take 81 mg by mouth daily. Swallow whole.     atorvastatin (LIPITOR) 80 MG tablet Take 80 mg by mouth daily.     CELEBREX 100 MG capsule Take 1 capsule twice a day by oral route as needed for 28 days.     cholecalciferol (VITAMIN D3) 25 MCG (1000  UT) tablet Take 2,000-4,000 Units by mouth See admin instructions. 2000 mcg daily, except once a week, take 4000 mcg daily     Continuous Glucose Receiver (DEXCOM G7 RECEIVER) DEVI USE TO MONITOR BLOOD GLUCOSE     HUMULIN R U-500 KWIKPEN 500 UNIT/ML KwikPen Inject into the skin.     hydrOXYzine (ATARAX) 10 MG tablet hydroxyzine HCl 10 mg tablet  TAKE ONE TABLET BY MOUTH EVERY 8 HOURS AS NEEDED FOR ANXIETY     ibuprofen (ADVIL) 800 MG tablet as needed.      lisinopril (PRINIVIL,ZESTRIL) 20 MG tablet Take 20 mg by mouth at bedtime.     LYRICA 75 MG capsule Take 1 capsule 3 times a day by oral route for 30 days.     pantoprazole (PROTONIX) 40 MG tablet Take 1 tablet (40 mg total) by mouth 2 (two) times daily. (Patient taking differently: Take 40 mg by mouth daily.) 60 tablet 11   trolamine salicylate  (ASPERCREME) 10 % cream Apply 1 Application topically as needed for muscle pain.     Vibegron 75 MG TABS Take 1 tablet (75 mg total) by mouth daily. 30 tablet 5   Blood Glucose Calibration (CONTOUR NEXT CONTROL) Normal SOLN      Blood Glucose Monitoring Suppl (CONTOUR NEXT MONITOR) w/Device KIT      Continuous Glucose Sensor (DEXCOM G7 SENSOR) MISC SMARTSIG:1 Topical     cyanocobalamin (,VITAMIN B-12,) 1000 MCG/ML injection Inject 1,000 mcg into the muscle every 30 (thirty) days. (Patient not taking: Reported on 07/04/2023)     glucose blood (CONTOUR NEXT TEST) test strip      SYRINGE-NEEDLE, DISP, 3 ML (B-D 3CC LUER-LOK SYR 25GX5/8") 25G X 5/8" 3 ML MISC BD Luer-Lok Syringe 3 mL 25 x 5/8"  USE TO INJECT VITAMIN B12 ONCE A MONTH.     Current Facility-Administered Medications  Medication Dose Route Frequency Provider Last Rate Last Admin   0.9 %  sodium chloride infusion  500 mL Intravenous Continuous Pyrtle, Carie Caddy, MD       Allergies:  Celecoxib, Exenatide, Percocet [oxycodone-acetaminophen], Acetaminophen, Hydrocodone, Oxycodone hcl, and Oxycodone-acetaminophen   Social History: The patient  reports that she quit smoking about 15 years ago. Her smoking use included cigarettes. She started smoking about 40 years ago. She has a 25 pack-year smoking history. She has been exposed to tobacco smoke. She has never used smokeless tobacco. She reports that she does not drink alcohol and does not use drugs.   Family History: The patient's family history includes Breast cancer in her maternal aunt and paternal aunt; Breast cancer (age of onset: 13) in her paternal aunt; Breast cancer (age of onset: 47) in her paternal aunt; Dementia in her mother; Diabetes in her brother, father, and mother; Kidney Stones in her brother and sister; Kidney cancer (age of onset: 28) in her maternal aunt; Kidney disease in her mother; Other in her brother and daughter; Other (age of onset: 86) in her sister; Other (age of onset:  40) in her sister; Scoliosis in her son.   ROS:  Please see the history of present illness. Otherwise, complete review of systems is positive for none.  All other systems are reviewed and negative.   Physical Exam: VS:  BP 138/72   Pulse 81   Ht 5' 5.5" (1.664 m)   Wt 236 lb 12.8 oz (107.4 kg)   LMP  (LMP Unknown)   SpO2 97%   BMI 38.81 kg/m , BMI Body mass index is 38.81  kg/m.  Wt Readings from Last 3 Encounters:  07/04/23 236 lb 12.8 oz (107.4 kg)  05/17/23 232 lb (105.2 kg)  04/28/23 230 lb 12.8 oz (104.7 kg)    General: Patient appears comfortable at rest. HEENT: Conjunctiva and lids normal, oropharynx clear with moist mucosa. Neck: Supple, no elevated JVP or carotid bruits, no thyromegaly. Lungs: Clear to auscultation, nonlabored breathing at rest. Cardiac: Regular rate and rhythm, no S3 or significant systolic murmur, no pericardial rub. Abdomen: Soft, nontender, no hepatomegaly, bowel sounds present, no guarding or rebound. Extremities: No pitting edema, distal pulses 2+. Skin: Warm and dry. Musculoskeletal: No kyphosis. Neuropsychiatric: Alert and oriented x3, affect grossly appropriate.  Recent Labwork: 04/25/2023: ALT 25; AST 20; BUN 12; Creatinine, Ser 0.87; Hemoglobin 11.5; Platelets 175; Potassium 3.8; Sodium 137  No results found for: "CHOL", "TRIG", "HDL", "CHOLHDL", "VLDL", "LDLCALC", "LDLDIRECT"   Assessment and Plan:  Chest pain: Chest pains occur during stress and not with physical activity.  No interval chest pains.  She takes care of her family members (son is disabled, husband is ill recently and her 65 year old daughter has heart issues, aneurysm leakiness).  CT cardiac in October 2024 showed coronary calcium score of 422 (94% for age and sex matched control), total plaque volume is severe, mild stenosis in the ostial LAD and mid RCA is noted.  Does not take baby aspirin, start aspirin 81 mg once daily and continue atorvastatin 80 mg nightly.  Heart healthy  diet and exercise recommended.  HTN, controlled: Continue lisinopril 20 mg once daily.  Follow-up with PCP.  HLD, unknown values: Continue atorvastatin 80 mg at bedtime.  Goal LDL less than 100.    Medication Adjustments/Labs and Tests Ordered: Current medicines are reviewed at length with the patient today.  Concerns regarding medicines are outlined above.    Disposition:  Follow up 1 year  Signed, Tavyn Kurka Verne Spurr, MD, 07/04/2023 12:50 PM    [facility] [address] [place], [place] [address]

## 2023-07-04 NOTE — Patient Instructions (Signed)
Medication Instructions:  Your physician recommends that you continue on your current medications as directed. Please refer to the Current Medication list given to you today.   Labwork: None today  Testing/Procedures: None today  Follow-Up: 1 year  Any Other Special Instructions Will Be Listed Below (If Applicable).  If you need a refill on your cardiac medications before your next appointment, please call your pharmacy.

## 2023-07-29 ENCOUNTER — Ambulatory Visit: Payer: Medicare Other | Admitting: Obstetrics and Gynecology

## 2023-08-11 ENCOUNTER — Other Ambulatory Visit: Payer: Self-pay

## 2023-08-11 ENCOUNTER — Ambulatory Visit: Payer: Medicare Other | Attending: Obstetrics and Gynecology

## 2023-08-11 DIAGNOSIS — N3281 Overactive bladder: Secondary | ICD-10-CM | POA: Diagnosis not present

## 2023-08-11 DIAGNOSIS — R293 Abnormal posture: Secondary | ICD-10-CM | POA: Diagnosis not present

## 2023-08-11 DIAGNOSIS — M5459 Other low back pain: Secondary | ICD-10-CM | POA: Insufficient documentation

## 2023-08-11 DIAGNOSIS — N393 Stress incontinence (female) (male): Secondary | ICD-10-CM | POA: Diagnosis not present

## 2023-08-11 DIAGNOSIS — R279 Unspecified lack of coordination: Secondary | ICD-10-CM | POA: Diagnosis not present

## 2023-08-11 DIAGNOSIS — R350 Frequency of micturition: Secondary | ICD-10-CM | POA: Diagnosis not present

## 2023-08-11 DIAGNOSIS — M6289 Other specified disorders of muscle: Secondary | ICD-10-CM | POA: Insufficient documentation

## 2023-08-11 DIAGNOSIS — M6281 Muscle weakness (generalized): Secondary | ICD-10-CM | POA: Insufficient documentation

## 2023-08-11 NOTE — Patient Instructions (Signed)
 Urge Incontinence  Ideal urination frequency is every 2-4 wakeful hours, which equates to 5-8 times within a 24-hour period.   Urge incontinence is leakage that occurs when the bladder muscle contracts, creating a sudden need to go before getting to the bathroom.   Going too often when your bladder isn't actually full can disrupt the body's automatic signals to store and hold urine longer, which will increase urgency/frequency.  In this case, the bladder "is running the show" and strategies can be learned to retrain this pattern.   One should be able to control the first urge to urinate, at around .  The bladder can hold up to a "grande latte," or . To help you gain control, practice the Urge Drill below when urgency strikes.  This drill will help retrain your bladder signals and allow you to store and hold urine longer.  The overall goal is to stretch out your time between voids to reach a more manageable voiding schedule.    Practice your "quick flicks" often throughout the day (each waking hour) even when you don't need feel the urge to go.  This will help strengthen your pelvic floor muscles, making them more effective in controlling leakage.  Urge Drill  When you feel an urge to go, follow these steps to regain control: Stop what you are doing and be still Take one deep breath, directing your air into your abdomen Think an affirming thought, such as "I've got this." Do 5 quick flicks of your pelvic floor Walk with control to the bathroom to void, or delay voiding  [facility] [address], [address] [place], [place] [address] Phone # [phone] Fax [phone]

## 2023-08-11 NOTE — Therapy (Signed)
 OUTPATIENT PHYSICAL THERAPY FEMALE PELVIC EVALUATION   Patient Name: Erica Giles MRN: [id number] DOB:May 12, 1956, 68 y.o., female Today's Date: 08/11/2023  END OF SESSION:  PT End of Session - 08/11/23 1206     Visit Number 1    Date for PT Re-Evaluation 11/03/23    Authorization Type Medicare Kx at 15    PT Start Time 1230    PT Stop Time 1310    PT Time Calculation (min) 40 min    Activity Tolerance Patient tolerated treatment well    Behavior During Therapy WFL for tasks assessed/performed             Past Medical History:  Diagnosis Date   Anxiety    Arthritis    Breast cancer (HCC)    left   Cataract    BEGINNING STAGES   CTS (carpal tunnel syndrome)    Depression    Diabetes mellitus    ORAL MED   Family history of breast cancer    GERD (gastroesophageal reflux disease)    Helicobacter pylori gastritis    Hiatal hernia    HNP (herniated nucleus pulposus), lumbar    PT TRYING TO AVOID LUMBAR SURGERY--STATES PAIN IN HER BACK AND SOMETIMES DOWN ONE OR THE OTHER LEG   Hyperlipidemia    Hypertension    Osteopenia    Personal history of radiation therapy    Plantar fasciitis    BILATERAL   Schatzki's ring    Shortness of breath    WITH EXERTION-PT RELATES TO HER WEIGHT   Sleep apnea    MODERATE- PER SLEEP STUDY 06/2010--PT USES CPAP-SETTING IS 11   Past Surgical History:  Procedure Laterality Date   ABDOMINAL HYSTERECTOMY     BREAST LUMPECTOMY     BREAST LUMPECTOMY WITH RADIOACTIVE SEED AND SENTINEL LYMPH NODE BIOPSY Left 03/27/2019   Procedure: LEFT BREAST LUMPECTOMY WITH RADIOACTIVE SEED AND LEFT AXILLARY DEEP SENTINEL LYMPH NODE BIOPSY INJECT BLUE DYE;  Surgeon: Claud Kelp, MD;  Location: [facility] OR;  Service: General;  Laterality: Left;   CARPAL TUNNEL RELEASE     BILATERAL   CHOLECYSTECTOMY     CYST REMOVED     LEFT THUMB AND RT MIDDLE FINGER   TONSILLECTOMY     TRIGGER FINGER REPAIR     UPPER GASTROINTESTINAL ENDOSCOPY     Patient Active  Problem List   Diagnosis Date Noted   Chest pain of uncertain etiology 07/04/2023   HTN (hypertension) 07/04/2023   Iron deficiency anemia due to chronic blood loss 10/14/2022   Multiple lung nodules on CT 09/16/2021   Neck pain 10/25/2019   DM type 2 with diabetic peripheral neuropathy (HCC) 10/25/2019   Genetic testing 07/12/2019   Family history of breast cancer    Breast cancer of upper-outer quadrant of left female breast (HCC) 03/06/2019   GERD (gastroesophageal reflux disease) 12/11/2013   Hemorrhoids 12/11/2013   DM2 (diabetes mellitus, type 2) (HCC) 12/11/2013   DDD (degenerative disc disease) 12/11/2013   HLD (hyperlipidemia) 12/11/2013   S/P cholecystectomy 12/11/2013    PCP: [facility]  REFERRING PROVIDER: Selmer Dominion, NP  REFERRING DIAG: R35.0 (ICD-10-CM) - Urinary frequency N32.81 (ICD-10-CM) - OAB (overactive bladder) N39.3 (ICD-10-CM) - SUI (stress urinary incontinence, female) M62.89 (ICD-10-CM) - Pelvic floor dysfunction in female  THERAPY DIAG:  Muscle weakness (generalized)  Unspecified lack of coordination  Abnormal posture  Other low back pain  Rationale for Evaluation and Treatment: Rehabilitation  ONSET DATE: 6 months  SUBJECTIVE:  SUBJECTIVE STATEMENT: Pt starting having issues with bladder around 6 months ago for unknown reason. She has difficulty getting out of car, up from chair, any movement without leaking. Pt is having MRI on 08/26/23 for low back due to pain.  Fluid intake: 2 cups of coffee in the morning, occasional soda, peach tea  PAIN:  Are you having pain? Yes NPRS scale: 2-3/10 in bil hips, 7/10 low back  Pain location:  bil hips and low back  Pain type: aching, burning, and sharp Pain description: constant   Aggravating  factors: walking, bending, squatting Relieving factors: medication, lying down  PRECAUTIONS: None  RED FLAGS: None   WEIGHT BEARING RESTRICTIONS: No  FALLS:  Has patient fallen in last 6 months? No  OCCUPATION: retired   ACTIVITY LEVEL : no exercise   PLOF: Independent  PATIENT GOALS: improve bladder control  PERTINENT HISTORY:  Hysterectomy 1986, bladder surgery 1986, twin delivery 1985, breast cancer dx 2019 with lumpectomy and radiation, cholecystectomy, osteopenia, hiatal hernia, diabetes mellitus  BOWEL MOVEMENT: Pain with bowel movement: No Type of bowel movement:Frequency every other day and Strain none as long as she takes stool softener Fully empty rectum: No, not always Leakage: Yes: if she has diarrhea Pads: Yes: see below Fiber supplement/laxative Yes - stool softeners   URINATION: Pain with urination: No Fully empty bladder: Yes:   Stream: Strong Urgency: Yes  Frequency: 6x/day, 2x/night Leakage: Urge to void, Walking to the bathroom, and sit>stand, getting out of car; leaks 1-4 times a day Pads: Yes: was using 4 pads a day, but now only using when traveling  INTERCOURSE:  Not currently sexually active  PREGNANCY: Vaginal deliveries 1 Tearing No Episiotomy No C-section deliveries 1 (twins) Currently pregnant No  PROLAPSE: None   OBJECTIVE:  Note: Objective measures were completed at Evaluation unless otherwise noted.  08/11/23:  PATIENT SURVEYS:   PFIQ-7: 48  COGNITION: Overall cognitive status: Within functional limits for tasks assessed     SENSATION: Light touch: Deficits bil LE - neuropathy  FUNCTIONAL TESTS:  Squat: large Rt weight shift, decreased descent Single leg stance:  Rt: pelvic drop  Lt: pelvic drop and Lt rotation Curl-up test: large distortion, breath holding Bed mobility: poor mechanics, breath holding, pain   GAIT: Assistive device utilized: None Comments: Ambulating with decreased gait speed and stride  length, walking boot on Rt LE for achilles tendonitis   POSTURE: rounded shoulders, forward head, decreased lumbar lordosis, increased thoracic kyphosis, posterior pelvic tilt, and Rt posterior rotation   LUMBARAROM/PROM:  A/PROM A/PROM  Eval (% available)  Flexion 25  Extension None - makes it to neutral  Right lateral flexion 25  Left lateral flexion 25  Right rotation 25  Left rotation 25   (Blank rows = not tested)   PALPATION:   General: tightness throughout bil lumbar paraspinals and glutes  Pelvic Alignment: Rt posterior rotation  Abdominal: no tenderness, decreased rib cage mobility                External Perineal Exam: labial fusion, decreased coloration, dryness                              Internal Pelvic Floor: no tenderness  Patient confirms identification and approves PT to assess internal pelvic floor and treatment Yes  PELVIC MMT:   MMT eval  Vaginal 1/5, 3 second hold, 3 repeat contractions; poor coordination and tends to bear down  Diastasis  Recti 6 finger width diastasis with large distortion   (Blank rows = not tested)        TONE: low  PROLAPSE: Grade 1 anterior/posterior vaginal wall laxity  TODAY'S TREATMENT:                                                                                                                              DATE:  08/11/23  EVAL  Neuromuscular re-education: Pt provides verbal consent for internal vaginal/rectal pelvic floor exam. Internal vaginal pelvic floor muscle contraction training Quick flicks Long holds Urge drill   PATIENT EDUCATION:  Education details: See above Person educated: Patient Education method: Explanation, Demonstration, Tactile cues, Verbal cues, and Handouts Education comprehension: verbalized understanding  HOME EXERCISE PROGRAM: [id number]  ASSESSMENT:  CLINICAL IMPRESSION: Patient is a 68 y.o. female who was seen today for physical therapy evaluation and treatment for urinary  incontinence. Exam findings are notable for abnormal posture and gait, decreased lumbar A/ROM, pelvic drop with single leg stance, abnormal squat with large weight shift, poor body mechanics with bed mobility with bearing down, large diastasis recti abdominus with distortion, significant pelvic floor muscle weakness, decreased pelvic floor muscle endurance, poor coordination and pelvic floor muscle contraction with tendency to bear down, and mild anterior/posterior vaginal wall laxity. Signs and symptoms are most consistent with pelvic floor muscle/core weakness and poor pressure management; believe her overall inactivity due to high pain levels are also a contributing factor to bladder control. Initial treatment consisted of pelvic floor muscle contraction training and urge drill; we discussed working on pressure management and how it is related to current dysfunction. She will continue to benefit from skilled PT intervention in order to decrease urinary incontinence, improve abdominal pressure management, increase core strength, decrease low back pain, and begin/progress functional strengthening program.   OBJECTIVE IMPAIRMENTS: Abnormal gait, decreased activity tolerance, decreased coordination, decreased endurance, decreased mobility, decreased strength, hypomobility, increased fascial restrictions, increased muscle spasms, impaired tone, improper body mechanics, postural dysfunction, and pain.   ACTIVITY LIMITATIONS: carrying, lifting, bending, standing, squatting, bed mobility, continence, and locomotion level  PARTICIPATION LIMITATIONS: cleaning and community activity  PERSONAL FACTORS: 3+ comorbidities: medical history  are also affecting patient's functional outcome.   REHAB POTENTIAL: Good  CLINICAL DECISION MAKING: Evolving/moderate complexity  EVALUATION COMPLEXITY: Moderate   GOALS: Goals reviewed with patient? Yes  SHORT TERM GOALS: Target date: 09/08/2023   Pt will be independent  with HEP.   Baseline: Goal status: INITIAL  2.  Pt will be independent with the knack, urge suppression technique, and double voiding in order to improve bladder habits and decrease urinary incontinence.   Baseline:  Goal status: INITIAL  3.  Pt will be able to correctly perform diaphragmatic breathing and appropriate pressure management in order to prevent worsening vaginal wall laxity and improve pelvic floor A/ROM.   Baseline:  Goal status: INITIAL  4.  Pt will report 25% improvement in urinary incontinence.  Baseline:  Goal status: INITIAL  5.  Pt will demonstrate 25% improvement in all lumbar A/ROM Baseline:  Goal status: INITIAL   LONG TERM GOALS: Target date: 11/03/23  Pt will be independent with advanced HEP.   Baseline:  Goal status: INITIAL  2.  Pt will improve pelvic floor muscle strength to at least 3/5.  Baseline:  Goal status: INITIAL  3.  Pt will increase pelvic floor muscle endurance to at least 10 seconds.  Baseline:  Goal status: INITIAL  4.  Pt will report at least 75% improvement in urinary incontinence.  Baseline:  Goal status: INITIAL  5.  Pt will be able to go 2-3 hours in between voids without urgency or incontinence in order to improve QOL and perform all functional activities with less difficulty.   Baseline:  Goal status: INITIAL  6.  Pt will be able to rise from chair and get out of the car without urinary incontinence.  Baseline:  Goal status: INITIAL  PLAN:  PT FREQUENCY: 1-2x/week  PT DURATION: 12 weeks  PLANNED INTERVENTIONS: 97110-Therapeutic exercises, 97530- Therapeutic activity, 97112- Neuromuscular re-education, 97535- Self Care, 78469- Manual therapy, Dry Needling, and Biofeedback  PLAN FOR NEXT SESSION: Begin core strengthening.    Julio Alm, PT, DPT03/06/251:12 PM

## 2023-08-25 ENCOUNTER — Ambulatory Visit: Payer: Medicare Other

## 2023-08-25 DIAGNOSIS — R279 Unspecified lack of coordination: Secondary | ICD-10-CM

## 2023-08-25 DIAGNOSIS — M6281 Muscle weakness (generalized): Secondary | ICD-10-CM | POA: Diagnosis not present

## 2023-08-25 DIAGNOSIS — R293 Abnormal posture: Secondary | ICD-10-CM

## 2023-08-25 DIAGNOSIS — M5459 Other low back pain: Secondary | ICD-10-CM

## 2023-08-25 NOTE — Therapy (Signed)
 OUTPATIENT PHYSICAL THERAPY FEMALE PELVIC TREATMENT   Patient Name: Erica Giles MRN: [id number] DOB:[birth year], 68 y.o., female Today's Date: 08/25/2023  END OF SESSION:  PT End of Session - 08/25/23 1228     Visit Number 2    Date for PT Re-Evaluation 11/03/23    Authorization Type Medicare Kx at 15    Progress Note Due on Visit 10    PT Start Time 1230    PT Stop Time 1310    PT Time Calculation (min) 40 min    Activity Tolerance Patient tolerated treatment well    Behavior During Therapy WFL for tasks assessed/performed              Past Medical History:  Diagnosis Date   Anxiety    Arthritis    Breast cancer (HCC)    left   Cataract    BEGINNING STAGES   CTS (carpal tunnel syndrome)    Depression    Diabetes mellitus    ORAL MED   Family history of breast cancer    GERD (gastroesophageal reflux disease)    Helicobacter pylori gastritis    Hiatal hernia    HNP (herniated nucleus pulposus), lumbar    PT TRYING TO AVOID LUMBAR SURGERY--STATES PAIN IN HER BACK AND SOMETIMES DOWN ONE OR THE OTHER LEG   Hyperlipidemia    Hypertension    Osteopenia    Personal history of radiation therapy    Plantar fasciitis    BILATERAL   Schatzki's ring    Shortness of breath    WITH EXERTION-PT RELATES TO HER WEIGHT   Sleep apnea    MODERATE- PER SLEEP STUDY 06/2010--PT USES CPAP-SETTING IS 11   Past Surgical History:  Procedure Laterality Date   ABDOMINAL HYSTERECTOMY     BREAST LUMPECTOMY     BREAST LUMPECTOMY WITH RADIOACTIVE SEED AND SENTINEL LYMPH NODE BIOPSY Left 03/27/2019   Procedure: LEFT BREAST LUMPECTOMY WITH RADIOACTIVE SEED AND LEFT AXILLARY DEEP SENTINEL LYMPH NODE BIOPSY INJECT BLUE DYE;  Surgeon: Claud Kelp, MD;  Location: [facility] OR;  Service: General;  Laterality: Left;   CARPAL TUNNEL RELEASE     BILATERAL   CHOLECYSTECTOMY     CYST REMOVED     LEFT THUMB AND RT MIDDLE FINGER   TONSILLECTOMY     TRIGGER FINGER REPAIR     UPPER GASTROINTESTINAL  ENDOSCOPY     Patient Active Problem List   Diagnosis Date Noted   Chest pain of uncertain etiology 07/04/2023   HTN (hypertension) 07/04/2023   Iron deficiency anemia due to chronic blood loss 10/14/2022   Multiple lung nodules on CT 09/16/2021   Neck pain 10/25/2019   DM type 2 with diabetic peripheral neuropathy (HCC) 10/25/2019   Genetic testing 07/12/2019   Family history of breast cancer    Breast cancer of upper-outer quadrant of left female breast (HCC) 03/06/2019   GERD (gastroesophageal reflux disease) 12/11/2013   Hemorrhoids 12/11/2013   DM2 (diabetes mellitus, type 2) (HCC) 12/11/2013   DDD (degenerative disc disease) 12/11/2013   HLD (hyperlipidemia) 12/11/2013   S/P cholecystectomy 12/11/2013    PCP: [facility]  REFERRING PROVIDER: Selmer Dominion, NP  REFERRING DIAG: R35.0 (ICD-10-CM) - Urinary frequency N32.81 (ICD-10-CM) - OAB (overactive bladder) N39.3 (ICD-10-CM) - SUI (stress urinary incontinence, female) M62.89 (ICD-10-CM) - Pelvic floor dysfunction in female  THERAPY DIAG:  Muscle weakness (generalized)  Unspecified lack of coordination  Abnormal posture  Other low back pain  Rationale for Evaluation and  Treatment: Rehabilitation  ONSET DATE: 6 months  SUBJECTIVE:                                                                                                                                                                                           SUBJECTIVE STATEMENT: Pt states that she has only had to wear a pad once since her fist visit.   PAIN: 08/25/23 Are you having pain? Yes NPRS scale: 3/10 in bil hips Pain location:  bil hips and low back  Pain type: aching, burning, and sharp Pain description: constant   Aggravating factors: walking, bending, squatting Relieving factors: medication, lying down  PRECAUTIONS: None  RED FLAGS: None   WEIGHT BEARING RESTRICTIONS: No  FALLS:  Has patient fallen in last 6  months? No  OCCUPATION: retired   ACTIVITY LEVEL : no exercise   PLOF: Independent  PATIENT GOALS: improve bladder control  PERTINENT HISTORY:  Hysterectomy 1986, bladder surgery 1986, twin delivery 1985, breast cancer dx 2019 with lumpectomy and radiation, cholecystectomy, osteopenia, hiatal hernia, diabetes mellitus  BOWEL MOVEMENT: Pain with bowel movement: No Type of bowel movement:Frequency every other day and Strain none as long as she takes stool softener Fully empty rectum: No, not always Leakage: Yes: if she has diarrhea Pads: Yes: see below Fiber supplement/laxative Yes - stool softeners   URINATION: Pain with urination: No Fully empty bladder: Yes:   Stream: Strong Urgency: Yes  Frequency: 6x/day, 2x/night Leakage: Urge to void, Walking to the bathroom, and sit>stand, getting out of car; leaks 1-4 times a day Pads: Yes: was using 4 pads a day, but now only using when traveling  INTERCOURSE:  Not currently sexually active  PREGNANCY: Vaginal deliveries 1 Tearing No Episiotomy No C-section deliveries 1 (twins) Currently pregnant No  PROLAPSE: None   OBJECTIVE:  Note: Objective measures were completed at Evaluation unless otherwise noted.  08/11/23:  PATIENT SURVEYS:   PFIQ-7: 48  COGNITION: Overall cognitive status: Within functional limits for tasks assessed     SENSATION: Light touch: Deficits bil LE - neuropathy  FUNCTIONAL TESTS:  Squat: large Rt weight shift, decreased descent Single leg stance:  Rt: pelvic drop  Lt: pelvic drop and Lt rotation Curl-up test: large distortion, breath holding Bed mobility: poor mechanics, breath holding, pain   GAIT: Assistive device utilized: None Comments: Ambulating with decreased gait speed and stride length, walking boot on Rt LE for achilles tendonitis   POSTURE: rounded shoulders, forward head, decreased lumbar lordosis, increased thoracic kyphosis, posterior pelvic tilt, and Rt posterior  rotation   LUMBARAROM/PROM:  A/PROM A/PROM  Eval (% available)  Flexion 25  Extension  None - makes it to neutral  Right lateral flexion 25  Left lateral flexion 25  Right rotation 25  Left rotation 25   (Blank rows = not tested)   PALPATION:   General: tightness throughout bil lumbar paraspinals and glutes  Pelvic Alignment: Rt posterior rotation  Abdominal: no tenderness, decreased rib cage mobility                External Perineal Exam: labial fusion, decreased coloration, dryness                              Internal Pelvic Floor: no tenderness  Patient confirms identification and approves PT to assess internal pelvic floor and treatment Yes  PELVIC MMT:   MMT eval  Vaginal 1/5, 3 second hold, 3 repeat contractions; poor coordination and tends to bear down  Diastasis Recti 6 finger width diastasis with large distortion   (Blank rows = not tested)        TONE: low  PROLAPSE: Grade 1 anterior/posterior vaginal wall laxity  TODAY'S TREATMENT:                                                                                                                              DATE:  08/25/23 Neuromuscular re-education: Transversus abdominus training with multimodal cues for improved motor control and breath coordination Transversus abdominus isometrics 10x Bil supine UE ball press with transversus abdominus and pelvic floor muscle contractions and breath coordination 10x Supine hip adduction ball press with transversus abdominus and pelvic floor muscle contractions and breath coordination 10x  Unilateral side lying UE ball press with transversus abdominus and pelvic floor muscle contractions and breath coordination 10x Unilateral side lying UE ball press with clam shell with transversus abdominus and pelvic floor muscle contractions and breath coordination 10x Seated hip adduction ball press with transversus abdominus and pelvic floor muscle 2 x 10 Seated hip abduction red  band with transversus abdominus and pelvic floor muscle 2 x 10 Seated resisted march red band with transversus abdominus and pelvic floor muscle 2 x 10 Seated single resisted clam red band 10x bil  08/11/23  EVAL  Neuromuscular re-education: Pt provides verbal consent for internal vaginal/rectal pelvic floor exam. Internal vaginal pelvic floor muscle contraction training Quick flicks Long holds Urge drill   PATIENT EDUCATION:  Education details: See above Person educated: Patient Education method: Programmer, multimedia, Demonstration, Tactile cues, Verbal cues, and Handouts Education comprehension: verbalized understanding  HOME EXERCISE PROGRAM: [id number]  ASSESSMENT:  CLINICAL IMPRESSION: Pyt doing extremely well after first session with only having had to wear one pad in the last two weeks. She feels like she has significantly more control over her bladder. Today we performed core training and progressions of supine and seated exercises that did not aggravate Rt foot. She did very well with all activities. HEP updated. She will continue to benefit from skilled PT intervention in order  to decrease urinary incontinence, improve abdominal pressure management, increase core strength, decrease low back pain, and begin/progress functional strengthening program.   OBJECTIVE IMPAIRMENTS: Abnormal gait, decreased activity tolerance, decreased coordination, decreased endurance, decreased mobility, decreased strength, hypomobility, increased fascial restrictions, increased muscle spasms, impaired tone, improper body mechanics, postural dysfunction, and pain.   ACTIVITY LIMITATIONS: carrying, lifting, bending, standing, squatting, bed mobility, continence, and locomotion level  PARTICIPATION LIMITATIONS: cleaning and community activity  PERSONAL FACTORS: 3+ comorbidities: medical history  are also affecting patient's functional outcome.   REHAB POTENTIAL: Good  CLINICAL DECISION MAKING:  Evolving/moderate complexity  EVALUATION COMPLEXITY: Moderate   GOALS: Goals reviewed with patient? Yes  SHORT TERM GOALS: Target date: 09/08/2023   Pt will be independent with HEP.   Baseline: Goal status: INITIAL  2.  Pt will be independent with the knack, urge suppression technique, and double voiding in order to improve bladder habits and decrease urinary incontinence.   Baseline:  Goal status: INITIAL  3.  Pt will be able to correctly perform diaphragmatic breathing and appropriate pressure management in order to prevent worsening vaginal wall laxity and improve pelvic floor A/ROM.   Baseline:  Goal status: INITIAL  4.  Pt will report 25% improvement in urinary incontinence.  Baseline:  Goal status: INITIAL  5.  Pt will demonstrate 25% improvement in all lumbar A/ROM Baseline:  Goal status: INITIAL   LONG TERM GOALS: Target date: 11/03/23  Pt will be independent with advanced HEP.   Baseline:  Goal status: INITIAL  2.  Pt will improve pelvic floor muscle strength to at least 3/5.  Baseline:  Goal status: INITIAL  3.  Pt will increase pelvic floor muscle endurance to at least 10 seconds.  Baseline:  Goal status: INITIAL  4.  Pt will report at least 75% improvement in urinary incontinence.  Baseline:  Goal status: INITIAL  5.  Pt will be able to go 2-3 hours in between voids without urgency or incontinence in order to improve QOL and perform all functional activities with less difficulty.   Baseline:  Goal status: INITIAL  6.  Pt will be able to rise from chair and get out of the car without urinary incontinence.  Baseline:  Goal status: INITIAL  PLAN:  PT FREQUENCY: 1-2x/week  PT DURATION: 12 weeks  PLANNED INTERVENTIONS: 97110-Therapeutic exercises, 97530- Therapeutic activity, 97112- Neuromuscular re-education, 97535- Self Care, 16109- Manual therapy, Dry Needling, and Biofeedback  PLAN FOR NEXT SESSION: Begin core strengthening.    Julio Alm, PT, DPT03/20/251:13 PM

## 2023-09-01 ENCOUNTER — Ambulatory Visit: Payer: Medicare Other

## 2023-09-01 DIAGNOSIS — M6281 Muscle weakness (generalized): Secondary | ICD-10-CM

## 2023-09-01 DIAGNOSIS — R279 Unspecified lack of coordination: Secondary | ICD-10-CM

## 2023-09-01 DIAGNOSIS — M5459 Other low back pain: Secondary | ICD-10-CM

## 2023-09-01 DIAGNOSIS — R293 Abnormal posture: Secondary | ICD-10-CM

## 2023-09-01 NOTE — Therapy (Addendum)
 OUTPATIENT PHYSICAL THERAPY FEMALE PELVIC TREATMENT   Patient Name: Erica Giles MRN: [id number] DOB:1956/02/07, 68 y.o., female Today's Date: 09/01/2023  END OF SESSION:  PT End of Session - 09/01/23 1217     Visit Number 3    Date for PT Re-Evaluation 11/03/23    Authorization Type Medicare Kx at 15    Progress Note Due on Visit 10    PT Start Time 1230    PT Stop Time 1300    PT Time Calculation (min) 30 min    Activity Tolerance Patient tolerated treatment well    Behavior During Therapy WFL for tasks assessed/performed               Past Medical History:  Diagnosis Date   Anxiety    Arthritis    Breast cancer (HCC)    left   Cataract    BEGINNING STAGES   CTS (carpal tunnel syndrome)    Depression    Diabetes mellitus    ORAL MED   Family history of breast cancer    GERD (gastroesophageal reflux disease)    Helicobacter pylori gastritis    Hiatal hernia    HNP (herniated nucleus pulposus), lumbar    PT TRYING TO AVOID LUMBAR SURGERY--STATES PAIN IN HER BACK AND SOMETIMES DOWN ONE OR THE OTHER LEG   Hyperlipidemia    Hypertension    Osteopenia    Personal history of radiation therapy    Plantar fasciitis    BILATERAL   Schatzki's ring    Shortness of breath    WITH EXERTION-PT RELATES TO HER WEIGHT   Sleep apnea    MODERATE- PER SLEEP STUDY 06/2010--PT USES CPAP-SETTING IS 11   Past Surgical History:  Procedure Laterality Date   ABDOMINAL HYSTERECTOMY     BREAST LUMPECTOMY     BREAST LUMPECTOMY WITH RADIOACTIVE SEED AND SENTINEL LYMPH NODE BIOPSY Left 03/27/2019   Procedure: LEFT BREAST LUMPECTOMY WITH RADIOACTIVE SEED AND LEFT AXILLARY DEEP SENTINEL LYMPH NODE BIOPSY INJECT BLUE DYE;  Surgeon: Boyce Byes, MD;  Location: [facility] OR;  Service: General;  Laterality: Left;   CARPAL TUNNEL RELEASE     BILATERAL   CHOLECYSTECTOMY     CYST REMOVED     LEFT THUMB AND RT MIDDLE FINGER   TONSILLECTOMY     TRIGGER FINGER REPAIR     UPPER GASTROINTESTINAL  ENDOSCOPY     Patient Active Problem List   Diagnosis Date Noted   Chest pain of uncertain etiology 07/04/2023   HTN (hypertension) 07/04/2023   Iron deficiency anemia due to chronic blood loss 10/14/2022   Multiple lung nodules on CT 09/16/2021   Neck pain 10/25/2019   DM type 2 with diabetic peripheral neuropathy (HCC) 10/25/2019   Genetic testing 07/12/2019   Family history of breast cancer    Breast cancer of upper-outer quadrant of left female breast (HCC) 03/06/2019   GERD (gastroesophageal reflux disease) 12/11/2013   Hemorrhoids 12/11/2013   DM2 (diabetes mellitus, type 2) (HCC) 12/11/2013   DDD (degenerative disc disease) 12/11/2013   HLD (hyperlipidemia) 12/11/2013   S/P cholecystectomy 12/11/2013    PCP: [facility]  REFERRING PROVIDER: Zuleta, Kaitlin G, NP  REFERRING DIAG: R35.0 (ICD-10-CM) - Urinary frequency N32.81 (ICD-10-CM) - OAB (overactive bladder) N39.3 (ICD-10-CM) - SUI (stress urinary incontinence, female) M62.89 (ICD-10-CM) - Pelvic floor dysfunction in female  THERAPY DIAG:  Muscle weakness (generalized)  Unspecified lack of coordination  Abnormal posture  Other low back pain  Rationale for Evaluation  and Treatment: Rehabilitation  ONSET DATE: 6 months  SUBJECTIVE:                                                                                                                                                                                           SUBJECTIVE STATEMENT: Pt states that she needs to leave early today. She reports continued improvements with bladder.   PAIN: 09/01/23 Are you having pain? Yes NPRS scale: 3/10 in bil hips Pain location:  bil hips and low back  Pain type: aching, burning, and sharp Pain description: constant   Aggravating factors: walking, bending, squatting Relieving factors: medication, lying down  PRECAUTIONS: None  RED FLAGS: None   WEIGHT BEARING RESTRICTIONS: No  FALLS:  Has  patient fallen in last 6 months? No  OCCUPATION: retired   ACTIVITY LEVEL : no exercise   PLOF: Independent  PATIENT GOALS: improve bladder control  PERTINENT HISTORY:  Hysterectomy 1986, bladder surgery 1986, twin delivery 1985, breast cancer dx 2019 with lumpectomy and radiation, cholecystectomy, osteopenia, hiatal hernia, diabetes mellitus  BOWEL MOVEMENT: Pain with bowel movement: No Type of bowel movement:Frequency every other day and Strain none as long as she takes stool softener Fully empty rectum: No, not always Leakage: Yes: if she has diarrhea Pads: Yes: see below Fiber supplement/laxative Yes - stool softeners   URINATION: Pain with urination: No Fully empty bladder: Yes:   Stream: Strong Urgency: Yes  Frequency: 6x/day, 2x/night Leakage: Urge to void, Walking to the bathroom, and sit>stand, getting out of car; leaks 1-4 times a day Pads: Yes: was using 4 pads a day, but now only using when traveling  INTERCOURSE:  Not currently sexually active  PREGNANCY: Vaginal deliveries 1 Tearing No Episiotomy No C-section deliveries 1 (twins) Currently pregnant No  PROLAPSE: None   OBJECTIVE:  Note: Objective measures were completed at Evaluation unless otherwise noted.  08/11/23:  PATIENT SURVEYS:   PFIQ-7: 48  COGNITION: Overall cognitive status: Within functional limits for tasks assessed     SENSATION: Light touch: Deficits bil LE - neuropathy  FUNCTIONAL TESTS:  Squat: large Rt weight shift, decreased descent Single leg stance:  Rt: pelvic drop  Lt: pelvic drop and Lt rotation Curl-up test: large distortion, breath holding Bed mobility: poor mechanics, breath holding, pain   GAIT: Assistive device utilized: None Comments: Ambulating with decreased gait speed and stride length, walking boot on Rt LE for achilles tendonitis   POSTURE: rounded shoulders, forward head, decreased lumbar lordosis, increased thoracic kyphosis, posterior pelvic  tilt, and Rt posterior rotation   LUMBARAROM/PROM:  A/PROM A/PROM  Eval (% available)  Flexion 25  Extension  None - makes it to neutral  Right lateral flexion 25  Left lateral flexion 25  Right rotation 25  Left rotation 25   (Blank rows = not tested)   PALPATION:   General: tightness throughout bil lumbar paraspinals and glutes  Pelvic Alignment: Rt posterior rotation  Abdominal: no tenderness, decreased rib cage mobility                External Perineal Exam: labial fusion, decreased coloration, dryness                              Internal Pelvic Floor: no tenderness  Patient confirms identification and approves PT to assess internal pelvic floor and treatment Yes  PELVIC MMT:   MMT eval  Vaginal 1/5, 3 second hold, 3 repeat contractions; poor coordination and tends to bear down  Diastasis Recti 6 finger width diastasis with large distortion   (Blank rows = not tested)        TONE: low  PROLAPSE: Grade 1 anterior/posterior vaginal wall laxity  TODAY'S TREATMENT:                                                                                                                              DATE:  09/01/23 Neuromuscular re-education: Unilateral side lying UE ball press in clam shell with transversus abdominus and pelvic floor muscle contractions and breath coordination 10x Side lying horizontal abduction 1lb 2 x 10 bil with transversus abdominus  Seated hip adduction ball press with transversus abdominus and pelvic floor muscle 2 x 10 Seated horizontal abduction 2 x 10 red band  08/25/23 Neuromuscular re-education: Transversus abdominus training with multimodal cues for improved motor control and breath coordination Transversus abdominus isometrics 10x Bil supine UE ball press with transversus abdominus and pelvic floor muscle contractions and breath coordination 10x Supine hip adduction ball press with transversus abdominus and pelvic floor muscle contractions and  breath coordination 10x  Unilateral side lying UE ball press with transversus abdominus and pelvic floor muscle contractions and breath coordination 10x Unilateral side lying UE ball press with clam shell with transversus abdominus and pelvic floor muscle contractions and breath coordination 10x Seated hip adduction ball press with transversus abdominus and pelvic floor muscle 2 x 10 Seated hip abduction red band with transversus abdominus and pelvic floor muscle 2 x 10 Seated resisted march red band with transversus abdominus and pelvic floor muscle 2 x 10 Seated single resisted clam red band 10x bil  08/11/23  EVAL  Neuromuscular re-education: Pt provides verbal consent for internal vaginal/rectal pelvic floor exam. Internal vaginal pelvic floor muscle contraction training Quick flicks Long holds Urge drill   PATIENT EDUCATION:  Education details: See above Person educated: Patient Education method: Explanation, Demonstration, Tactile cues, Verbal cues, and Handouts Education comprehension: verbalized understanding  HOME EXERCISE PROGRAM: [id number]  ASSESSMENT:  CLINICAL IMPRESSION: Pt demonstrated good tolerance to all exercises. She  continues to see improvement in bladder control. HEP not updated at this time. She will continue to benefit from skilled PT intervention in order to decrease urinary incontinence, improve abdominal pressure management, increase core strength, decrease low back pain, and begin/progress functional strengthening program.   OBJECTIVE IMPAIRMENTS: Abnormal gait, decreased activity tolerance, decreased coordination, decreased endurance, decreased mobility, decreased strength, hypomobility, increased fascial restrictions, increased muscle spasms, impaired tone, improper body mechanics, postural dysfunction, and pain.   ACTIVITY LIMITATIONS: carrying, lifting, bending, standing, squatting, bed mobility, continence, and locomotion level  PARTICIPATION  LIMITATIONS: cleaning and community activity  PERSONAL FACTORS: 3+ comorbidities: medical history  are also affecting patient's functional outcome.   REHAB POTENTIAL: Good  CLINICAL DECISION MAKING: Evolving/moderate complexity  EVALUATION COMPLEXITY: Moderate   GOALS: Goals reviewed with patient? Yes  SHORT TERM GOALS: Target date: 09/08/2023   Pt will be independent with HEP.   Baseline: Goal status: INITIAL  2.  Pt will be independent with the knack, urge suppression technique, and double voiding in order to improve bladder habits and decrease urinary incontinence.   Baseline:  Goal status: INITIAL  3.  Pt will be able to correctly perform diaphragmatic breathing and appropriate pressure management in order to prevent worsening vaginal wall laxity and improve pelvic floor A/ROM.   Baseline:  Goal status: INITIAL  4.  Pt will report 25% improvement in urinary incontinence.  Baseline:  Goal status: INITIAL  5.  Pt will demonstrate 25% improvement in all lumbar A/ROM Baseline:  Goal status: INITIAL   LONG TERM GOALS: Target date: 11/03/23  Pt will be independent with advanced HEP.   Baseline:  Goal status: INITIAL  2.  Pt will improve pelvic floor muscle strength to at least 3/5.  Baseline:  Goal status: INITIAL  3.  Pt will increase pelvic floor muscle endurance to at least 10 seconds.  Baseline:  Goal status: INITIAL  4.  Pt will report at least 75% improvement in urinary incontinence.  Baseline:  Goal status: INITIAL  5.  Pt will be able to go 2-3 hours in between voids without urgency or incontinence in order to improve QOL and perform all functional activities with less difficulty.   Baseline:  Goal status: INITIAL  6.  Pt will be able to rise from chair and get out of the car without urinary incontinence.  Baseline:  Goal status: INITIAL  PLAN:  PT FREQUENCY:-  PT DURATION:-  PLANNED INTERVENTIONS: -  PLAN FOR NEXT SESSION: -  PHYSICAL  THERAPY DISCHARGE SUMMARY  Visits from Start of Care: 3  Current functional level related to goals / functional outcomes: Not met   Remaining deficits: See above   Education / Equipment: HEP   Patient agrees to discharge. Patient goals were partially met. Patient is being discharged due to the patient's request.    Verlena Glenn, PT, DPT03/27/251:18 PM

## 2023-09-28 ENCOUNTER — Telehealth: Payer: Self-pay

## 2023-09-28 ENCOUNTER — Ambulatory Visit: Attending: Obstetrics and Gynecology

## 2023-09-28 DIAGNOSIS — M6281 Muscle weakness (generalized): Secondary | ICD-10-CM | POA: Insufficient documentation

## 2023-09-28 DIAGNOSIS — M5459 Other low back pain: Secondary | ICD-10-CM | POA: Insufficient documentation

## 2023-09-28 DIAGNOSIS — R350 Frequency of micturition: Secondary | ICD-10-CM | POA: Insufficient documentation

## 2023-09-28 DIAGNOSIS — M6289 Other specified disorders of muscle: Secondary | ICD-10-CM | POA: Insufficient documentation

## 2023-09-28 DIAGNOSIS — R293 Abnormal posture: Secondary | ICD-10-CM | POA: Insufficient documentation

## 2023-09-28 DIAGNOSIS — R279 Unspecified lack of coordination: Secondary | ICD-10-CM | POA: Insufficient documentation

## 2023-09-28 DIAGNOSIS — N3281 Overactive bladder: Secondary | ICD-10-CM | POA: Insufficient documentation

## 2023-09-28 DIAGNOSIS — N393 Stress incontinence (female) (male): Secondary | ICD-10-CM | POA: Insufficient documentation

## 2023-09-28 NOTE — Telephone Encounter (Signed)
 Called and spoke with patient after no-show appointment. She states that she did not have appointment written down, but she cannot leave husband after he has taken a bad fall. She would like to go ahead and discharge at this time. We will cancel future appointment. She was encouraged if she needs to come back in the future, she will just need to get a new referral sent over and we will be happy to get her back on the schedule.

## 2023-10-05 ENCOUNTER — Encounter

## 2023-11-09 ENCOUNTER — Inpatient Hospital Stay: Payer: Medicare Other | Attending: Hematology

## 2023-11-09 DIAGNOSIS — Z1732 Human epidermal growth factor receptor 2 negative status: Secondary | ICD-10-CM | POA: Diagnosis not present

## 2023-11-09 DIAGNOSIS — R918 Other nonspecific abnormal finding of lung field: Secondary | ICD-10-CM | POA: Diagnosis not present

## 2023-11-09 DIAGNOSIS — Z79899 Other long term (current) drug therapy: Secondary | ICD-10-CM | POA: Diagnosis not present

## 2023-11-09 DIAGNOSIS — D509 Iron deficiency anemia, unspecified: Secondary | ICD-10-CM | POA: Insufficient documentation

## 2023-11-09 DIAGNOSIS — C50412 Malignant neoplasm of upper-outer quadrant of left female breast: Secondary | ICD-10-CM | POA: Diagnosis present

## 2023-11-09 DIAGNOSIS — Z923 Personal history of irradiation: Secondary | ICD-10-CM | POA: Insufficient documentation

## 2023-11-09 DIAGNOSIS — Z79811 Long term (current) use of aromatase inhibitors: Secondary | ICD-10-CM | POA: Insufficient documentation

## 2023-11-09 DIAGNOSIS — M8580 Other specified disorders of bone density and structure, unspecified site: Secondary | ICD-10-CM | POA: Diagnosis not present

## 2023-11-09 DIAGNOSIS — Z170 Estrogen receptor positive status [ER+]: Secondary | ICD-10-CM | POA: Diagnosis not present

## 2023-11-09 DIAGNOSIS — Z87891 Personal history of nicotine dependence: Secondary | ICD-10-CM | POA: Insufficient documentation

## 2023-11-09 DIAGNOSIS — M8589 Other specified disorders of bone density and structure, multiple sites: Secondary | ICD-10-CM

## 2023-11-09 DIAGNOSIS — E559 Vitamin D deficiency, unspecified: Secondary | ICD-10-CM

## 2023-11-09 DIAGNOSIS — Z1721 Progesterone receptor positive status: Secondary | ICD-10-CM | POA: Insufficient documentation

## 2023-11-09 DIAGNOSIS — D500 Iron deficiency anemia secondary to blood loss (chronic): Secondary | ICD-10-CM

## 2023-11-09 DIAGNOSIS — Z803 Family history of malignant neoplasm of breast: Secondary | ICD-10-CM | POA: Diagnosis not present

## 2023-11-09 LAB — CBC WITH DIFFERENTIAL/PLATELET
Abs Immature Granulocytes: 0.02 10*3/uL (ref 0.00–0.07)
Basophils Absolute: 0 10*3/uL (ref 0.0–0.1)
Basophils Relative: 1 %
Eosinophils Absolute: 0.1 10*3/uL (ref 0.0–0.5)
Eosinophils Relative: 2 %
HCT: 35.9 % — ABNORMAL LOW (ref 36.0–46.0)
Hemoglobin: 12 g/dL (ref 12.0–15.0)
Immature Granulocytes: 0 %
Lymphocytes Relative: 28 %
Lymphs Abs: 1.6 10*3/uL (ref 0.7–4.0)
MCH: 30.4 pg (ref 26.0–34.0)
MCHC: 33.4 g/dL (ref 30.0–36.0)
MCV: 90.9 fL (ref 80.0–100.0)
Monocytes Absolute: 0.4 10*3/uL (ref 0.1–1.0)
Monocytes Relative: 7 %
Neutro Abs: 3.6 10*3/uL (ref 1.7–7.7)
Neutrophils Relative %: 62 %
Platelets: 205 10*3/uL (ref 150–400)
RBC: 3.95 MIL/uL (ref 3.87–5.11)
RDW: 13.2 % (ref 11.5–15.5)
WBC: 5.7 10*3/uL (ref 4.0–10.5)
nRBC: 0 % (ref 0.0–0.2)

## 2023-11-09 LAB — IRON AND TIBC
Iron: 63 ug/dL (ref 28–170)
Saturation Ratios: 18 % (ref 10.4–31.8)
TIBC: 346 ug/dL (ref 250–450)
UIBC: 283 ug/dL

## 2023-11-09 LAB — COMPREHENSIVE METABOLIC PANEL WITH GFR
ALT: 18 U/L (ref 0–44)
AST: 15 U/L (ref 15–41)
Albumin: 3.6 g/dL (ref 3.5–5.0)
Alkaline Phosphatase: 119 U/L (ref 38–126)
Anion gap: 9 (ref 5–15)
BUN: 15 mg/dL (ref 8–23)
CO2: 27 mmol/L (ref 22–32)
Calcium: 9.5 mg/dL (ref 8.9–10.3)
Chloride: 96 mmol/L — ABNORMAL LOW (ref 98–111)
Creatinine, Ser: 0.89 mg/dL (ref 0.44–1.00)
GFR, Estimated: >60 mL/min (ref >60)
Glucose, Bld: 382 mg/dL — ABNORMAL HIGH (ref 70–99)
Potassium: 4.5 mmol/L (ref 3.5–5.1)
Sodium: 132 mmol/L — ABNORMAL LOW (ref 135–145)
Total Bilirubin: 0.7 mg/dL (ref 0.0–1.2)
Total Protein: 6.8 g/dL (ref 6.5–8.1)

## 2023-11-09 LAB — FERRITIN: Ferritin: 46 ng/mL (ref 11–307)

## 2023-11-09 LAB — VITAMIN D 25 HYDROXY (VIT D DEFICIENCY, FRACTURES): Vit D, 25-Hydroxy: 44.55 ng/mL (ref 30–100)

## 2023-11-15 NOTE — Progress Notes (Unsigned)
 [facility] [address], [place] [address]   CLINIC:  Medical Oncology/Hematology  PCP:  Practice, [facility] [address] [place] / [place] [address] [phone]   REASON FOR VISIT:  Follow-up for lung nodules with history of breast cancer   PRIOR THERAPY:  Left lumpectomy on 03/27/2019 XRT completed on 07/18/2019   NGS Results: Not done   CURRENT THERAPY: Anastrozole   BRIEF ONCOLOGIC HISTORY:   Oncology History  Breast cancer of upper-outer quadrant of left female breast (HCC)  03/06/2019 Initial Diagnosis   Breast cancer of upper-outer quadrant of left female breast (HCC)   04/24/2019 Cancer Staging   Staging form: Breast, AJCC 8th Edition - Clinical: Stage IB (cT1c, cN1, cM0, G2, ER+, PR+, HER2-) - Signed by Paulett Boros, MD on 04/24/2019   07/11/2019 Genetic Testing   Negative genetic testing on the common hereditary cancer panel.  The Common Hereditary Gene Panel offered by [facility] includes sequencing and/or deletion duplication testing of the following 48 genes: APC, ATM, AXIN2, BARD1, BMPR1A, BRCA1, BRCA2, BRIP1, CDH1, CDK4, CDKN2A (p14ARF), CDKN2A (p16INK4a), CHEK2, CTNNA1, DICER1, EPCAM (Deletion/duplication testing only), GREM1 (promoter region deletion/duplication testing only), KIT, MEN1, MLH1, MSH2, MSH3, MSH6, MUTYH, NBN, NF1, NHTL1, PALB2, PDGFRA, PMS2, POLD1, POLE, PTEN, RAD50, RAD51C, RAD51D, RNF43, SDHB, SDHC, SDHD, SMAD4, SMARCA4. STK11, TP53, TSC1, TSC2, and VHL.  The following genes were evaluated for sequence changes only: SDHA and HOXB13 c.251G>A variant only. The report date is July 11, 2019.        CANCER STAGING:  Cancer Staging  Breast cancer of upper-outer quadrant of left female breast (HCC) Staging form: Breast, AJCC 8th Edition - Clinical: Stage IB (cT1c, cN1, cM0, G2, ER+, PR+, HER2-) - Signed by Paulett Boros, MD on 04/24/2019   INTERVAL HISTORY:   [name], a 68 y.o. female, returns for  routine follow-up of her lung nodules and history of breast cancer. Bula was last seen on 05/17/2023 by Sheril Dines PA-C.    At today's visit, she  reports feeling fair, apart from some issues with her right foot (torn Achilles).   She has not noticed any new breast lumps or axillary lymphadenopathy.    She has no new abdominal pain, headaches, seizures, or focal neurologic deficits.   She denies any new dyspnea, cough, hemoptysis, or voice changes.  She denies any recent infections or antibiotic use.   No B symptoms such as fever, chills, unintentional weight loss.   She denies any recent chest pain.  She continues to take Arimidex  daily and is tolerating this fairly well.   She has ongoing musculoskeletal pain that she attributes to arthritis.    She is taking pregabalin for her neuropathy.   She does also report some hot flashes and mild night sweats, reportedly tolerable.   She takes daily calcium/vitamin D .   She denies any recent bone pain or fractures.  She is taking iron supplement daily with stool softener.   Reports dark stools since starting oral iron, but denies any frank melena or hematochezia. She has had some increased fatigue and restless legs.    She reports 25% energy and 100% appetite.  She is maintaining stable weight at this time.  ASSESSMENT & PLAN:  1.  Stage Ib (T1CN1) left breast IDC: - Initial diagnosis September 2020, with a screening mammogram (02/16/2019) showing suspicious 0.9 cm mass in the 230 to 3 o'clock position of the left breast - Ultrasound-guided biopsy (02/20/2019): Grade 2 invasive ductal  carcinoma, positive for ER/PR and negative for HER2. - Lumpectomy and sentinel lymph node biopsy by Dr. Lauralee Poll on 03/17/2019.  Pathology showed grade 2 IDC, 1.1 cm, negative margins, negative LVSI/perineural invasion, 1/1 lymph node positive for metastatic carcinoma measuring 2.7 mm. PT1CPN1A.  ER 100%, PR 100%, Ki-67 2%, HER2 2+, negative by FISH.  -  Oncotype DX results showed recurrence score of 8.  No apparent benefit from chemotherapy.  9-year breast cancer specific survival is 98%. - Completed radiation therapy on 07/18/2019 - She started anastrozole  on 04/24/2019.  She is continuing anastrozole  without any major problems apart from some increased arthralgia. - BCI testing (10/21/2022) shows no benefit from extended endocrine therapy.  Risk of late distant recurrence is 8.3% regardless of 5 versus 10 years of adjuvant endocrine therapy.  Goal of anastrozole  is 5 years, to be completed in November 2025.   - Most recent mammogram, screening (03/07/2023): BI-RADS Category 1, negative - Physical examination today (11/16/23)  did not reveal any palpable masses.     - No red flag symptoms of recurrence. - Most recent labs (11/09/2023): Overall unremarkable CBC, normal LFTs.   - PLAN: Annual screening mammogram next due in September 2025. - Repeat labs and RTC in 6 months for office visit and physical exam.    - Continue anastrozole .   2.  Bilateral lung nodules: - She had a fall on 08/22/2021 with left lower rib fractures. - CT chest with contrast (09/10/2021): Multiple bilateral lung nodules, largest irregular nodule of the posterior inferior right upper lobe measuring 1.7 x 1.0 cm, 0.4 cm nodule in the left apex, right lower lobe 0.4 cm lung nodule. - PET scan (10/01/2021) did not show uptake in the 1.7 cm irregular nodular lesion in the right upper lobe.  No lymphadenopathy. - CT chest with contrast (04/12/2022): Stable 1.7 cm multilobular nodule in the posterior right upper lobe.  Other scattered tiny bilateral lung nodules are unchanged when compared to scan from 09/10/2021.  No mediastinal or hilar adenopathy. - Follow-up CT chest (04/25/2023): Stable bilateral pulmonary nodules, most pronounced within right upper lobe (1.5 x 0.8 cm).  Other small pulmonary nodules as described.  No new pulmonary nodules or masses. - Denies any dyspnea, cough,  hemoptysis, or voice changes.  No B-symptoms or unintentional weight loss. - PLAN: Will plan on follow-up imaging with repeat CT chest in November 2025   3.  Iron deficiency anemia, mild  - Labs from 10/06/2022 showed Hgb 11.6/MCV 93.8, ferritin 27, iron saturation 19% - EGD/colonoscopy (05/12/2022): Colonoscopy revealed multiple polyps (tubular adenoma) and internal hemorrhoids.  Endoscopy revealed esophageal inflammation, Schatzki ring, 3 cm hiatal hernia. - No rectal bleeding or melena. - Mild fatigue, but with energy improved after IV iron infusions - She has been taking iron tablet daily for the past year with minimal improvement. - Received Feraheme x 2 in May 2024. - Most recent labs (11/09/2023): Hgb 12.0/MCV 90.9, ferritin 46, iron saturation 18%. - PLAN: Although ferritin is <100, patient would like to hold off on IV iron at this time. -- Decrease iron to every other day due to constipation - Repeat CBC, B12, MMA, iron panel at follow-up in 6 months.   4.  Bone health: - DEXA scan on 04/19/2019 shows T score of -1. - DEXA scan on 04/20/2021 shows T score of -1.3, mild osteopenia - Most recent DEXA scan (04/25/2023): T-score -1.4, stable mild osteopenia  - Started on Fosamax  in September 2022, but patient stopped taking this in  2023.   - Most recent vitamin D  (11/09/2023) normalized at 44.55.  Normal calcium 9.5. - She is taking combination calcium/vitamin D  as well as vitamin D  1000 units daily. - PLAN: Continue combination calcium/vitamin D  with addition of vitamin D  1000 units daily. - Continue weightbearing exercises for bone loss associated with breast cancer therapy. - Next DEXA scan due November 2026.  If any worsening, will consider reintroducing bisphosphonates/Prolia.  5.  Other history: - She works at [employer]  She quit smoking in 2009 after smoking 1 PPD x 20 years. - Paternal aunts x 3 had breast cancer.  Maternal aunt with breast cancer.  [facility] germline mutation testing  was negative.  PLAN SUMMARY:  >> Mammogram due September 2025 >> CT chest for lung nodule follow-up due November 2025 >> Labs in 6 months = CBC/D, CMP, vitamin D , ferritin, iron/TIBC, B12, MMA >> OFFICE visit in 6 months (1 week after labs)    REVIEW OF SYSTEMS:   Review of Systems  Constitutional:  Positive for fatigue (mild). Negative for appetite change, chills, diaphoresis, fever and unexpected weight change.  HENT:   Negative for lump/mass and nosebleeds.   Eyes:  Negative for eye problems.  Respiratory:  Positive for shortness of breath (with exertion). Negative for cough and hemoptysis.   Cardiovascular:  Negative for chest pain (Intermittent, none today), leg swelling and palpitations.  Gastrointestinal:  Positive for constipation. Negative for abdominal pain, blood in stool, diarrhea, nausea and vomiting.  Genitourinary:  Positive for bladder incontinence. Negative for hematuria.   Musculoskeletal:  Positive for arthralgias.  Skin: Negative.   Neurological:  Positive for dizziness and numbness (Neuropathy). Negative for headaches and light-headedness.  Hematological:  Does not bruise/bleed easily.  Psychiatric/Behavioral:  Positive for depression. The patient is nervous/anxious.     PHYSICAL EXAM:   Performance status (ECOG): 1 - Symptomatic but completely ambulatory  Vitals:   11/16/23 0915  BP: 121/72  Pulse: 89  Resp: 20  Temp: 97.8 F (36.6 C)  SpO2: 96%    Wt Readings from Last 3 Encounters:  11/16/23 214 lb 4.6 oz (97.2 kg)  07/04/23 236 lb 12.8 oz (107.4 kg)  05/17/23 232 lb (105.2 kg)   Physical Exam Constitutional:      Appearance: Normal appearance. She is obese.  HENT:     Head: Normocephalic and atraumatic.     Mouth/Throat:     Mouth: Mucous membranes are moist.  Eyes:     Extraocular Movements: Extraocular movements intact.     Pupils: Pupils are equal, round, and reactive to light.  Cardiovascular:     Rate and Rhythm: Normal rate and  regular rhythm.     Pulses: Normal pulses.     Heart sounds: Normal heart sounds.  Pulmonary:     Effort: Pulmonary effort is normal.     Breath sounds: Normal breath sounds.  Chest:  Breasts:    Right: Normal.     Left: No swelling, inverted nipple, mass, nipple discharge, skin change or tenderness.       Comments: Mild edema and tenderness of left lateral breast, near lumpectomy scar.  No discrete nodules or masses in either breast. Abdominal:     General: Bowel sounds are normal.     Palpations: Abdomen is soft.     Tenderness: There is no abdominal tenderness.  Musculoskeletal:        General: No swelling.     Right lower leg: Edema present.     Left lower leg:  Edema present.     Comments: 1+ edema of left calf/pretibial region. 1+ edema of right foot.   Lymphadenopathy:     Cervical: No cervical adenopathy.     Upper Body:     Right upper body: No supraclavicular, axillary or pectoral adenopathy.     Left upper body: No supraclavicular, axillary or pectoral adenopathy.  Skin:    General: Skin is warm and dry.  Neurological:     General: No focal deficit present.     Mental Status: She is alert and oriented to person, place, and time.  Psychiatric:        Mood and Affect: Mood normal.        Behavior: Behavior normal.     PAST MEDICAL/SURGICAL HISTORY:  Past Medical History:  Diagnosis Date   Anxiety    Arthritis    Breast cancer (HCC)    left   Cataract    BEGINNING STAGES   CTS (carpal tunnel syndrome)    Depression    Diabetes mellitus    ORAL MED   Family history of breast cancer    GERD (gastroesophageal reflux disease)    Helicobacter pylori gastritis    Hiatal hernia    HNP (herniated nucleus pulposus), lumbar    PT TRYING TO AVOID LUMBAR SURGERY--STATES PAIN IN HER BACK AND SOMETIMES DOWN ONE OR THE OTHER LEG   Hyperlipidemia    Hypertension    Osteopenia    Personal history of radiation therapy    Plantar fasciitis    BILATERAL   Schatzki's  ring    Shortness of breath    WITH EXERTION-PT RELATES TO HER WEIGHT   Sleep apnea    MODERATE- PER SLEEP STUDY 06/2010--PT USES CPAP-SETTING IS 11   Past Surgical History:  Procedure Laterality Date   ABDOMINAL HYSTERECTOMY     BREAST LUMPECTOMY     BREAST LUMPECTOMY WITH RADIOACTIVE SEED AND SENTINEL LYMPH NODE BIOPSY Left 03/27/2019   Procedure: LEFT BREAST LUMPECTOMY WITH RADIOACTIVE SEED AND LEFT AXILLARY DEEP SENTINEL LYMPH NODE BIOPSY INJECT BLUE DYE;  Surgeon: Boyce Byes, MD;  Location: [facility] OR;  Service: General;  Laterality: Left;   CARPAL TUNNEL RELEASE     BILATERAL   CHOLECYSTECTOMY     CYST REMOVED     LEFT THUMB AND RT MIDDLE FINGER   TONSILLECTOMY     TRIGGER FINGER REPAIR     UPPER GASTROINTESTINAL ENDOSCOPY      SOCIAL HISTORY:  Social History   Socioeconomic History   Marital status: Married    Spouse name: Doneisha Ivey   Number of children: 2   Years of education: GED   Highest education level: Not on file  Occupational History   Occupation: Radiographer, therapeutic: [employer]  Tobacco Use   Smoking status: Former    Current packs/day: 0.00    Average packs/day: 1 pack/day for 25.0 years (25.0 ttl pk-yrs)    Types: Cigarettes    Start date: 03/06/1983    Quit date: 03/05/2008    Years since quitting: 15.7    Passive exposure: Current (HUSBAND SMOKES)   Smokeless tobacco: Never   Tobacco comments:    03/2008  Vaping Use   Vaping status: Never Used  Substance and Sexual Activity   Alcohol use: No   Drug use: No   Sexual activity: Not Currently    Birth control/protection: Post-menopausal    Comment: hysterectomy  Other Topics Concern   Not on file  Social  History Narrative   Lives at home with her husband.   Right-handed.   Caffeine use: 20 ounces per day.   Social Drivers of Corporate investment banker Strain: Low Risk  (05/01/2019)   Received from [facility], [facility]   Overall Financial Resource Strain  (CARDIA)    Difficulty of Paying Living Expenses: Not very hard  Recent Concern: Financial Resource Strain - Medium Risk (03/06/2019)   Overall Financial Resource Strain (CARDIA)    Difficulty of Paying Living Expenses: Somewhat hard  Food Insecurity: Unknown (05/01/2019)   Received from [facility], [facility]   Hunger Vital Sign    Worried About Running Out of Food in the Last Year: Patient declined    Ran Out of Food in the Last Year: Patient declined  Transportation Needs: No Transportation Needs (05/01/2019)   Received from [facility], [facility]   PRAPARE - Transportation    Lack of Transportation (Medical): No    Lack of Transportation (Non-Medical): No  Physical Activity: Unknown (05/01/2019)   Received from [facility], [facility]   Exercise Vital Sign    Days of Exercise per Week: Patient declined    Minutes of Exercise per Session: Patient declined  Recent Concern: Physical Activity - Inactive (03/06/2019)   Exercise Vital Sign    Days of Exercise per Week: 0 days    Minutes of Exercise per Session: 0 min  Stress: No Stress Concern Present (05/01/2019)   Received from [facility], [facility] of Occupational Health - Occupational Stress Questionnaire    Feeling of Stress : Only a little  Recent Concern: Stress - Stress Concern Present (03/06/2019)   [employer] of Occupational Health - Occupational Stress Questionnaire    Feeling of Stress : To some extent  Social Connections: Unknown (10/16/2021)   Received from [facility], [facility]   Social Network    Social Network: Not on file  Intimate Partner Violence: Unknown (09/08/2021)   Received from [facility], [facility]   HITS    Physically Hurt: Not on file    Insult or Talk Down To: Not on file    Threaten Physical Harm: Not on file    Scream or Curse: Not on file    FAMILY HISTORY:  Family History  Problem Relation Age of Onset   Diabetes  Mother    Kidney disease Mother    Dementia Mother    Diabetes Father    Kidney Stones Sister    Other Sister 68       murdered   Other Sister 7       murdered   Diabetes Brother    Kidney Stones Brother    Other Brother        infant death   Kidney cancer Maternal Aunt 40   Breast cancer Maternal Aunt        dx over 69   Breast cancer Paternal Aunt 25   Breast cancer Paternal Aunt 76   Breast cancer Paternal Aunt        dx over 93   Other Daughter        infanct death   Scoliosis Son    Colon cancer Neg Hx    Colon polyps Neg Hx    Esophageal cancer Neg Hx    Ulcerative colitis Neg Hx    Stomach cancer Neg Hx    Rectal cancer Neg Hx  CURRENT MEDICATIONS:  Current Outpatient Medications  Medication Sig Dispense Refill   DULoxetine (CYMBALTA) 30 MG capsule Take 30 mg by mouth daily.     DULoxetine (CYMBALTA) 60 MG capsule Take 60 mg by mouth daily.     metFORMIN (GLUCOPHAGE-XR) 500 MG 24 hr tablet 2 tablets Orally Once a day for 90 days     nitroGLYCERIN  (NITRODUR - DOSED IN MG/24 HR) 0.2 mg/hr patch Apply 0.5 patches every day by transdermal route as directed for 90 days.     traMADol (ULTRAM) 50 MG tablet Take 1 tablet every 6 hours by oral route as needed, for moderate to severe pain.     anastrozole  (ARIMIDEX ) 1 MG tablet TAKE ONE TABLET BY MOUTH ONCE DAILY 90 tablet 1   aspirin EC 81 MG tablet Take 81 mg by mouth daily. Swallow whole.     atorvastatin (LIPITOR) 80 MG tablet Take 80 mg by mouth daily.     Blood Glucose Calibration (CONTOUR NEXT CONTROL) Normal SOLN      Blood Glucose Monitoring Suppl (CONTOUR NEXT MONITOR) w/Device KIT      CELEBREX  100 MG capsule Take 1 capsule twice a day by oral route as needed for 28 days.     cholecalciferol (VITAMIN D3) 25 MCG (1000 UT) tablet Take 2,000-4,000 Units by mouth See admin instructions. 2000 mcg daily, except once a week, take 4000 mcg daily     Continuous Glucose Receiver (DEXCOM G7 RECEIVER) DEVI USE TO MONITOR  BLOOD GLUCOSE     Continuous Glucose Sensor (DEXCOM G7 SENSOR) MISC SMARTSIG:1 Topical     cyanocobalamin  (,VITAMIN B-12,) 1000 MCG/ML injection Inject 1,000 mcg into the muscle every 30 (thirty) days. (Patient not taking: Reported on 07/04/2023)     glucose blood (CONTOUR NEXT TEST) test strip      HUMULIN R U-500 KWIKPEN 500 UNIT/ML KwikPen Inject into the skin.     hydrOXYzine (ATARAX) 10 MG tablet hydroxyzine HCl 10 mg tablet  TAKE ONE TABLET BY MOUTH EVERY 8 HOURS AS NEEDED FOR ANXIETY     ibuprofen (ADVIL) 800 MG tablet as needed.      lisinopril (PRINIVIL,ZESTRIL) 20 MG tablet Take 20 mg by mouth at bedtime.     LYRICA 75 MG capsule Take 1 capsule 3 times a day by oral route for 30 days.     pantoprazole  (PROTONIX ) 40 MG tablet Take 1 tablet (40 mg total) by mouth 2 (two) times daily. (Patient taking differently: Take 40 mg by mouth daily.) 60 tablet 11   SYRINGE-NEEDLE, DISP, 3 ML (B-D 3CC LUER-LOK SYR 25GX5/8) 25G X 5/8 3 ML MISC BD Luer-Lok Syringe 3 mL 25 x 5/8  USE TO INJECT VITAMIN B12 ONCE A MONTH.     trolamine salicylate (ASPERCREME) 10 % cream Apply 1 Application topically as needed for muscle pain.     Vibegron  75 MG TABS Take 1 tablet (75 mg total) by mouth daily. 30 tablet 5   Current Facility-Administered Medications  Medication Dose Route Frequency Provider Last Rate Last Admin   0.9 %  sodium chloride  infusion  500 mL Intravenous Continuous Pyrtle, Amber Bail, MD        ALLERGIES:  Allergies  Allergen Reactions   Celecoxib  Other (See Comments)   Exenatide     Other reaction(s): knots at injection sites for months   Percocet [Oxycodone -Acetaminophen ] Itching    PT CAN TAKE WITH BENADRYL   Acetaminophen  Rash    Other reaction(s): Other (See Comments)   Hydrocodone  Itching  PT WOULD TAKE WITH BENADRYL PT WOULD TAKE WITH BENADRYL   Oxycodone  Hcl Rash   Oxycodone -Acetaminophen  Itching    PT CAN TAKE WITH BENADRYL    LABORATORY DATA:  I have reviewed the labs  as listed.     Latest Ref Rng & Units 11/09/2023   10:29 AM 04/25/2023    8:49 AM 10/06/2022    9:32 AM  CBC  WBC 4.0 - 10.5 K/uL 5.7  5.2  6.5   Hemoglobin 12.0 - 15.0 g/dL 19.1  47.8  29.5   Hematocrit 36.0 - 46.0 % 35.9  34.5  36.0   Platelets 150 - 400 K/uL 205  175  218       Latest Ref Rng & Units 11/09/2023   10:29 AM 04/25/2023    8:49 AM 02/15/2023    4:41 PM  CMP  Glucose 70 - 99 mg/dL 621  308    BUN 8 - 23 mg/dL 15  12    Creatinine 6.57 - 1.00 mg/dL 8.46  9.62  9.52   Sodium 135 - 145 mmol/L 132  137    Potassium 3.5 - 5.1 mmol/L 4.5  3.8    Chloride 98 - 111 mmol/L 96  102    CO2 22 - 32 mmol/L 27  26    Calcium 8.9 - 10.3 mg/dL 9.5  9.3    Total Protein 6.5 - 8.1 g/dL 6.8  6.3    Total Bilirubin 0.0 - 1.2 mg/dL 0.7  0.3    Alkaline Phos 38 - 126 U/L 119  95    AST 15 - 41 U/L 15  20    ALT 0 - 44 U/L 18  25      DIAGNOSTIC IMAGING:  I have independently reviewed the scans and discussed with the patient. No results found.   WRAP UP:  All questions were answered. The patient knows to call the clinic with any problems, questions or concerns.  Medical decision making: Moderate  Time spent on visit: I spent 20 minutes counseling the patient face to face. The total time spent in the appointment was 30 minutes and more than 50% was on counseling.  Sonnie Dusky, PA-C  11/16/23 9:53 AM

## 2023-11-16 ENCOUNTER — Inpatient Hospital Stay (HOSPITAL_BASED_OUTPATIENT_CLINIC_OR_DEPARTMENT_OTHER): Payer: Medicare Other | Admitting: Physician Assistant

## 2023-11-16 VITALS — BP 121/72 | HR 89 | Temp 97.8°F | Resp 20 | Wt 214.3 lb

## 2023-11-16 DIAGNOSIS — Z1231 Encounter for screening mammogram for malignant neoplasm of breast: Secondary | ICD-10-CM | POA: Diagnosis not present

## 2023-11-16 DIAGNOSIS — E559 Vitamin D deficiency, unspecified: Secondary | ICD-10-CM

## 2023-11-16 DIAGNOSIS — C50412 Malignant neoplasm of upper-outer quadrant of left female breast: Secondary | ICD-10-CM | POA: Diagnosis not present

## 2023-11-16 DIAGNOSIS — R918 Other nonspecific abnormal finding of lung field: Secondary | ICD-10-CM | POA: Diagnosis not present

## 2023-11-16 DIAGNOSIS — Z170 Estrogen receptor positive status [ER+]: Secondary | ICD-10-CM

## 2023-11-16 DIAGNOSIS — D500 Iron deficiency anemia secondary to blood loss (chronic): Secondary | ICD-10-CM

## 2023-11-16 DIAGNOSIS — M8589 Other specified disorders of bone density and structure, multiple sites: Secondary | ICD-10-CM

## 2023-11-16 NOTE — Patient Instructions (Addendum)
 [facility] at [facility] **VISIT SUMMARY & IMPORTANT INSTRUCTIONS **   You were seen today by Sheril Dines PA-C for your follow-up visit.    HISTORY OF BREAST CANCER There were no signs of recurrent breast cancer at your appointment today. Continue to take anastrozole  daily for a total of 5 years of treatment (through November 2025) Your next mammogram will be due in September 2025.  LUNG NODULES Your most recent CT scan showed stable lung nodules without evidence of malignancy.  We will check repeat CT chest in November 2025.  IRON DEFICIENCY Your iron levels are mildly low, but you are not anemic at this time. If you feel any worsening fatigue prior to your next visit, please reach out so that we can recheck your iron and see if you would benefit from IV iron. Otherwise, continue taking your iron pill.  You can cut this back to every other day dosing to see if you tolerate it better.  OSTEOPENIA Your most recent bone density scan continues to show mild osteopenia that does not require treatment at this time. Your next bone density scan will be due in November 2026. Your vitamin D  level is improved.  Continue taking combination calcium/vitamin D  tablet once daily, in addition to vitamin D  1000 units daily.  FOLLOW-UP APPOINTMENT: Follow-up visit in 6 months  ** Thank you for trusting me with your healthcare!  I strive to provide all of my patients with quality care at each visit.  If you receive a survey for this visit, I would be so grateful to you for taking the time to provide feedback.  Thank you in advance!  ~ Dejohn Ibarra                   Dr. Paulett Boros   &   Sheril Dines, PA-C   - - - - - - - - - - - - - - - - - -    Thank you for choosing [facility] at [facility] to provide your oncology and hematology care.  To afford each patient quality time with our provider, please arrive at least 15 minutes before your  scheduled appointment time.   If you have a lab appointment with the Cancer Center please come in thru the Main Entrance and check in at the main information desk.  You need to re-schedule your appointment should you arrive 10 or more minutes late.  We strive to give you quality time with our providers, and arriving late affects you and other patients whose appointments are after yours.  Also, if you no show three or more times for appointments you may be dismissed from the clinic at the providers discretion.     Again, thank you for choosing [facility].  Our hope is that these requests will decrease the amount of time that you wait before being seen by our physicians.       _____________________________________________________________  Should you have questions after your visit to [facility], please contact our office at [phone] and follow the prompts.  Our office hours are 8:00 a.m. and 4:30 p.m. Monday - Friday.  Please note that voicemails left after 4:00 p.m. may not be returned until the following business day.  We are closed weekends and major holidays.  You do have access to a nurse 24-7, just call the main number to the clinic [phone] and do not press any options, hold  on the line and a nurse will answer the phone.    For prescription refill requests, have your pharmacy contact our office and allow 72 hours.

## 2023-11-22 ENCOUNTER — Other Ambulatory Visit: Payer: Self-pay | Admitting: Hematology

## 2023-11-22 DIAGNOSIS — Z170 Estrogen receptor positive status [ER+]: Secondary | ICD-10-CM

## 2023-12-26 ENCOUNTER — Emergency Department (HOSPITAL_COMMUNITY)
Admission: EM | Admit: 2023-12-26 | Discharge: 2023-12-26 | Disposition: A | Discharge status: Home / Self Care | Source: Ambulatory Visit | Attending: Emergency Medicine | Admitting: Emergency Medicine

## 2023-12-26 ENCOUNTER — Encounter (HOSPITAL_COMMUNITY): Payer: Self-pay | Admitting: Emergency Medicine

## 2023-12-26 ENCOUNTER — Emergency Department (HOSPITAL_COMMUNITY)

## 2023-12-26 ENCOUNTER — Other Ambulatory Visit: Payer: Self-pay

## 2023-12-26 DIAGNOSIS — Z7982 Long term (current) use of aspirin: Secondary | ICD-10-CM | POA: Insufficient documentation

## 2023-12-26 DIAGNOSIS — I509 Heart failure, unspecified: Secondary | ICD-10-CM | POA: Insufficient documentation

## 2023-12-26 DIAGNOSIS — Z853 Personal history of malignant neoplasm of breast: Secondary | ICD-10-CM | POA: Diagnosis not present

## 2023-12-26 DIAGNOSIS — Z794 Long term (current) use of insulin: Secondary | ICD-10-CM | POA: Diagnosis not present

## 2023-12-26 DIAGNOSIS — E119 Type 2 diabetes mellitus without complications: Secondary | ICD-10-CM | POA: Insufficient documentation

## 2023-12-26 DIAGNOSIS — Z79899 Other long term (current) drug therapy: Secondary | ICD-10-CM | POA: Insufficient documentation

## 2023-12-26 DIAGNOSIS — R600 Localized edema: Secondary | ICD-10-CM | POA: Diagnosis not present

## 2023-12-26 DIAGNOSIS — I110 Hypertensive heart disease with heart failure: Secondary | ICD-10-CM | POA: Diagnosis not present

## 2023-12-26 DIAGNOSIS — Z87891 Personal history of nicotine dependence: Secondary | ICD-10-CM | POA: Insufficient documentation

## 2023-12-26 DIAGNOSIS — M7989 Other specified soft tissue disorders: Secondary | ICD-10-CM | POA: Diagnosis present

## 2023-12-26 NOTE — ED Provider Notes (Signed)
 [place] EMERGENCY DEPARTMENT AT [facility] Provider Note  CSN: [id number] Arrival date & time: 12/26/23 1531  Chief Complaint(s) Leg Injury  HPI Erica Giles is a 68 y.o. female history of diabetes, hypertension, hyperlipidemia presenting to the emergency department for leg swelling.  Patient reports that she tore her Achilles tendon earlier this year in the right leg.  She has been following up with Dr. Barton.  She has had some persistent swelling in the right lower extremity and he is concerned about a DVT.  She was sent to the ER for further evaluation.  Reports that she has some persistent pain.  Has not had surgery yet.  Denies any fevers or chills, chest pain, shortness of breath.  No rash.  No abdominal pain, back pain.  No urinary changes.  Otherwise at baseline.   Past Medical History Past Medical History:  Diagnosis Date   Anxiety    Arthritis    Breast cancer (HCC)    left   Cataract    BEGINNING STAGES   CTS (carpal tunnel syndrome)    Depression    Diabetes mellitus    ORAL MED   Family history of breast cancer    GERD (gastroesophageal reflux disease)    Helicobacter pylori gastritis    Hiatal hernia    HNP (herniated nucleus pulposus), lumbar    PT TRYING TO AVOID LUMBAR SURGERY--STATES PAIN IN HER BACK AND SOMETIMES DOWN ONE OR THE OTHER LEG   Hyperlipidemia    Hypertension    Osteopenia    Personal history of radiation therapy    Plantar fasciitis    BILATERAL   Schatzki's ring    Shortness of breath    WITH EXERTION-PT RELATES TO HER WEIGHT   Sleep apnea    MODERATE- PER SLEEP STUDY 06/2010--PT USES CPAP-SETTING IS 11   Patient Active Problem List   Diagnosis Date Noted   Chest pain of uncertain etiology 07/04/2023   HTN (hypertension) 07/04/2023   Iron deficiency anemia due to chronic blood loss 10/14/2022   Multiple lung nodules on CT 09/16/2021   Neck pain 10/25/2019   DM type 2 with diabetic peripheral neuropathy (HCC)  10/25/2019   Genetic testing 07/12/2019   Family history of breast cancer    Breast cancer of upper-outer quadrant of left female breast (HCC) 03/06/2019   GERD (gastroesophageal reflux disease) 12/11/2013   Hemorrhoids 12/11/2013   DM2 (diabetes mellitus, type 2) (HCC) 12/11/2013   DDD (degenerative disc disease) 12/11/2013   HLD (hyperlipidemia) 12/11/2013   S/P cholecystectomy 12/11/2013   Home Medication(s) Prior to Admission medications   Medication Sig Start Date End Date Taking? Authorizing Provider  anastrozole  (ARIMIDEX ) 1 MG tablet TAKE 1 TABLET BY MOUTH DAILY 11/22/23   Rogers Hai, MD  aspirin EC 81 MG tablet Take 81 mg by mouth daily. Swallow whole.    [provider]  atorvastatin (LIPITOR) 80 MG tablet Take 80 mg by mouth daily. 03/30/20   [provider]  Blood Glucose Calibration (CONTOUR NEXT CONTROL) Normal SOLN     [provider]  Blood Glucose Monitoring Suppl (CONTOUR NEXT MONITOR) w/Device KIT     [provider]  CELEBREX  100 MG capsule Take 1 capsule twice a day by oral route as needed for 28 days. 03/21/23   [provider]  cholecalciferol (VITAMIN D3) 25 MCG (1000 UT) tablet Take 2,000-4,000 Units by mouth See admin instructions. 2000 mcg daily, except once a week, take 4000 mcg daily  [provider]  Continuous Glucose Receiver (DEXCOM G7 RECEIVER) DEVI USE TO MONITOR BLOOD GLUCOSE 03/22/22   [provider]  Continuous Glucose Sensor (DEXCOM G7 SENSOR) MISC SMARTSIG:1 Topical    [provider]  cyanocobalamin  (,VITAMIN B-12,) 1000 MCG/ML injection Inject 1,000 mcg into the muscle every 30 (thirty) days. Patient not taking: Reported on 07/04/2023 02/17/19   [provider]  DULoxetine (CYMBALTA) 30 MG capsule Take 30 mg by mouth daily. 11/12/23   [provider]  DULoxetine (CYMBALTA) 60 MG capsule Take 60 mg by mouth daily. 11/12/23   [provider]   glucose blood (CONTOUR NEXT TEST) test strip     [provider]  HUMULIN R U-500 KWIKPEN 500 UNIT/ML KwikPen Inject into the skin. 09/28/22   [provider]  hydrOXYzine (ATARAX) 10 MG tablet hydroxyzine HCl 10 mg tablet  TAKE ONE TABLET BY MOUTH EVERY 8 HOURS AS NEEDED FOR ANXIETY 08/04/21   [provider]  ibuprofen (ADVIL) 800 MG tablet as needed.     [provider]  lisinopril (PRINIVIL,ZESTRIL) 20 MG tablet Take 20 mg by mouth at bedtime.    [provider]  LYRICA 75 MG capsule Take 1 capsule 3 times a day by oral route for 30 days. 10/14/22   [provider]  metFORMIN (GLUCOPHAGE-XR) 500 MG 24 hr tablet 2 tablets Orally Once a day for 90 days 08/17/23   [provider]  nitroGLYCERIN  (NITRODUR - DOSED IN MG/24 HR) 0.2 mg/hr patch Apply 0.5 patches every day by transdermal route as directed for 90 days. 08/01/23   [provider]  pantoprazole  (PROTONIX ) 40 MG tablet Take 1 tablet (40 mg total) by mouth 2 (two) times daily. Patient taking differently: Take 40 mg by mouth daily. 02/22/14   Pyrtle, Gordy HERO, MD  SYRINGE-NEEDLE, DISP, 3 ML (B-D 3CC LUER-LOK SYR 25GX5/8) 25G X 5/8 3 ML MISC BD Luer-Lok Syringe 3 mL 25 x 5/8  USE TO INJECT VITAMIN B12 ONCE A MONTH.    [provider]  traMADol (ULTRAM) 50 MG tablet Take 1 tablet every 6 hours by oral route as needed, for moderate to severe pain. 11/05/23   [provider]  trolamine salicylate (ASPERCREME) 10 % cream Apply 1 Application topically as needed for muscle pain.    [provider]  Vibegron  75 MG TABS Take 1 tablet (75 mg total) by mouth daily. 04/28/23   Zuleta, Kaitlin G, NP                                                                                                                                    Past Surgical History Past Surgical History:  Procedure Laterality Date   ABDOMINAL HYSTERECTOMY     BREAST LUMPECTOMY     BREAST  LUMPECTOMY WITH RADIOACTIVE SEED AND SENTINEL LYMPH NODE BIOPSY Left 03/27/2019   Procedure: LEFT BREAST LUMPECTOMY WITH RADIOACTIVE SEED AND  LEFT AXILLARY DEEP SENTINEL LYMPH NODE BIOPSY INJECT BLUE DYE;  Surgeon: Gail Favorite, MD;  Location: [facility] OR;  Service: General;  Laterality: Left;   CARPAL TUNNEL RELEASE     BILATERAL   CHOLECYSTECTOMY     CYST REMOVED     LEFT THUMB AND RT MIDDLE FINGER   TONSILLECTOMY     TRIGGER FINGER REPAIR     UPPER GASTROINTESTINAL ENDOSCOPY     Family History Family History  Problem Relation Age of Onset   Diabetes Mother    Kidney disease Mother    Dementia Mother    Diabetes Father    Kidney Stones Sister    Other Sister 56       murdered   Other Sister 7       murdered   Diabetes Brother    Kidney Stones Brother    Other Brother        infant death   Kidney cancer Maternal Aunt 22   Breast cancer Maternal Aunt        dx over 24   Breast cancer Paternal Aunt 73   Breast cancer Paternal Aunt 35   Breast cancer Paternal Aunt        dx over 7   Other Daughter        infanct death   Scoliosis Son    Colon cancer Neg Hx    Colon polyps Neg Hx    Esophageal cancer Neg Hx    Ulcerative colitis Neg Hx    Stomach cancer Neg Hx    Rectal cancer Neg Hx     Social History Social History   Tobacco Use   Smoking status: Former    Current packs/day: 0.00    Average packs/day: 1 pack/day for 25.0 years (25.0 ttl pk-yrs)    Types: Cigarettes    Start date: 03/06/1983    Quit date: 03/05/2008    Years since quitting: 15.8    Passive exposure: Current (HUSBAND SMOKES)   Smokeless tobacco: Never   Tobacco comments:    03/2008  Vaping Use   Vaping status: Never Used  Substance Use Topics   Alcohol use: No   Drug use: No   Allergies Celecoxib , Exenatide, Percocet [oxycodone -acetaminophen ], Acetaminophen , Hydrocodone , Oxycodone  hcl, and Oxycodone -acetaminophen   Review of Systems Review of Systems  All other systems reviewed and are  negative.   Physical Exam Vital Signs  I have reviewed the triage vital signs BP (!) 159/74 (BP Location: Right Arm)   Pulse 79   Temp 98.5 F (36.9 C) (Oral)   Resp 16   Ht 5' 5 (1.651 m)   Wt 99.8 kg   LMP  (LMP Unknown)   SpO2 97%   BMI 36.61 kg/m  Physical Exam Vitals and nursing note reviewed.  Constitutional:      General: She is not in acute distress.    Appearance: She is well-developed.  HENT:     Head: Normocephalic and atraumatic.     Mouth/Throat:     Mouth: Mucous membranes are moist.  Eyes:     Pupils: Pupils are equal, round, and reactive to light.  Cardiovascular:     Rate and Rhythm: Normal rate and regular rhythm.     Heart sounds: No murmur heard. Pulmonary:     Effort: Pulmonary effort is normal. No respiratory distress.     Breath sounds: Normal breath sounds.  Abdominal:     General: Abdomen is flat.     Palpations: Abdomen is soft.  Tenderness: There is no abdominal tenderness.  Musculoskeletal:        General: No tenderness.     Comments: Trace left and 1+ right lower extremity edema.  No rashes.  No significant tenderness.  Skin:    General: Skin is warm and dry.  Neurological:     General: No focal deficit present.     Mental Status: She is alert. Mental status is at baseline.  Psychiatric:        Mood and Affect: Mood normal.        Behavior: Behavior normal.     ED Results and Treatments Labs (all labs ordered are listed, but only abnormal results are displayed) Labs Reviewed - No data to display                                                                                                                        Radiology US  Venous Img Lower Right (DVT Study) Result Date: 12/26/2023 CLINICAL DATA:  Edema. RIGHT lower extremity swelling. Status post Achilles tendon tear EXAM: RIGHT LOWER EXTREMITY VENOUS DOPPLER ULTRASOUND TECHNIQUE: Gray-scale sonography with compression, as well as color and duplex ultrasound, were performed  to evaluate the deep venous system(s) from the level of the common femoral vein through the popliteal and proximal calf veins. COMPARISON:  None Available. FINDINGS: VENOUS Normal compressibility of the common femoral, superficial femoral, and popliteal veins, as well as the visualized calf veins. Visualized portions of profunda femoral vein and great saphenous vein unremarkable. No filling defects to suggest DVT on grayscale or color Doppler imaging. Doppler waveforms show normal direction of venous flow, normal respiratory plasticity and response to augmentation. Limited views of the contralateral common femoral vein are unremarkable. OTHER None. Limitations: none IMPRESSION: No evidence of deep venous thrombosis. Electronically Signed   By: Jackquline Boxer M.D.   On: 12/26/2023 16:51    Pertinent labs & imaging results that were available during my care of the patient were reviewed by me and considered in my medical decision making (see MDM for details).  Medications Ordered in ED Medications - No data to display                                                                                                                                   Procedures Procedures  (including critical care time)  Medical Decision Making / ED Course   MDM:  68 year old  presenting to the emergency department with leg swelling.  On exam, patient has swelling to the right lower extremity and some slight swelling to the left.  Suspect most likely cause is injury related with possible component of venous insufficiency given she does have some swelling in the left leg also.  DVT ultrasound was obtained and negative.  Patient denies other concerning symptoms.  Considered other process such as renal dysfunction, CHF, hepatic failure but history of physical exam not consistent with these diagnoses.  The patient is stable for discharge to home.  Discussed elevation of legs and compression stockings.  Recommended continued  follow-up with her orthopedic surgeon.  Will discharge patient to home. All questions answered. Patient comfortable with plan of discharge. Return precautions discussed with patient and specified on the after visit summary.     Social Determinants of Health:  Diagnosis or treatment significantly limited by social determinants of health: obesity   Co morbidities that complicate the patient evaluation  Past Medical History:  Diagnosis Date   Anxiety    Arthritis    Breast cancer (HCC)    left   Cataract    BEGINNING STAGES   CTS (carpal tunnel syndrome)    Depression    Diabetes mellitus    ORAL MED   Family history of breast cancer    GERD (gastroesophageal reflux disease)    Helicobacter pylori gastritis    Hiatal hernia    HNP (herniated nucleus pulposus), lumbar    PT TRYING TO AVOID LUMBAR SURGERY--STATES PAIN IN HER BACK AND SOMETIMES DOWN ONE OR THE OTHER LEG   Hyperlipidemia    Hypertension    Osteopenia    Personal history of radiation therapy    Plantar fasciitis    BILATERAL   Schatzki's ring    Shortness of breath    WITH EXERTION-PT RELATES TO HER WEIGHT   Sleep apnea    MODERATE- PER SLEEP STUDY 06/2010--PT USES CPAP-SETTING IS 11      Dispostion: Disposition decision including need for hospitalization was considered, and patient discharged from emergency department.    Final Clinical Impression(s) / ED Diagnoses Final diagnoses:  Peripheral edema     This chart was dictated using voice recognition software.  Despite best efforts to proofread,  errors can occur which can change the documentation meaning.    Francesca Elsie CROME, MD 12/26/23 [phone]

## 2023-12-26 NOTE — Discharge Instructions (Addendum)
 We evaluated you for your leg swelling.  Your testing in the emergency department did not show any sign of a DVT (blood clot).  We suspect that your swelling is partially due to your Achilles injury which can cause swelling to the leg.  Your left leg also has a small amount of swelling so you may also have some degree of venous insufficiency.  We feel that it is safe to go home at this time.  Please be sure to keep your legs elevated.  Please wear compression stockings as well to help push fluid to the rest of your body.

## 2023-12-26 NOTE — ED Notes (Signed)
 Pt called to be roomed and she is not in the waiting room

## 2023-12-26 NOTE — ED Triage Notes (Signed)
 Pt to the ED with a prior torn achilles tendon. Pt was sent here by the treating physician to rule out a blood clot due to the increased swelling.

## 2024-01-19 ENCOUNTER — Other Ambulatory Visit: Payer: Self-pay | Admitting: *Deleted

## 2024-03-12 ENCOUNTER — Encounter (HOSPITAL_COMMUNITY): Payer: Self-pay

## 2024-03-12 ENCOUNTER — Ambulatory Visit (HOSPITAL_COMMUNITY)
Admission: RE | Admit: 2024-03-12 | Discharge: 2024-03-12 | Disposition: A | Discharge status: Home / Self Care | Source: Ambulatory Visit | Attending: Physician Assistant | Admitting: Physician Assistant

## 2024-03-12 DIAGNOSIS — Z1231 Encounter for screening mammogram for malignant neoplasm of breast: Secondary | ICD-10-CM | POA: Insufficient documentation

## 2024-03-12 DIAGNOSIS — Z853 Personal history of malignant neoplasm of breast: Secondary | ICD-10-CM | POA: Diagnosis not present

## 2024-03-12 DIAGNOSIS — C50412 Malignant neoplasm of upper-outer quadrant of left female breast: Secondary | ICD-10-CM | POA: Diagnosis present

## 2024-03-12 DIAGNOSIS — Z170 Estrogen receptor positive status [ER+]: Secondary | ICD-10-CM | POA: Diagnosis present

## 2024-03-12 DIAGNOSIS — R928 Other abnormal and inconclusive findings on diagnostic imaging of breast: Secondary | ICD-10-CM | POA: Insufficient documentation

## 2024-03-14 ENCOUNTER — Other Ambulatory Visit (HOSPITAL_COMMUNITY): Payer: Self-pay | Admitting: Physician Assistant

## 2024-03-14 DIAGNOSIS — R928 Other abnormal and inconclusive findings on diagnostic imaging of breast: Secondary | ICD-10-CM

## 2024-03-22 ENCOUNTER — Ambulatory Visit (HOSPITAL_COMMUNITY)
Admission: RE | Admit: 2024-03-22 | Discharge: 2024-03-22 | Disposition: A | Discharge status: Home / Self Care | Source: Ambulatory Visit | Attending: Physician Assistant | Admitting: Physician Assistant

## 2024-03-22 DIAGNOSIS — R928 Other abnormal and inconclusive findings on diagnostic imaging of breast: Secondary | ICD-10-CM

## 2024-04-20 ENCOUNTER — Ambulatory Visit (HOSPITAL_COMMUNITY)
Admission: RE | Admit: 2024-04-20 | Discharge: 2024-04-20 | Disposition: A | Discharge status: Home / Self Care | Source: Ambulatory Visit | Attending: Physician Assistant | Admitting: Physician Assistant

## 2024-04-20 DIAGNOSIS — R918 Other nonspecific abnormal finding of lung field: Secondary | ICD-10-CM | POA: Diagnosis present

## 2024-04-23 ENCOUNTER — Ambulatory Visit: Payer: Self-pay | Admitting: Physician Assistant

## 2024-05-02 ENCOUNTER — Other Ambulatory Visit: Payer: Self-pay

## 2024-05-02 ENCOUNTER — Ambulatory Visit: Attending: Vascular Surgery | Admitting: Vascular Surgery

## 2024-05-02 ENCOUNTER — Ambulatory Visit (HOSPITAL_COMMUNITY)
Admission: RE | Admit: 2024-05-02 | Discharge: 2024-05-02 | Disposition: A | Discharge status: Home / Self Care | Source: Ambulatory Visit | Attending: Vascular Surgery | Admitting: Vascular Surgery

## 2024-05-02 VITALS — BP 130/75 | HR 80 | Temp 98.3°F | Ht 65.0 in | Wt 224.0 lb

## 2024-05-02 DIAGNOSIS — I739 Peripheral vascular disease, unspecified: Secondary | ICD-10-CM | POA: Insufficient documentation

## 2024-05-02 LAB — VAS US ABI WITH/WO TBI
Left ABI: 0.79
Right ABI: 0.79

## 2024-05-02 NOTE — Progress Notes (Signed)
 Patient ID: Erica Giles, female   DOB: [birth year], 68 y.o.   MRN: [id number]  Reason for Consult: New Patient (Initial Visit)   Referred by Practice, [facility]  Subjective:     HPI:  Erica Giles is a 68 y.o. female has history of back pain more recently notable pain in her right buttock with activity levels.  She states that after she walks several steps that she has to stop due to pain in her right buttock.  She does have back pain if she stands for long periods of time and also right hip pain when she is seated.  She does not have any tissue loss or ulceration.  She is a former smoker but quit over 10 years ago does take aspirin and high-dose statin and her lipids are followed by her primary care provider.  She denies lower extremity rest pain or tissue loss and does not have calf claudication.  Past Medical History:  Diagnosis Date   Anxiety    Arthritis    Breast cancer (HCC)    left   Cataract    BEGINNING STAGES   CTS (carpal tunnel syndrome)    Depression    Diabetes mellitus    ORAL MED   Family history of breast cancer    GERD (gastroesophageal reflux disease)    Helicobacter pylori gastritis    Hiatal hernia    HNP (herniated nucleus pulposus), lumbar    PT TRYING TO AVOID LUMBAR SURGERY--STATES PAIN IN HER BACK AND SOMETIMES DOWN ONE OR THE OTHER LEG   Hyperlipidemia    Hypertension    Osteopenia    Personal history of radiation therapy    Plantar fasciitis    BILATERAL   Schatzki's ring    Shortness of breath    WITH EXERTION-PT RELATES TO HER WEIGHT   Sleep apnea    MODERATE- PER SLEEP STUDY 06/2010--PT USES CPAP-SETTING IS 11   Family History  Problem Relation Age of Onset   Diabetes Mother    Kidney disease Mother    Dementia Mother    Diabetes Father    Kidney Stones Sister    Other Sister 62       murdered   Other Sister 7       murdered   Diabetes Brother    Kidney Stones Brother    Other Brother        infant death   Kidney cancer  Maternal Aunt 61   Breast cancer Maternal Aunt        dx over 13   Breast cancer Paternal Aunt 16   Breast cancer Paternal Aunt 6   Breast cancer Paternal Aunt        dx over 8   Other Daughter        infanct death   Scoliosis Son    Colon cancer Neg Hx    Colon polyps Neg Hx    Esophageal cancer Neg Hx    Ulcerative colitis Neg Hx    Stomach cancer Neg Hx    Rectal cancer Neg Hx    Past Surgical History:  Procedure Laterality Date   ABDOMINAL HYSTERECTOMY     BREAST LUMPECTOMY     BREAST LUMPECTOMY WITH RADIOACTIVE SEED AND SENTINEL LYMPH NODE BIOPSY Left 03/27/2019   Procedure: LEFT BREAST LUMPECTOMY WITH RADIOACTIVE SEED AND LEFT AXILLARY DEEP SENTINEL LYMPH NODE BIOPSY INJECT BLUE DYE;  Surgeon: Gail Favorite, MD;  Location: [facility] OR;  Service: General;  Laterality: Left;  CARPAL TUNNEL RELEASE     BILATERAL   CHOLECYSTECTOMY     CYST REMOVED     LEFT THUMB AND RT MIDDLE FINGER   TONSILLECTOMY     TRIGGER FINGER REPAIR     UPPER GASTROINTESTINAL ENDOSCOPY      Short Social History:  Social History   Tobacco Use   Smoking status: Former    Current packs/day: 0.00    Average packs/day: 1 pack/day for 25.0 years (25.0 ttl pk-yrs)    Types: Cigarettes    Start date: 03/06/1983    Quit date: 03/05/2008    Years since quitting: 16.1    Passive exposure: Current (HUSBAND SMOKES)   Smokeless tobacco: Never   Tobacco comments:    03/2008  Substance Use Topics   Alcohol use: No    Allergies  Allergen Reactions   Celecoxib  Other (See Comments)   Exenatide     Other reaction(s): knots at injection sites for months   Percocet [Oxycodone -Acetaminophen ] Itching    PT CAN TAKE WITH BENADRYL   Acetaminophen  Rash    Other reaction(s): Other (See Comments)   Hydrocodone  Itching    PT WOULD TAKE WITH BENADRYL PT WOULD TAKE WITH BENADRYL   Oxycodone  Hcl Rash   Oxycodone -Acetaminophen  Itching    PT CAN TAKE WITH BENADRYL    Current Outpatient Medications   Medication Sig Dispense Refill   anastrozole  (ARIMIDEX ) 1 MG tablet TAKE 1 TABLET BY MOUTH DAILY 90 tablet 1   aspirin EC 81 MG tablet Take 81 mg by mouth daily. Swallow whole.     atorvastatin (LIPITOR) 80 MG tablet Take 80 mg by mouth daily.     Blood Glucose Calibration (CONTOUR NEXT CONTROL) Normal SOLN      Blood Glucose Monitoring Suppl (CONTOUR NEXT MONITOR) w/Device KIT      CELEBREX  100 MG capsule Take 1 capsule twice a day by oral route as needed for 28 days.     cholecalciferol (VITAMIN D3) 25 MCG (1000 UT) tablet Take 2,000-4,000 Units by mouth See admin instructions. 2000 mcg daily, except once a week, take 4000 mcg daily     Continuous Glucose Receiver (DEXCOM G7 RECEIVER) DEVI USE TO MONITOR BLOOD GLUCOSE     Continuous Glucose Sensor (DEXCOM G7 SENSOR) MISC SMARTSIG:1 Topical     cyanocobalamin  (,VITAMIN B-12,) 1000 MCG/ML injection Inject 1,000 mcg into the muscle every 30 (thirty) days.     DULoxetine (CYMBALTA) 30 MG capsule Take 30 mg by mouth daily.     DULoxetine (CYMBALTA) 60 MG capsule Take 60 mg by mouth daily.     glucose blood (CONTOUR NEXT TEST) test strip      HUMULIN R U-500 KWIKPEN 500 UNIT/ML KwikPen Inject into the skin.     hydrOXYzine (ATARAX) 10 MG tablet hydroxyzine HCl 10 mg tablet  TAKE ONE TABLET BY MOUTH EVERY 8 HOURS AS NEEDED FOR ANXIETY     ibuprofen (ADVIL) 800 MG tablet as needed.      lisinopril (PRINIVIL,ZESTRIL) 20 MG tablet Take 20 mg by mouth at bedtime.     LYRICA 75 MG capsule Take 1 capsule 3 times a day by oral route for 30 days.     metFORMIN (GLUCOPHAGE-XR) 500 MG 24 hr tablet 2 tablets Orally Once a day for 90 days     nitroGLYCERIN  (NITRODUR - DOSED IN MG/24 HR) 0.2 mg/hr patch Apply 0.5 patches every day by transdermal route as directed for 90 days.     pantoprazole  (PROTONIX ) 40 MG tablet  Take 1 tablet (40 mg total) by mouth 2 (two) times daily. (Patient taking differently: Take 40 mg by mouth daily.) 60 tablet 11   SYRINGE-NEEDLE,  DISP, 3 ML (B-D 3CC LUER-LOK SYR 25GX5/8) 25G X 5/8 3 ML MISC BD Luer-Lok Syringe 3 mL 25 x 5/8  USE TO INJECT VITAMIN B12 ONCE A MONTH.     traMADol (ULTRAM) 50 MG tablet Take 1 tablet every 6 hours by oral route as needed, for moderate to severe pain.     trolamine salicylate (ASPERCREME) 10 % cream Apply 1 Application topically as needed for muscle pain.     Vibegron  75 MG TABS Take 1 tablet (75 mg total) by mouth daily. 30 tablet 5   Current Facility-Administered Medications  Medication Dose Route Frequency Provider Last Rate Last Admin   0.9 %  sodium chloride  infusion  500 mL Intravenous Continuous Pyrtle, Gordy HERO, MD        Review of Systems  Constitutional:  Constitutional negative. HENT: HENT negative.  Eyes: Eyes negative.  Cardiovascular: Positive for claudication and leg swelling.  GI: Gastrointestinal negative.  Musculoskeletal: Positive for back pain, gait problem, leg pain and joint pain.  Hematologic: Hematologic/lymphatic negative.  Psychiatric: Psychiatric negative.        Objective:  Objective   Vitals:   05/02/24 1143  BP: 130/75  Pulse: 80  Temp: 98.3 F (36.8 C)  SpO2: 97%  Weight: 224 lb (101.6 kg)  Height: 5' 5 (1.651 m)   Body mass index is 37.28 kg/m.  Physical Exam HENT:     Head: Normocephalic.     Nose: Nose normal.  Eyes:     Pupils: Pupils are equal, round, and reactive to light.  Cardiovascular:     Pulses:          Femoral pulses are 1+ on the right side and 2+ on the left side.      Dorsalis pedis pulses are 1+ on the right side and 0 on the left side.       Posterior tibial pulses are 0 on the right side and 0 on the left side.  Pulmonary:     Effort: Pulmonary effort is normal.  Abdominal:     General: Abdomen is flat.     Palpations: Abdomen is soft. There is no mass.  Musculoskeletal:        General: Normal range of motion.     Right lower leg: Edema present.     Left lower leg: Edema present.  Skin:    General: Skin  is warm.     Capillary Refill: Capillary refill takes less than 2 seconds.  Neurological:     General: No focal deficit present.     Mental Status: She is alert. Mental status is at baseline.  Psychiatric:        Mood and Affect: Mood normal.     Data: ABI Findings:  +---------+------------------+-----+--------+--------+  Right   Rt Pressure (mmHg)IndexWaveformComment   +---------+------------------+-----+--------+--------+  Brachial 154                                      +---------+------------------+-----+--------+--------+  PTA     121               0.75 biphasic          +---------+------------------+-----+--------+--------+  DP      128  0.79 biphasic          +---------+------------------+-----+--------+--------+  Great Toe96                0.59                   +---------+------------------+-----+--------+--------+   +---------+------------------+-----+--------+-------+  Left    Lt Pressure (mmHg)IndexWaveformComment  +---------+------------------+-----+--------+-------+  Brachial 162                                     +---------+------------------+-----+--------+-------+  PTA     128               0.79 biphasic         +---------+------------------+-----+--------+-------+  DP      114               0.70 biphasic         +---------+------------------+-----+--------+-------+  Great Toe110               0.68                  +---------+------------------+-----+--------+-------+   +-------+-----------+-----------+------------+------------+  ABI/TBIToday's ABIToday's TBIPrevious ABIPrevious TBI  +-------+-----------+-----------+------------+------------+  Right 0.79       0.59                                 +-------+-----------+-----------+------------+------------+  Left  0.79       0.68                                  +-------+-----------+-----------+------------+------------+           Summary:  Right: Resting right ankle-brachial index indicates moderate right lower  extremity arterial disease.  Right toe pressure is >60 mmHg which suggests adequate perfusion for  healing.    Left: Resting left ankle-brachial index indicates moderate left lower  extremity arterial disease.  Left toe pressure is >60 mmHg which suggests adequate perfusion for  healing.      Assessment/Plan:     67 year old female with likely multifactorial pain of her right back, hip and buttock.  She has a decreased femoral pulse on the right relative to the left but I can weakly feel the right dorsalis pedis pulse.  Given her history of smoking she more than likely has aortoiliac stenosis but how much this contributes to her buttock claudication I am unsure.  We discussed her options and she has agreed to proceed with CT angio of the abdomen and pelvis with runoff bilaterally for further evaluation.  We discussed that any blockage that appears to be leading to her symptoms would require additional procedures possibly endovascular versus open.  We also discussed that her pain is likely multifactorial secondary to both musculoskeletal and neurogenic causes.  I will have her follow-up after CTA and we can discuss options moving forward.  All questions were answered she demonstrates good understanding.     Penne Lonni Colorado MD [facility]

## 2024-05-07 ENCOUNTER — Other Ambulatory Visit: Payer: Self-pay

## 2024-05-07 DIAGNOSIS — I739 Peripheral vascular disease, unspecified: Secondary | ICD-10-CM

## 2024-05-11 ENCOUNTER — Encounter (HOSPITAL_COMMUNITY): Payer: Self-pay

## 2024-05-11 ENCOUNTER — Ambulatory Visit (HOSPITAL_COMMUNITY): Admission: RE | Admit: 2024-05-11 | Discharge: 2024-05-11 | Attending: Vascular Surgery

## 2024-05-11 DIAGNOSIS — I739 Peripheral vascular disease, unspecified: Secondary | ICD-10-CM

## 2024-05-11 LAB — POCT I-STAT CREATININE: Creatinine, Ser: 0.9 mg/dL (ref 0.44–1.00)

## 2024-05-11 MED ORDER — IOHEXOL 350 MG/ML SOLN
100.0000 mL | Freq: Once | INTRAVENOUS | Status: AC | PRN
  Administered 2024-05-11: 100 mL via INTRAVENOUS

## 2024-05-16 ENCOUNTER — Inpatient Hospital Stay: Attending: Physician Assistant

## 2024-05-16 DIAGNOSIS — M8589 Other specified disorders of bone density and structure, multiple sites: Secondary | ICD-10-CM

## 2024-05-16 DIAGNOSIS — E559 Vitamin D deficiency, unspecified: Secondary | ICD-10-CM

## 2024-05-16 DIAGNOSIS — Z79811 Long term (current) use of aromatase inhibitors: Secondary | ICD-10-CM | POA: Insufficient documentation

## 2024-05-16 DIAGNOSIS — C50412 Malignant neoplasm of upper-outer quadrant of left female breast: Secondary | ICD-10-CM | POA: Insufficient documentation

## 2024-05-16 DIAGNOSIS — D509 Iron deficiency anemia, unspecified: Secondary | ICD-10-CM | POA: Insufficient documentation

## 2024-05-16 DIAGNOSIS — M8580 Other specified disorders of bone density and structure, unspecified site: Secondary | ICD-10-CM | POA: Insufficient documentation

## 2024-05-16 DIAGNOSIS — Z923 Personal history of irradiation: Secondary | ICD-10-CM | POA: Insufficient documentation

## 2024-05-16 DIAGNOSIS — Z1231 Encounter for screening mammogram for malignant neoplasm of breast: Secondary | ICD-10-CM

## 2024-05-16 DIAGNOSIS — Z803 Family history of malignant neoplasm of breast: Secondary | ICD-10-CM | POA: Diagnosis not present

## 2024-05-16 DIAGNOSIS — Z79899 Other long term (current) drug therapy: Secondary | ICD-10-CM | POA: Insufficient documentation

## 2024-05-16 DIAGNOSIS — Z1721 Progesterone receptor positive status: Secondary | ICD-10-CM | POA: Insufficient documentation

## 2024-05-16 DIAGNOSIS — Z1732 Human epidermal growth factor receptor 2 negative status: Secondary | ICD-10-CM | POA: Diagnosis not present

## 2024-05-16 DIAGNOSIS — Z87891 Personal history of nicotine dependence: Secondary | ICD-10-CM | POA: Diagnosis not present

## 2024-05-16 DIAGNOSIS — R918 Other nonspecific abnormal finding of lung field: Secondary | ICD-10-CM | POA: Insufficient documentation

## 2024-05-16 DIAGNOSIS — Z170 Estrogen receptor positive status [ER+]: Secondary | ICD-10-CM | POA: Diagnosis not present

## 2024-05-16 DIAGNOSIS — D500 Iron deficiency anemia secondary to blood loss (chronic): Secondary | ICD-10-CM

## 2024-05-16 DIAGNOSIS — Z8051 Family history of malignant neoplasm of kidney: Secondary | ICD-10-CM | POA: Diagnosis not present

## 2024-05-16 LAB — CBC WITH DIFFERENTIAL/PLATELET
Abs Immature Granulocytes: 0.03 K/uL (ref 0.00–0.07)
Basophils Absolute: 0 K/uL (ref 0.0–0.1)
Basophils Relative: 0 %
Eosinophils Absolute: 0.1 K/uL (ref 0.0–0.5)
Eosinophils Relative: 1 %
HCT: 38.5 % (ref 36.0–46.0)
Hemoglobin: 12.7 g/dL (ref 12.0–15.0)
Immature Granulocytes: 0 %
Lymphocytes Relative: 25 %
Lymphs Abs: 1.7 K/uL (ref 0.7–4.0)
MCH: 29.4 pg (ref 26.0–34.0)
MCHC: 33 g/dL (ref 30.0–36.0)
MCV: 89.1 fL (ref 80.0–100.0)
Monocytes Absolute: 0.5 K/uL (ref 0.1–1.0)
Monocytes Relative: 7 %
Neutro Abs: 4.5 K/uL (ref 1.7–7.7)
Neutrophils Relative %: 67 %
Platelets: 229 K/uL (ref 150–400)
RBC: 4.32 MIL/uL (ref 3.87–5.11)
RDW: 14.2 % (ref 11.5–15.5)
WBC: 6.8 K/uL (ref 4.0–10.5)
nRBC: 0 % (ref 0.0–0.2)

## 2024-05-16 LAB — COMPREHENSIVE METABOLIC PANEL WITH GFR
ALT: 21 U/L (ref 0–44)
AST: 21 U/L (ref 15–41)
Albumin: 4.3 g/dL (ref 3.5–5.0)
Alkaline Phosphatase: 148 U/L — ABNORMAL HIGH (ref 38–126)
Anion gap: 15 (ref 5–15)
BUN: 21 mg/dL (ref 8–23)
CO2: 24 mmol/L (ref 22–32)
Calcium: 9.6 mg/dL (ref 8.9–10.3)
Chloride: 95 mmol/L — ABNORMAL LOW (ref 98–111)
Creatinine, Ser: 0.95 mg/dL (ref 0.44–1.00)
GFR, Estimated: >60 mL/min (ref >60)
Glucose, Bld: 343 mg/dL — ABNORMAL HIGH (ref 70–99)
Potassium: 4.2 mmol/L (ref 3.5–5.1)
Sodium: 134 mmol/L — ABNORMAL LOW (ref 135–145)
Total Bilirubin: 0.4 mg/dL (ref 0.0–1.2)
Total Protein: 7.2 g/dL (ref 6.5–8.1)

## 2024-05-16 LAB — IRON AND TIBC
Iron: 72 ug/dL (ref 28–170)
Saturation Ratios: 19 % (ref 10.4–31.8)
TIBC: 385 ug/dL (ref 250–450)
UIBC: 313 ug/dL

## 2024-05-16 LAB — VITAMIN B12: Vitamin B-12: 429 pg/mL (ref 180–914)

## 2024-05-16 LAB — VITAMIN D 25 HYDROXY (VIT D DEFICIENCY, FRACTURES): Vit D, 25-Hydroxy: 40.89 ng/mL (ref 30–100)

## 2024-05-16 LAB — FERRITIN: Ferritin: 51 ng/mL (ref 11–307)

## 2024-05-21 LAB — METHYLMALONIC ACID, SERUM: Methylmalonic Acid, Quantitative: 278 nmol/L (ref 0–378)

## 2024-05-22 NOTE — Progress Notes (Unsigned)
 [facility] [address], [place] [address]   CLINIC:  Medical Oncology/Hematology  PCP:  Practice, [facility] [address] [place] / [place] [id number] [phone]   REASON FOR VISIT:  Follow-up for lung nodules with history of breast cancer   PRIOR THERAPY:  Left lumpectomy on 03/27/2019 XRT completed on 07/18/2019   NGS Results: Not done   CURRENT THERAPY: Anastrozole   BRIEF ONCOLOGIC HISTORY:   Oncology History  Breast cancer of upper-outer quadrant of left female breast (HCC)  03/06/2019 Initial Diagnosis   Breast cancer of upper-outer quadrant of left female breast (HCC)   04/24/2019 Cancer Staging   Staging form: Breast, AJCC 8th Edition - Clinical: Stage IB (cT1c, cN1, cM0, G2, ER+, PR+, HER2-) - Signed by Rogers Hai, MD on 04/24/2019   07/11/2019 Genetic Testing   Negative genetic testing on the common hereditary cancer panel.  The Common Hereditary Gene Panel offered by [facility] includes sequencing and/or deletion duplication testing of the following 48 genes: APC, ATM, AXIN2, BARD1, BMPR1A, BRCA1, BRCA2, BRIP1, CDH1, CDK4, CDKN2A (p14ARF), CDKN2A (p16INK4a), CHEK2, CTNNA1, DICER1, EPCAM (Deletion/duplication testing only), GREM1 (promoter region deletion/duplication testing only), KIT, MEN1, MLH1, MSH2, MSH3, MSH6, MUTYH, NBN, NF1, NHTL1, PALB2, PDGFRA, PMS2, POLD1, POLE, PTEN, RAD50, RAD51C, RAD51D, RNF43, SDHB, SDHC, SDHD, SMAD4, SMARCA4. STK11, TP53, TSC1, TSC2, and VHL.  The following genes were evaluated for sequence changes only: SDHA and HOXB13 c.251G>A variant only. The report date is July 11, 2019.        CANCER STAGING:  Cancer Staging  Breast cancer of upper-outer quadrant of left female breast (HCC) Staging form: Breast, AJCC 8th Edition - Clinical: Stage IB (cT1c, cN1, cM0, G2, ER+, PR+, HER2-) - Signed by Rogers Hai, MD on 04/24/2019   INTERVAL HISTORY:   Ms. Shahed C Loving, a 68 y.o. female, returns for  routine follow-up of her lung nodules and history of breast cancer. Edelyn was last seen on 11/16/2023 by Pleasant Barefoot PA-C.    In the interim since last visit, she has been undergoing workup for leg pain, back pain, and peripheral artery diease; following with both spinal specialist and vascular specialist. She has been dealing with stress related to some health issues in her husband, Elsie, as well.  At today's visit, she  reports feeling fair. She reports 50% energy and 100% appetite.   She is maintaining stable weight at this time.  She has not noticed any new breast lumps or axillary lymphadenopathy.    She reports back and leg pain as described above, as well as some chronic arthritic pain.  No new headaches or abdominal pain.  She continues to take Arimidex  daily and is tolerating this fairly well, apart from some mild hot flashes and night sweats, which are tolerable. She had been taking pregabalin for her neuropathy, but did not refill it this month and is planning to discuss further with her pain management specialist since her swelling has improved after stopping pregabalin.     She ran out of her iron, calcium , and vitamin D  about 3 months ago. She denies any rectal bleeding or melena. She has ongoing fatigue.  ASSESSMENT & PLAN:  1.  Stage Ib (T1CN1) left breast IDC: - Initial diagnosis September 2020, with a screening mammogram (02/16/2019) showing suspicious 0.9 cm mass in the 230 to 3 o'clock position of the left breast - Ultrasound-guided biopsy (02/20/2019): Grade 2 invasive ductal carcinoma, positive for ER/PR and negative for HER2. - Lumpectomy and  sentinel lymph node biopsy by Dr. Gail on 03/17/2019.  Pathology showed grade 2 IDC, 1.1 cm, negative margins, negative LVSI/perineural invasion, 1/1 lymph node positive for metastatic carcinoma measuring 2.7 mm. PT1CPN1A.  ER 100%, PR 100%, Ki-67 2%, HER2 2+, negative by FISH.  - Oncotype DX results showed recurrence  score of 8.  No apparent benefit from chemotherapy.  9-year breast cancer specific survival is 98%. - Completed radiation therapy on 07/18/2019 - She started anastrozole  on 04/24/2019.  She is continuing anastrozole  without any major problems apart from some increased arthralgia. - BCI testing (10/21/2022) shows no benefit from extended endocrine therapy.  Risk of late distant recurrence is 8.3% regardless of 5 versus 10 years of adjuvant endocrine therapy.  Goal of anastrozole  is 5 years, to be completed in November 2025.   - Most recent screening mammogram (03/12/2024) was BI-RADS Category 0, incomplete, due to possible symmetry in right breast as well as possible asymmetry and calcifications in left breast. - Diagnostic mammogram (03/22/2024): BI-RADS Category 3, probably benign 1.4 cm group of coarse, early dystrophic type calcifications in upper outer LEFT breast, at middle depth.  Recommendation for LEFT diagnostic mammogram with magnification views in 6 months. - Physical examination today (05/23/2024) with some firm tissue noted in the upper outer left breast, correlating with mammographic abnormality.  No masses or abnormalities in right breast.  No axillary lymphadenopathy bilaterally.   - No red flag symptoms of recurrence. - Most recent labs (05/16/2024): Overall unremarkable CBC.  Mild elevation in alkaline phosphatase at 148 (prior elevation at 130 in 2021, otherwise alk phos has been normal).   No recent liver imaging on file. She has been on atorvastatin for several years. - PLAN: Patient completed 5 years of treatment with anastrozole  as of November 2025.  She can discontinue anastrozole  at this time.   - Recheck LFTs (liver function panel) as well as GGT in 1 month.  If alkaline phosphatase remains elevated, we will proceed with liver imaging and/or bone scan, based on results of GGT.   - Diagnostic mammogram of left breast due 09/21/2024  (Depending on those results, screening mammogram  will be due 03/13/2025) - Repeat labs and RTC in 6 months for office visit and physical exam.     2.  Bilateral lung nodules: - She had a fall on 08/22/2021 with left lower rib fractures. - CT chest with contrast (09/10/2021): Multiple bilateral lung nodules, largest irregular nodule of the posterior inferior right upper lobe measuring 1.7 x 1.0 cm, 0.4 cm nodule in the left apex, right lower lobe 0.4 cm lung nodule. - PET scan (10/01/2021) did not show uptake in the 1.7 cm irregular nodular lesion in the right upper lobe.  No lymphadenopathy. - CT chest with contrast (04/12/2022): Stable 1.7 cm multilobular nodule in the posterior right upper lobe.  Other scattered tiny bilateral lung nodules are unchanged when compared to scan from 09/10/2021.  No mediastinal or hilar adenopathy. - Follow-up CT chest (04/25/2023): Stable bilateral pulmonary nodules, most pronounced within right upper lobe (1.5 x 0.8 cm).  Other small pulmonary nodules as described.  No new pulmonary nodules or masses. - Most recent CT chest (04/20/2024):  Stable lobulated RUL pulmonary nodule measuring 7 x 12 mm, safely considered benign.   Stable scattered 2 to 3 mm pulmonary nodules in RLL, LUL, and LLL, safely considered benign. New 3 mm 2 mm RML nodules, can consider repeat chest CT at 12 months.   - Denies any dyspnea, cough, hemoptysis,  or voice changes.  No B-symptoms or unintentional weight loss. - PLAN: Due to her underlying risk factors, we will plan on follow-up imaging with repeat low-dose CT chest for lung nodule follow-up in November 2026  3.  Iron deficiency anemia, mild  - Labs from 10/06/2022 showed Hgb 11.6/MCV 93.8, ferritin 27, iron saturation 19% - EGD/colonoscopy (05/12/2022): Colonoscopy revealed multiple polyps (tubular adenoma) and internal hemorrhoids.  Endoscopy revealed esophageal inflammation, Schatzki ring, 3 cm hiatal hernia. - No rectal bleeding or melena. - Mild fatigue, but with energy improved after IV  iron infusions - She reports severe constipation when taking ferrous sulfate.  Stopped taking 3 months ago. - Received Feraheme x 2 in May 2024. - Most recent labs (05/16/2024): Hgb 12.7/MCV 89.1, ferritin 51, iron saturation 19%.  Normal B12 429, normal MMA. - PLAN: Although ferritin is <100, patient would like to hold off on IV iron at this time. - Recommend trial of ferrous bisglycinate on MWF to see if this is better tolerated - Repeat CBC, B12, MMA, iron panel at follow-up in 6 months.   4.  Osteopenia: - DEXA scan on 04/19/2019 shows T score of -1. - DEXA scan on 04/20/2021 shows T score of -1.3, mild osteopenia - Most recent DEXA scan (04/25/2023): T-score -1.4, stable mild osteopenia  - Started on Fosamax  in September 2022, but patient stopped taking this in 2023.   - Most recent vitamin D  (11/09/2023) normalized at 44.55.  Normal calcium  9.5. - She was taking combination calcium /vitamin D  as well as vitamin D  1000 units daily, but ran out about 3 months ago. - PLAN: Rx to pharmacy to restart combination calcium /vitamin D  + vitamin D  1000 units daily. - Continue weightbearing exercises for bone loss associated with breast cancer therapy. - Next DEXA scan due November 2026.  If any significant worsening, will consider reintroducing bisphosphonates/Prolia.  5.  Other history: - She works at [employer]  She quit smoking in 2009 after smoking 1 PPD x 20 years. - Paternal aunts x 3 had breast cancer.  Maternal aunt with breast cancer.  [facility] germline mutation testing was negative.  PLAN SUMMARY:  >> Discontinue anastrozole  >> Labs only in 1 month = liver function panel, GGT  >> Diagnostic mammogram of left breast due 09/21/2024 >> Labs in 6 months = CBC/D, CMP, vitamin D , ferritin, iron/TIBC, B12, MMA >> OFFICE visit in 6 months (1 week after labs)  AFTER NEXT OFFICE VISIT: - Screening mammogram October 2026 - Lung nodule follow-up low-dose CT chest November 2026 - Possible discharge  from clinic in 1 year (December 2026), if overall clinical stable and no new concerns x 1 year     REVIEW OF SYSTEMS:   Review of Systems  Constitutional:  Positive for fatigue (mild). Negative for appetite change, chills, diaphoresis, fever and unexpected weight change.  HENT:   Positive for trouble swallowing. Negative for lump/mass and nosebleeds.   Eyes:  Negative for eye problems.  Respiratory:  Negative for cough, hemoptysis and shortness of breath (with exertion).   Cardiovascular:  Negative for chest pain (Intermittent, none today), leg swelling and palpitations.  Gastrointestinal:  Negative for abdominal pain, blood in stool, constipation, diarrhea, nausea and vomiting.  Genitourinary:  Negative for bladder incontinence and hematuria.   Musculoskeletal:  Positive for arthralgias.  Skin: Negative.   Neurological:  Negative for dizziness, headaches, light-headedness and numbness (Neuropathy).  Hematological:  Does not bruise/bleed easily.  Psychiatric/Behavioral:  Positive for depression. The patient is nervous/anxious.  PHYSICAL EXAM:   Performance status (ECOG): 1 - Symptomatic but completely ambulatory  Vitals:   05/23/24 1039 05/23/24 1042  BP: (!) 150/59 (!) 133/59  Pulse: 80   Resp: 18   Temp: 97.9 F (36.6 C)   SpO2: 99%      Wt Readings from Last 3 Encounters:  05/23/24 226 lb (102.5 kg)  05/02/24 224 lb (101.6 kg)  12/26/23 220 lb (99.8 kg)   Physical Exam Constitutional:      Appearance: Normal appearance. She is obese.  Cardiovascular:     Rate and Rhythm: Normal rate and regular rhythm.     Pulses: Normal pulses.     Heart sounds: Normal heart sounds.  Pulmonary:     Effort: Pulmonary effort is normal.     Breath sounds: Normal breath sounds.  Chest:  Breasts:    Right: Normal.     Left: No swelling, inverted nipple, mass, nipple discharge, skin change or tenderness.       Comments: Firm tissue noted in the upper outer left breast, near  lumpectomy scar, correlating with mammographic abnormality.  No masses or abnormalities in right breast.  No axillary lymphadenopathy bilaterally.  Musculoskeletal:     Comments: 1+ edema of left calf/pretibial region. 1+ edema of right foot.   Lymphadenopathy:     Upper Body:     Right upper body: No supraclavicular, axillary or pectoral adenopathy.     Left upper body: No supraclavicular, axillary or pectoral adenopathy.  Skin:    General: Skin is warm and dry.  Neurological:     General: No focal deficit present.     Mental Status: She is alert and oriented to person, place, and time.  Psychiatric:        Mood and Affect: Mood normal.        Behavior: Behavior normal.     PAST MEDICAL/SURGICAL HISTORY:  Past Medical History:  Diagnosis Date   Anxiety    Arthritis    Breast cancer (HCC)    left   Cataract    BEGINNING STAGES   CTS (carpal tunnel syndrome)    Depression    Diabetes mellitus    ORAL MED   Family history of breast cancer    GERD (gastroesophageal reflux disease)    Helicobacter pylori gastritis    Hiatal hernia    HNP (herniated nucleus pulposus), lumbar    PT TRYING TO AVOID LUMBAR SURGERY--STATES PAIN IN HER BACK AND SOMETIMES DOWN ONE OR THE OTHER LEG   Hyperlipidemia    Hypertension    Osteopenia    Personal history of radiation therapy    Plantar fasciitis    BILATERAL   Schatzki's ring    Shortness of breath    WITH EXERTION-PT RELATES TO HER WEIGHT   Sleep apnea    MODERATE- PER SLEEP STUDY 06/2010--PT USES CPAP-SETTING IS 11   Past Surgical History:  Procedure Laterality Date   ABDOMINAL HYSTERECTOMY     BREAST LUMPECTOMY     BREAST LUMPECTOMY WITH RADIOACTIVE SEED AND SENTINEL LYMPH NODE BIOPSY Left 03/27/2019   Procedure: LEFT BREAST LUMPECTOMY WITH RADIOACTIVE SEED AND LEFT AXILLARY DEEP SENTINEL LYMPH NODE BIOPSY INJECT BLUE DYE;  Surgeon: Gail Favorite, MD;  Location: [facility] OR;  Service: General;  Laterality: Left;   CARPAL TUNNEL  RELEASE     BILATERAL   CHOLECYSTECTOMY     CYST REMOVED     LEFT THUMB AND RT MIDDLE FINGER   TONSILLECTOMY  TRIGGER FINGER REPAIR     UPPER GASTROINTESTINAL ENDOSCOPY      SOCIAL HISTORY:  Social History   Socioeconomic History   Marital status: Married    Spouse name: Diandra Cimini   Number of children: 2   Years of education: GED   Highest education level: Not on file  Occupational History   Occupation: Radiographer, Therapeutic: [employer]  Tobacco Use   Smoking status: Former    Current packs/day: 0.00    Average packs/day: 1 pack/day for 25.0 years (25.0 ttl pk-yrs)    Types: Cigarettes    Start date: 03/06/1983    Quit date: 03/05/2008    Years since quitting: 16.2    Passive exposure: Current (HUSBAND SMOKES)   Smokeless tobacco: Never   Tobacco comments:    03/2008  Vaping Use   Vaping status: Never Used  Substance and Sexual Activity   Alcohol use: No   Drug use: No   Sexual activity: Not Currently    Birth control/protection: Post-menopausal    Comment: hysterectomy  Other Topics Concern   Not on file  Social History Narrative   Lives at home with her husband.   Right-handed.   Caffeine use: 20 ounces per day.   Social Drivers of Health   Tobacco Use: Medium Risk (12/26/2023)   Patient History    Smoking Tobacco Use: Former    Smokeless Tobacco Use: Never    Passive Exposure: Current  Programmer, Applications: Not on file  Food Insecurity: Not on file  Transportation Needs: Not on file  Physical Activity: Not on file  Stress: Not on file  Social Connections: Unknown (10/16/2021)   Received from [facility]   Social Network    Social Network: Not on file  Intimate Partner Violence: Unknown (09/08/2021)   Received from [facility]   HITS    Physically Hurt: Not on file    Insult or Talk Down To: Not on file    Threaten Physical Harm: Not on file    Scream or Curse: Not on file  Depression (PHQ2-9): Low Risk (05/23/2024)    Depression (PHQ2-9)    PHQ-2 Score: 0  Alcohol Screen: Not on file  Housing: Not on file  Utilities: Not on file  Health Literacy: Not on file    FAMILY HISTORY:  Family History  Problem Relation Age of Onset   Diabetes Mother    Kidney disease Mother    Dementia Mother    Diabetes Father    Kidney Stones Sister    Other Sister 58       murdered   Other Sister 7       murdered   Diabetes Brother    Kidney Stones Brother    Other Brother        infant death   Kidney cancer Maternal Aunt 65   Breast cancer Maternal Aunt        dx over 82   Breast cancer Paternal Aunt 38   Breast cancer Paternal Aunt 21   Breast cancer Paternal Aunt        dx over 69   Other Daughter        infanct death   Scoliosis Son    Colon cancer Neg Hx    Colon polyps Neg Hx    Esophageal cancer Neg Hx    Ulcerative colitis Neg Hx    Stomach cancer Neg Hx    Rectal cancer Neg Hx  CURRENT MEDICATIONS:  Current Outpatient Medications  Medication Sig Dispense Refill   Accu-Chek Softclix Lancets lancets Use to check blood sugar, fingerstick; Duration: 100 days Three times a day, DX: E11.65     calcium -vitamin D  (OSCAL WITH D) 500-5 MG-MCG tablet Take 1 tablet by mouth daily with breakfast. 90 tablet 3   cholecalciferol (VITAMIN D3) 25 MCG (1000 UNIT) tablet Take 1 tablet (1,000 Units total) by mouth daily. 90 tablet 3   cyclobenzaprine  (FLEXERIL ) 10 MG tablet Take 1 tablet every 6-8 hours by oral route as needed for spasm for 7 days.     hydrochlorothiazide (HYDRODIURIL) 25 MG tablet 1 tablet in the morning Orally Once a day; Duration: 30 days     Insulin Pen Needle (BD PEN NEEDLE NANO 2ND GEN) 32G X 4 MM MISC Twice a day . Once a day; Duration: 90 days     oxyCODONE -acetaminophen  (PERCOCET/ROXICET) 5-325 MG tablet 1-2 tablets, by mouth, every 4-6 hours as needed for pain. NOT TO EXCEED 6 tablets a day.     aspirin EC 81 MG tablet Take 81 mg by mouth daily. Swallow whole.     atorvastatin  (LIPITOR) 80 MG tablet Take 80 mg by mouth daily.     Blood Glucose Calibration (CONTOUR NEXT CONTROL) Normal SOLN      Blood Glucose Monitoring Suppl (CONTOUR NEXT MONITOR) w/Device KIT      CELEBREX  100 MG capsule Take 1 capsule twice a day by oral route as needed for 28 days.     Continuous Glucose Receiver (DEXCOM G7 RECEIVER) DEVI USE TO MONITOR BLOOD GLUCOSE     Continuous Glucose Sensor (DEXCOM G7 SENSOR) MISC SMARTSIG:1 Topical     cyanocobalamin  (,VITAMIN B-12,) 1000 MCG/ML injection Inject 1,000 mcg into the muscle every 30 (thirty) days.     DULoxetine (CYMBALTA) 30 MG capsule Take 30 mg by mouth daily.     DULoxetine (CYMBALTA) 60 MG capsule Take 60 mg by mouth daily.     glucose blood (CONTOUR NEXT TEST) test strip      HUMULIN R U-500 KWIKPEN 500 UNIT/ML KwikPen Inject into the skin.     hydrOXYzine (ATARAX) 10 MG tablet hydroxyzine HCl 10 mg tablet  TAKE ONE TABLET BY MOUTH EVERY 8 HOURS AS NEEDED FOR ANXIETY     ibuprofen (ADVIL) 800 MG tablet as needed.      lisinopril (PRINIVIL,ZESTRIL) 20 MG tablet Take 20 mg by mouth at bedtime.     LYRICA 75 MG capsule Take 1 capsule 3 times a day by oral route for 30 days.     metFORMIN (GLUCOPHAGE-XR) 500 MG 24 hr tablet 2 tablets Orally Once a day for 90 days     nitroGLYCERIN  (NITRODUR - DOSED IN MG/24 HR) 0.2 mg/hr patch Apply 0.5 patches every day by transdermal route as directed for 90 days.     pantoprazole  (PROTONIX ) 40 MG tablet Take 1 tablet (40 mg total) by mouth 2 (two) times daily. (Patient taking differently: Take 40 mg by mouth daily.) 60 tablet 11   SYRINGE-NEEDLE, DISP, 3 ML (B-D 3CC LUER-LOK SYR 25GX5/8) 25G X 5/8 3 ML MISC BD Luer-Lok Syringe 3 mL 25 x 5/8  USE TO INJECT VITAMIN B12 ONCE A MONTH.     traMADol (ULTRAM) 50 MG tablet Take 1 tablet every 6 hours by oral route as needed, for moderate to severe pain.     trolamine salicylate (ASPERCREME) 10 % cream Apply 1 Application topically as needed for muscle pain.  Vibegron  75 MG TABS Take 1 tablet (75 mg total) by mouth daily. 30 tablet 5   Current Facility-Administered Medications  Medication Dose Route Frequency Provider Last Rate Last Admin   0.9 %  sodium chloride  infusion  500 mL Intravenous Continuous Pyrtle, Gordy HERO, MD        ALLERGIES:  Allergies  Allergen Reactions   Celecoxib  Other (See Comments)   Exenatide     Other reaction(s): knots at injection sites for months   Percocet [Oxycodone -Acetaminophen ] Itching    PT CAN TAKE WITH BENADRYL   Acetaminophen  Rash    Other reaction(s): Other (See Comments)   Hydrocodone  Itching    PT WOULD TAKE WITH BENADRYL PT WOULD TAKE WITH BENADRYL   Oxycodone  Hcl Rash   Oxycodone -Acetaminophen  Itching    PT CAN TAKE WITH BENADRYL    LABORATORY DATA:  I have reviewed the labs as listed.     Latest Ref Rng & Units 05/16/2024   10:17 AM 11/09/2023   10:29 AM 04/25/2023    8:49 AM  CBC  WBC 4.0 - 10.5 K/uL 6.8  5.7  5.2   Hemoglobin 12.0 - 15.0 g/dL 87.2  87.9  88.4   Hematocrit 36.0 - 46.0 % 38.5  35.9  34.5   Platelets 150 - 400 K/uL 229  205  175       Latest Ref Rng & Units 05/16/2024   10:17 AM 05/11/2024    9:05 AM 11/09/2023   10:29 AM  CMP  Glucose 70 - 99 mg/dL 656   617   BUN 8 - 23 mg/dL 21   15   Creatinine 9.55 - 1.00 mg/dL 9.04  9.09  9.10   Sodium 135 - 145 mmol/L 134   132   Potassium 3.5 - 5.1 mmol/L 4.2   4.5   Chloride 98 - 111 mmol/L 95   96   CO2 22 - 32 mmol/L 24   27   Calcium  8.9 - 10.3 mg/dL 9.6   9.5   Total Protein 6.5 - 8.1 g/dL 7.2   6.8   Total Bilirubin 0.0 - 1.2 mg/dL 0.4   0.7   Alkaline Phos 38 - 126 U/L 148   119   AST 15 - 41 U/L 21   15   ALT 0 - 44 U/L 21   18     DIAGNOSTIC IMAGING:  I have independently reviewed the scans and discussed with the patient. CT ANGIO AO+BIFEM W & OR WO CONTRAST Result Date: 05/13/2024 CLINICAL DATA:  Peripheral arterial disease EXAM: CT ANGIOGRAPHY OF ABDOMINAL AORTA WITH ILIOFEMORAL RUNOFF TECHNIQUE:  Multidetector CT imaging of the abdomen, pelvis and lower extremities was performed using the standard protocol during bolus administration of intravenous contrast. Multiplanar CT image reconstructions and MIPs were obtained to evaluate the vascular anatomy. RADIATION DOSE REDUCTION: This exam was performed according to the departmental dose-optimization program which includes automated exposure control, adjustment of the mA and/or kV according to patient size and/or use of iterative reconstruction technique. CONTRAST:  OMNIPAQUE  IOHEXOL  350 MG/ML SOLN COMPARISON:  None Available. FINDINGS: VASCULAR Aorta: Mild diffuse atherosclerotic changes are present throughout the abdominal aorta. In the infrarenal segment, the lumen is small with minimal luminal diameter estimated at 5 mm. Celiac: Patent. SMA: Patent. Renals: Patent. IMA: Patent. RIGHT Lower Extremity Inflow: Right common iliac artery demonstrates mild diffuse atherosclerotic changes with minimal luminal diameter estimated at 4.5 mm. The right internal iliac artery is patent. The right external iliac artery  is patent. Outflow: Patent femoral and popliteal segments Runoff: Patent three-vessel tibial runoff. LEFT Lower Extremity Inflow: Mild atherosclerotic changes in the left common iliac artery with minimal luminal diameter estimated at 6 mm. Moderate disease is present in the proximal left internal iliac artery. The left external iliac artery is patent. Outflow: Patent femoral and popliteal segments. Runoff: The posterior tibial artery is not significantly opacified. There is peroneal continuation of the PT distally. The anterior tibial artery is patent. Veins: No obvious venous abnormality within the limitations of this arterial phase study. Review of the MIP images confirms the above findings. NON-VASCULAR Lower chest: Mosaic attenuation of the lungs. The previously observed right upper lobe pulmonary nodules are partially captured. Hepatobiliary: No  focal liver abnormality is seen. Status post cholecystectomy. No biliary dilatation. Pancreas: Unremarkable. No pancreatic ductal dilatation or surrounding inflammatory changes. Spleen: Normal in size without focal abnormality. Adrenals/Urinary Tract: Adrenal glands are unremarkable. Kidneys are normal, without renal calculi, focal lesion, or hydronephrosis. Bladder is unremarkable. Stomach/Bowel: Stomach is nondilated. Small hiatal hernia. No dilated loops of small bowel are appreciated. A moderate volume of stool material is present in the colon and there are short segments of mild gaseous distension. Lymphatic: No evidence of significant lymphadenopathy. Reproductive: Status post hysterectomy. No adnexal masses. Other: Edema is present in the lower extremities bilaterally. Musculoskeletal: Degenerative changes are present in the imaged osseous structures. IMPRESSION: 1. Mild diffuse atherosclerotic changes with small lumen in the infrarenal segment (minimal luminal diameter estimated at 5 mm). 2. Right lower extremity: Mild inflow disease. Patent femoral, popliteal, and tibial segments. 3. Left lower extremity: Mild inflow disease. Patent femoral and popliteal segments. Two vessel runoff via the anterior tibial artery and peroneal arteries. Electronically Signed   By: Maude Naegeli M.D.   On: 05/13/2024 09:18   VAS US  ABI WITH/WO TBI Result Date: 05/02/2024  LOWER EXTREMITY DOPPLER STUDY Patient Name:  Erica Giles  Date of Exam:   05/02/2024 Medical Rec #: [id number]     Accession #:    [id number] Date of Birth: 09-09-1955      Patient Gender: F Patient Age:   19 years Exam Location:  [address] Procedure:      VAS US  ABI WITH/WO TBI Referring Phys: PENNE COLORADO --------------------------------------------------------------------------------  Indications: Patient reports right leg claudication that begins in the back and              radiates to the hip upon immediate ambulation. High Risk Factors:  Hypertension, Diabetes.  Performing Technologist: Geni Lodge RVS, RCS  Examination Guidelines: A complete evaluation includes at minimum, Doppler waveform signals and systolic blood pressure reading at the level of bilateral brachial, anterior tibial, and posterior tibial arteries, when vessel segments are accessible. Bilateral testing is considered an integral part of a complete examination. Photoelectric Plethysmograph (PPG) waveforms and toe systolic pressure readings are included as required and additional duplex testing as needed. Limited examinations for reoccurring indications may be performed as noted.  ABI Findings: +---------+------------------+-----+--------+--------+ Right    Rt Pressure (mmHg)IndexWaveformComment  +---------+------------------+-----+--------+--------+ Brachial 154                                     +---------+------------------+-----+--------+--------+ PTA      121               0.75 biphasic         +---------+------------------+-----+--------+--------+ DP  128               0.79 biphasic         +---------+------------------+-----+--------+--------+ Great Toe96                0.59                  +---------+------------------+-----+--------+--------+ +---------+------------------+-----+--------+-------+ Left     Lt Pressure (mmHg)IndexWaveformComment +---------+------------------+-----+--------+-------+ Brachial 162                                    +---------+------------------+-----+--------+-------+ PTA      128               0.79 biphasic        +---------+------------------+-----+--------+-------+ DP       114               0.70 biphasic        +---------+------------------+-----+--------+-------+ Great Toe110               0.68                 +---------+------------------+-----+--------+-------+ +-------+-----------+-----------+------------+------------+ ABI/TBIToday's ABIToday's TBIPrevious  ABIPrevious TBI +-------+-----------+-----------+------------+------------+ Right  0.79       0.59                                +-------+-----------+-----------+------------+------------+ Left   0.79       0.68                                +-------+-----------+-----------+------------+------------+   Summary: Right: Resting right ankle-brachial index indicates moderate right lower extremity arterial disease. Right toe pressure is >60 mmHg which suggests adequate perfusion for healing.  Left: Resting left ankle-brachial index indicates moderate left lower extremity arterial disease. Left toe pressure is >60 mmHg which suggests adequate perfusion for healing. *See table(s) above for measurements and observations.  Electronically signed by Penne Colorado MD on 05/02/2024 at 11:12:25 AM.    Final      WRAP UP:  All questions were answered. The patient knows to call the clinic with any problems, questions or concerns.  Medical decision making: Moderate  Time spent on visit: I spent 20 minutes counseling the patient face to face. The total time spent in the appointment was 30 minutes and more than 50% was on counseling.  Pleasant CHRISTELLA Barefoot, PA-C  05/23/2024 12:11 PM

## 2024-05-23 ENCOUNTER — Other Ambulatory Visit (HOSPITAL_COMMUNITY): Payer: Self-pay | Admitting: Physician Assistant

## 2024-05-23 ENCOUNTER — Inpatient Hospital Stay: Admitting: Physician Assistant

## 2024-05-23 VITALS — BP 133/59 | HR 80 | Temp 97.9°F | Resp 18 | Ht 65.5 in | Wt 226.0 lb

## 2024-05-23 DIAGNOSIS — M8589 Other specified disorders of bone density and structure, multiple sites: Secondary | ICD-10-CM | POA: Diagnosis not present

## 2024-05-23 DIAGNOSIS — R748 Abnormal levels of other serum enzymes: Secondary | ICD-10-CM

## 2024-05-23 DIAGNOSIS — Z170 Estrogen receptor positive status [ER+]: Secondary | ICD-10-CM | POA: Diagnosis not present

## 2024-05-23 DIAGNOSIS — R918 Other nonspecific abnormal finding of lung field: Secondary | ICD-10-CM

## 2024-05-23 DIAGNOSIS — C50412 Malignant neoplasm of upper-outer quadrant of left female breast: Secondary | ICD-10-CM | POA: Diagnosis not present

## 2024-05-23 DIAGNOSIS — R921 Mammographic calcification found on diagnostic imaging of breast: Secondary | ICD-10-CM

## 2024-05-23 DIAGNOSIS — Z1231 Encounter for screening mammogram for malignant neoplasm of breast: Secondary | ICD-10-CM

## 2024-05-23 DIAGNOSIS — D500 Iron deficiency anemia secondary to blood loss (chronic): Secondary | ICD-10-CM | POA: Diagnosis not present

## 2024-05-23 DIAGNOSIS — E559 Vitamin D deficiency, unspecified: Secondary | ICD-10-CM

## 2024-05-23 MED ORDER — OYSTER SHELL CALCIUM/D3 500-5 MG-MCG PO TABS: 1.0000 | ORAL_TABLET | Freq: Every day | ORAL | 3 refills | Status: AC

## 2024-05-23 MED ORDER — VITAMIN D 25 MCG (1000 UNIT) PO TABS: 1000.0000 [IU] | ORAL_TABLET | Freq: Every day | ORAL | 3 refills | Status: AC

## 2024-05-23 NOTE — Patient Instructions (Addendum)
 [facility] at [facility] **VISIT SUMMARY & IMPORTANT INSTRUCTIONS **   You were seen today by Pleasant Barefoot PA-C for your follow-up visit.    HISTORY OF BREAST CANCER Congratulations!  You have completed your 5 years of treatment with anastrozole .  You do NOT need to take any more anastrozole  treatment at this time. There were no signs of recurrent breast cancer at your appointment today. Since you had an abnormal (likely benign) mammogram in October 2025, we will check another mammogram of your left breast in April 2026. We will see you for follow-up visit in 6 months.  ABNORMAL LAB TESTS One of your lab test showed elevated alkaline phosphatase.  This could be related to liver, bones, or medications. We will recheck your alkaline phosphatase along with other lab test in 1 month.  Depending on these results, we will check other tests after that.  LUNG NODULES Your most recent CT scan showed stable lung nodules which are benign.  However, you did have a few new lung nodules (small and without aggressive features), so we will check repeat CT chest in November 2026.  IRON DEFICIENCY Your iron levels are mildly low, but you are not anemic at this time. If you feel any worsening fatigue prior to your next visit, please reach out so that we can recheck your iron and see if you would benefit from IV iron. Start taking FERROUS BISGLYCINATE.  This iron supplement is available over-the-counter, but may be easier to find online.  Take this on Monday, Wednesday, and Friday.  Hopefully this will improve your iron levels without causing any worsening constipation.  OSTEOPENIA Your most recent bone density scan continues to show mild osteopenia that does not require treatment at this time. Your next bone density scan will be due in November 2026. Your vitamin D  level is improved.  Continue taking combination calcium /vitamin D  tablet once daily, in addition to vitamin D  1000  units daily.  FOLLOW-UP APPOINTMENT: Follow-up visit in 6 months  ** Thank you for trusting me with your healthcare!  I strive to provide all of my patients with quality care at each visit.  If you receive a survey for this visit, I would be so grateful to you for taking the time to provide feedback.  Thank you in advance!  ~ Kenden Brandt                                        Dr. Mickiel Davonna Pleasant Barefoot, PA-C       Delon Hope, NP   - - - - - - - - - - - - - - - - - -     Thank you for choosing [facility] at [facility] to provide your oncology and hematology care.  To afford each patient quality time with our provider, please arrive at least 15 minutes before your scheduled appointment time.   If you have a lab appointment with the Cancer Center please come in thru the Main Entrance and check in at the main information desk.  You need to re-schedule your appointment should you arrive 10 or more minutes late.  We strive to give you quality time with our providers, and arriving late affects you and other patients whose appointments are after yours.  Also, if you no show three or more times for  appointments you may be dismissed from the clinic at the providers discretion.     Again, thank you for choosing [facility].  Our hope is that these requests will decrease the amount of time that you wait before being seen by our physicians.       _____________________________________________________________  Should you have questions after your visit to [facility], please contact our office at [phone] and follow the prompts.  Our office hours are 8:00 a.m. and 4:30 p.m. Monday - Friday.  Please note that voicemails left after 4:00 p.m. may not be returned until the following business day.  We are closed weekends and major holidays.  You do have access to a nurse 24-7, just call the main number to the clinic [phone] and do not press  any options, hold on the line and a nurse will answer the phone.    For prescription refill requests, have your pharmacy contact our office and allow 72 hours.

## 2024-06-04 ENCOUNTER — Encounter: Payer: Self-pay | Admitting: *Deleted

## 2024-06-13 ENCOUNTER — Encounter: Payer: Self-pay | Admitting: Vascular Surgery

## 2024-06-13 ENCOUNTER — Ambulatory Visit: Attending: Vascular Surgery | Admitting: Vascular Surgery

## 2024-06-13 VITALS — BP 144/74 | HR 82 | Temp 98.2°F | Ht 65.0 in | Wt 227.0 lb

## 2024-06-13 DIAGNOSIS — I739 Peripheral vascular disease, unspecified: Secondary | ICD-10-CM | POA: Insufficient documentation

## 2024-06-13 NOTE — Progress Notes (Signed)
 "  Patient ID: Erica Giles, female   DOB: 11-May-1956, 69 y.o.   MRN: [id number]  Reason for Consult: Follow-up   Referred by Practice, [facility]  Subjective:     HPI:  Erica Giles is a 69 y.o. female recently here for multiple factorial right back hip and buttock pain with decreased femoral pulses although a palpable dorsalis pedis on the right.  She has now undergone CT angio and is here for further follow-up.  She continues with same symptoms today.  She states that her blood sugar has not been well-controlled.  She remains on baby aspirin and a statin at the direction of her primary care doctor.  She is a former smoker quit over 15 years ago.  Past Medical History:  Diagnosis Date   Anxiety    Arthritis    Breast cancer (HCC)    left   Cataract    BEGINNING STAGES   CTS (carpal tunnel syndrome)    Depression    Diabetes mellitus    ORAL MED   Family history of breast cancer    GERD (gastroesophageal reflux disease)    Helicobacter pylori gastritis    Hiatal hernia    HNP (herniated nucleus pulposus), lumbar    PT TRYING TO AVOID LUMBAR SURGERY--STATES PAIN IN HER BACK AND SOMETIMES DOWN ONE OR THE OTHER LEG   Hyperlipidemia    Hypertension    Osteopenia    Personal history of radiation therapy    Plantar fasciitis    BILATERAL   Schatzki's ring    Shortness of breath    WITH EXERTION-PT RELATES TO HER WEIGHT   Sleep apnea    MODERATE- PER SLEEP STUDY 06/2010--PT USES CPAP-SETTING IS 11   Family History  Problem Relation Age of Onset   Diabetes Mother    Kidney disease Mother    Dementia Mother    Diabetes Father    Kidney Stones Sister    Other Sister 59       murdered   Other Sister 7       murdered   Diabetes Brother    Kidney Stones Brother    Other Brother        infant death   Kidney cancer Maternal Aunt 46   Breast cancer Maternal Aunt        dx over 44   Breast cancer Paternal Aunt 56   Breast cancer Paternal Aunt 30   Breast cancer  Paternal Aunt        dx over 86   Other Daughter        infanct death   Scoliosis Son    Colon cancer Neg Hx    Colon polyps Neg Hx    Esophageal cancer Neg Hx    Ulcerative colitis Neg Hx    Stomach cancer Neg Hx    Rectal cancer Neg Hx    Past Surgical History:  Procedure Laterality Date   ABDOMINAL HYSTERECTOMY     BREAST LUMPECTOMY     BREAST LUMPECTOMY WITH RADIOACTIVE SEED AND SENTINEL LYMPH NODE BIOPSY Left 03/27/2019   Procedure: LEFT BREAST LUMPECTOMY WITH RADIOACTIVE SEED AND LEFT AXILLARY DEEP SENTINEL LYMPH NODE BIOPSY INJECT BLUE DYE;  Surgeon: Gail Favorite, MD;  Location: [facility] OR;  Service: General;  Laterality: Left;   CARPAL TUNNEL RELEASE     BILATERAL   CHOLECYSTECTOMY     CYST REMOVED     LEFT THUMB AND RT MIDDLE FINGER   TONSILLECTOMY  TRIGGER FINGER REPAIR     UPPER GASTROINTESTINAL ENDOSCOPY      Short Social History:  Social History   Tobacco Use   Smoking status: Former    Current packs/day: 0.00    Average packs/day: 1 pack/day for 25.0 years (25.0 ttl pk-yrs)    Types: Cigarettes    Start date: 03/06/1983    Quit date: 03/05/2008    Years since quitting: 16.2    Passive exposure: Current (HUSBAND SMOKES)   Smokeless tobacco: Never   Tobacco comments:    03/2008  Substance Use Topics   Alcohol use: No    Allergies[1]  Current Outpatient Medications  Medication Sig Dispense Refill   Accu-Chek Softclix Lancets lancets Use to check blood sugar, fingerstick; Duration: 100 days Three times a day, DX: E11.65     aspirin EC 81 MG tablet Take 81 mg by mouth daily. Swallow whole.     atorvastatin (LIPITOR) 80 MG tablet Take 80 mg by mouth daily.     Blood Glucose Calibration (CONTOUR NEXT CONTROL) Normal SOLN      Blood Glucose Monitoring Suppl (CONTOUR NEXT MONITOR) w/Device KIT      calcium -vitamin D  (OSCAL WITH D) 500-5 MG-MCG tablet Take 1 tablet by mouth daily with breakfast. 90 tablet 3   CELEBREX  100 MG capsule Take 1 capsule twice a  day by oral route as needed for 28 days.     cholecalciferol (VITAMIN D3) 25 MCG (1000 UNIT) tablet Take 1 tablet (1,000 Units total) by mouth daily. 90 tablet 3   Continuous Glucose Receiver (DEXCOM G7 RECEIVER) DEVI USE TO MONITOR BLOOD GLUCOSE     Continuous Glucose Sensor (DEXCOM G7 SENSOR) MISC SMARTSIG:1 Topical     cyanocobalamin  (,VITAMIN B-12,) 1000 MCG/ML injection Inject 1,000 mcg into the muscle every 30 (thirty) days.     cyclobenzaprine  (FLEXERIL ) 10 MG tablet Take 1 tablet every 6-8 hours by oral route as needed for spasm for 7 days.     DULoxetine (CYMBALTA) 30 MG capsule Take 30 mg by mouth daily.     DULoxetine (CYMBALTA) 60 MG capsule Take 60 mg by mouth daily.     glucose blood (CONTOUR NEXT TEST) test strip      HUMULIN R U-500 KWIKPEN 500 UNIT/ML KwikPen Inject into the skin.     hydrochlorothiazide (HYDRODIURIL) 25 MG tablet 1 tablet in the morning Orally Once a day; Duration: 30 days     hydrOXYzine (ATARAX) 10 MG tablet hydroxyzine HCl 10 mg tablet  TAKE ONE TABLET BY MOUTH EVERY 8 HOURS AS NEEDED FOR ANXIETY     ibuprofen (ADVIL) 800 MG tablet as needed.      Insulin Pen Needle (BD PEN NEEDLE NANO 2ND GEN) 32G X 4 MM MISC Twice a day . Once a day; Duration: 90 days     lisinopril (PRINIVIL,ZESTRIL) 20 MG tablet Take 20 mg by mouth at bedtime.     LYRICA 75 MG capsule Take 1 capsule 3 times a day by oral route for 30 days.     metFORMIN (GLUCOPHAGE-XR) 500 MG 24 hr tablet 2 tablets Orally Once a day for 90 days     nitroGLYCERIN  (NITRODUR - DOSED IN MG/24 HR) 0.2 mg/hr patch Apply 0.5 patches every day by transdermal route as directed for 90 days.     oxyCODONE -acetaminophen  (PERCOCET/ROXICET) 5-325 MG tablet 1-2 tablets, by mouth, every 4-6 hours as needed for pain. NOT TO EXCEED 6 tablets a day.     pantoprazole  (PROTONIX ) 40 MG  tablet Take 1 tablet (40 mg total) by mouth 2 (two) times daily. (Patient taking differently: Take 40 mg by mouth daily.) 60 tablet 11    SYRINGE-NEEDLE, DISP, 3 ML (B-D 3CC LUER-LOK SYR 25GX5/8) 25G X 5/8 3 ML MISC BD Luer-Lok Syringe 3 mL 25 x 5/8  USE TO INJECT VITAMIN B12 ONCE A MONTH.     traMADol (ULTRAM) 50 MG tablet Take 1 tablet every 6 hours by oral route as needed, for moderate to severe pain.     trolamine salicylate (ASPERCREME) 10 % cream Apply 1 Application topically as needed for muscle pain.     Vibegron  75 MG TABS Take 1 tablet (75 mg total) by mouth daily. 30 tablet 5   Current Facility-Administered Medications  Medication Dose Route Frequency Provider Last Rate Last Admin   0.9 %  sodium chloride  infusion  500 mL Intravenous Continuous Pyrtle, Gordy HERO, MD        Review of Systems  Constitutional:  Constitutional negative. HENT: HENT negative.  Respiratory: Respiratory negative.  Cardiovascular: Positive for claudication.  GI: Gastrointestinal negative.  Musculoskeletal: Positive for back pain and leg pain.  Neurological: Neurological negative. Psychiatric: Psychiatric negative.        Objective:  Objective   Vitals:   06/13/24 1411  BP: (!) 144/74  Pulse: 82  Temp: 98.2 F (36.8 C)  SpO2: 95%  Weight: 227 lb (103 kg)  Height: 5' 5 (1.651 m)   Body mass index is 37.77 kg/m.  Physical Exam HENT:     Head: Normocephalic.     Nose: Nose normal.  Eyes:     Pupils: Pupils are equal, round, and reactive to light.  Cardiovascular:     Pulses:          Radial pulses are 2+ on the right side and 2+ on the left side.       Femoral pulses are 1+ on the right side and 2+ on the left side. Abdominal:     General: Abdomen is flat.     Palpations: Abdomen is soft.  Musculoskeletal:     Right lower leg: No edema.     Left lower leg: Edema present.  Skin:    General: Skin is warm.  Neurological:     General: No focal deficit present.     Mental Status: She is alert.     Data: CT ANGIOGRAPHY OF ABDOMINAL AORTA WITH ILIOFEMORAL RUNOFF   TECHNIQUE: Multidetector CT imaging of the  abdomen, pelvis and lower extremities was performed using the standard protocol during bolus administration of intravenous contrast. Multiplanar CT image reconstructions and MIPs were obtained to evaluate the vascular anatomy.   RADIATION DOSE REDUCTION: This exam was performed according to the departmental dose-optimization program which includes automated exposure control, adjustment of the mA and/or kV according to patient size and/or use of iterative reconstruction technique.   CONTRAST:  OMNIPAQUE  IOHEXOL  350 MG/ML SOLN   COMPARISON:  None Available.   FINDINGS: VASCULAR   Aorta: Mild diffuse atherosclerotic changes are present throughout the abdominal aorta. In the infrarenal segment, the lumen is small with minimal luminal diameter estimated at 5 mm.   Celiac: Patent.   SMA: Patent.   Renals: Patent.   IMA: Patent.   RIGHT Lower Extremity   Inflow: Right common iliac artery demonstrates mild diffuse atherosclerotic changes with minimal luminal diameter estimated at 4.5 mm. The right internal iliac artery is patent. The right external iliac artery is patent.   Outflow: Patent  femoral and popliteal segments   Runoff: Patent three-vessel tibial runoff.   LEFT Lower Extremity   Inflow: Mild atherosclerotic changes in the left common iliac artery with minimal luminal diameter estimated at 6 mm. Moderate disease is present in the proximal left internal iliac artery. The left external iliac artery is patent.   Outflow: Patent femoral and popliteal segments.   Runoff: The posterior tibial artery is not significantly opacified. There is peroneal continuation of the PT distally. The anterior tibial artery is patent.   Veins: No obvious venous abnormality within the limitations of this arterial phase study.   Review of the MIP images confirms the above findings.   NON-VASCULAR   Lower chest: Mosaic attenuation of the lungs. The previously observed right  upper lobe pulmonary nodules are partially captured.   Hepatobiliary: No focal liver abnormality is seen. Status post cholecystectomy. No biliary dilatation.   Pancreas: Unremarkable. No pancreatic ductal dilatation or surrounding inflammatory changes.   Spleen: Normal in size without focal abnormality.   Adrenals/Urinary Tract: Adrenal glands are unremarkable. Kidneys are normal, without renal calculi, focal lesion, or hydronephrosis. Bladder is unremarkable.   Stomach/Bowel: Stomach is nondilated. Small hiatal hernia. No dilated loops of small bowel are appreciated. A moderate volume of stool material is present in the colon and there are short segments of mild gaseous distension.   Lymphatic: No evidence of significant lymphadenopathy.   Reproductive: Status post hysterectomy. No adnexal masses.   Other: Edema is present in the lower extremities bilaterally.   Musculoskeletal: Degenerative changes are present in the imaged osseous structures.   IMPRESSION: 1. Mild diffuse atherosclerotic changes with small lumen in the infrarenal segment (minimal luminal diameter estimated at 5 mm). 2. Right lower extremity: Mild inflow disease. Patent femoral, popliteal, and tibial segments. 3. Left lower extremity: Mild inflow disease. Patent femoral and popliteal segments. Two vessel runoff via the anterior tibial artery and peroneal arteries.     Assessment/Plan:     69 year old female with pain as noted above which is likely multifactorial in nature.  She does have mildly decreased ABIs bilaterally however on evaluation with CTA appears that she has diffuse atherosclerotic changes with a decreased diameter of the infrarenal aorta.  Given that she does have flow in the bilateral common femoral arteries I would not get fixing the infrarenal aorta area of stenosis would markedly change her symptoms.  As such we have discussed control of her blood sugar, weight loss and continued walking  with protection of her feet given that her ABIs are decreased.  She can wear a gentle compression stocking on the left given that she has edema on that side.  I will have her follow-up in 1 year with aortoiliac duplex and ABIs.     Penne Lonni Colorado MD [facility]      [1]  Allergies Allergen Reactions   Celecoxib  Other (See Comments)   Exenatide     Other reaction(s): knots at injection sites for months   Percocet [Oxycodone -Acetaminophen ] Itching    PT CAN TAKE WITH BENADRYL   Acetaminophen  Rash    Other reaction(s): Other (See Comments)   Hydrocodone  Itching    PT WOULD TAKE WITH BENADRYL PT WOULD TAKE WITH BENADRYL   Oxycodone  Hcl Rash   Oxycodone -Acetaminophen  Itching    PT CAN TAKE WITH BENADRYL   "

## 2024-06-26 ENCOUNTER — Inpatient Hospital Stay: Attending: Physician Assistant

## 2024-06-26 ENCOUNTER — Ambulatory Visit: Payer: Self-pay | Admitting: Physician Assistant

## 2024-06-26 ENCOUNTER — Other Ambulatory Visit: Payer: Self-pay | Admitting: Physician Assistant

## 2024-06-26 DIAGNOSIS — R748 Abnormal levels of other serum enzymes: Secondary | ICD-10-CM | POA: Diagnosis present

## 2024-06-26 DIAGNOSIS — D500 Iron deficiency anemia secondary to blood loss (chronic): Secondary | ICD-10-CM

## 2024-06-26 LAB — HEPATIC FUNCTION PANEL
ALT: 19 U/L (ref 0–44)
AST: 19 U/L (ref 15–41)
Albumin: 3.8 g/dL (ref 3.5–5.0)
Alkaline Phosphatase: 111 U/L (ref 38–126)
Bilirubin, Direct: <0.1 mg/dL (ref 0.0–0.2)
Total Bilirubin: <0.2 mg/dL (ref 0.0–1.2)
Total Protein: 6.4 g/dL — ABNORMAL LOW (ref 6.5–8.1)

## 2024-06-26 LAB — GAMMA GT: GGT: 28 U/L (ref 7–50)

## 2024-06-26 NOTE — Progress Notes (Signed)
 History of breast cancer as well as osteopenia  Labs from 05/26/2024 showed new elevation in alkaline phosphatase at 148 (prior elevation at 130 in 2021, otherwise alk phos has been normal).   No recent liver imaging on file.  She has been on atorvastatin for several years.  She was able to stop anastrozole  at visit on 05/23/2024.  REPEAT LABS (06/26/2024) shows normal alkaline phosphatase and normal GGT. No additional testing needed at this time.  Will follow CMP at next visit.  Pleasant Erica Barefoot, PA-C  06/26/24 6:08 PM

## 2024-06-26 NOTE — Progress Notes (Signed)
 Patient was seen by me in December, noted to have ferritin <100, but declined IV iron at that time. She is reporting some increased fatigue, and would like to have iron levels rechecked to see if she would benefit from IV iron. Orders for ferritin and iron/TIBC have been entered. Further plan TBD based on lab results.  Pleasant CHRISTELLA Barefoot, PA-C 06/26/24 6:04 PM

## 2024-06-28 ENCOUNTER — Encounter: Attending: Internal Medicine | Admitting: Nutrition

## 2024-06-28 ENCOUNTER — Encounter: Payer: Self-pay | Admitting: Nutrition

## 2024-06-28 VITALS — Ht 65.0 in | Wt 234.0 lb

## 2024-06-28 DIAGNOSIS — E1165 Type 2 diabetes mellitus with hyperglycemia: Secondary | ICD-10-CM | POA: Diagnosis present

## 2024-06-28 DIAGNOSIS — Z713 Dietary counseling and surveillance: Secondary | ICD-10-CM | POA: Insufficient documentation

## 2024-06-28 NOTE — Progress Notes (Signed)
 Diabetes Self-Management Education  Visit Type: First/Initial  Appt. Start Time: 1300 Appt. End Time: 1430  06/28/2024  [name], identified by name and date of birth, is a 69 y.o. female with a diagnosis of Diabetes: Type 2.   ASSESSMENT 70 yr old Wfemale. Referred from Dr. Mae office. PCP Erica Jolee, PA A1C 10.3%. CGM Dexcom shows GMI 8.4%. Avg Glu 215 mg/dl, TIR 66%, 65% high and 31% very high. 2% low blood sugars. Currently taking 130 units U500 before breakfast and 100 units at bedtime. She admits to not being compliant with U500 or diet due to not having knowledge about carbs and how medications work. She is willing to make some changes with diet, proper administration of insulin Only eats 1-2 meals per day and snacks most of the day. Eats a lot of processed and convenient foods. Has a ulcer on her left foot that is being treated and working on getting  it healed Has hip and back issues. Not physically active. Care for her husband who has RA. Height 5' 5 (1.651 m), weight 234 lb (106.1 kg). Body mass index is 38.94 kg/m.   Diabetes Self-Management Education - 06/28/24 1318       Visit Information   Visit Type First/Initial      Initial Visit   Diabetes Type Type 2    Date Diagnosed 2015    Are you currently following a meal plan? No    Are you taking your medications as prescribed? Yes      Health Coping   How would you rate your overall health? Fair      Psychosocial Assessment   Patient Belief/Attitude about Diabetes Defeat/Burnout    What is the hardest part about your diabetes right now, causing you the most concern, or is the most worrisome to you about your diabetes?   Making healty food and beverage choices    Self-care barriers None    Self-management support Friends    Other persons present Patient    Patient Concerns Nutrition/Meal planning;Healthy Lifestyle    Special Needs None    Preferred Learning Style No preference indicated    Learning  Readiness Ready    How often do you need to have someone help you when you read instructions, pamphlets, or other written materials from your doctor or pharmacy? 1 - Never    What is the last grade level you completed in school? 12      Pre-Education Assessment   Patient understands the diabetes disease and treatment process. Needs Instruction    Patient understands incorporating nutritional management into lifestyle. Needs Instruction    Patient undertands incorporating physical activity into lifestyle. Needs Instruction    Patient understands using medications safely. Needs Instruction    Patient understands monitoring blood glucose, interpreting and using results Needs Instruction    Patient understands prevention, detection, and treatment of acute complications. Needs Instruction    Patient understands prevention, detection, and treatment of chronic complications. Needs Instruction    Patient understands how to develop strategies to address psychosocial issues. Needs Instruction    Patient understands how to develop strategies to promote health/change behavior. Needs Instruction      Complications   Last HgB A1C per patient/outside source 10.3 %    How often do you check your blood sugar? 1-2 times/day    Fasting Blood glucose range (mg/dL) 29-870    Postprandial Blood glucose range (mg/dL) >799    Number of hypoglycemic episodes per month 3  Have you had a dilated eye exam in the past 12 months? Yes    Have you had a dental exam in the past 12 months? Yes    Are you checking your feet? Yes    How many days per week are you checking your feet? 7      Dietary Intake   Breakfast 11 am  Gram chocoloate Cookie and coffee    Lunch 3 pm fried chicken, french fries and biscuit,  Sprite    Dinner 10 pm 2 slices of raisin breaad with peanut butter,  Wyers light tea packet sugar free.    Beverage(s) sf free 60 oz,  Regular soda      Activity / Exercise   Activity / Exercise Type ADL's       Patient Education   Previous Diabetes Education No    Disease Pathophysiology Definition of diabetes, type 1 and 2, and the diagnosis of diabetes;Factors that contribute to the development of diabetes;Explored patient's options for treatment of their diabetes    Healthy Eating Reviewed blood glucose goals for pre and post meals and how to evaluate the patients' food intake on their blood glucose level.;Meal timing in regards to the patients' current diabetes medication.;Carbohydrate counting;Plate Method    Being Active Role of exercise on diabetes management, blood pressure control and cardiac health.    Medications Reviewed patients medication for diabetes, action, purpose, timing of dose and side effects.    Monitoring Daily foot exams;Yearly dilated eye exam;Identified appropriate SMBG and/or A1C goals.    Acute complications Discussed and identified patients' prevention, symptoms, and treatment of hyperglycemia.    Chronic complications Assessed and discussed foot care and prevention of foot problems;Relationship between chronic complications and blood glucose control    Diabetes Stress and Support Identified and addressed patients feelings and concerns about diabetes    Lifestyle and Health Coping Lifestyle issues that need to be addressed for better diabetes care      Individualized Goals (developed by patient)   Nutrition Follow meal plan discussed;General guidelines for healthy choices and portions discussed;Carb counting    Physical Activity Exercise 1-2 times per week    Monitoring  Test blood glucose pre and post meals as discussed;Send in my blood glucose log as discussed    Problem Solving Eating Pattern;Medication consistency;Addressing barriers to behavior change    Reducing Risk examine blood glucose patterns    Health Coping Ask for help with psychological, social, or emotional issues;Discuss barriers to diabetes care with support person/system (comment specifics as needed)       Post-Education Assessment   Patient understands the diabetes disease and treatment process. Needs Review    Patient understands incorporating nutritional management into lifestyle. Needs Review    Patient undertands incorporating physical activity into lifestyle. Needs Review    Patient understands using medications safely. Needs Review    Patient understands monitoring blood glucose, interpreting and using results Needs Review    Patient understands prevention, detection, and treatment of acute complications. Needs Review    Patient understands prevention, detection, and treatment of chronic complications. Needs Review    Patient understands how to develop strategies to address psychosocial issues. Needs Review    Patient understands how to develop strategies to promote health/change behavior. Needs Review      Outcomes   Future DMSE 4-6 wks    Program Status Not Completed          Individualized Plan for Diabetes Self-Management Training:   Learning Objective:  Patient will have a greater understanding of diabetes self-management. Patient education plan is to attend individual and/or group sessions per assessed needs and concerns.   Plan:   Eat three meals per day as discussed Drink only water and cut out crystal lights Increase fruits, vegetables and whole grains.  Cut out processed foods and junk food Avoid snacks between meals.and after suppper. Take 130 units of U500 30 minutes before Breakfast and 100 units before dinner daily. Get A1C down to 7%. Expected Outcomes:     Education material provided: A1C conversion sheet, Meal plan card, My Plate, Support group flyer, and Diabetes Resources  If problems or questions, patient to contact team via:  Phone and Email  Future DSME appointment: 4-6 wks

## 2024-06-28 NOTE — Patient Instructions (Signed)
" °  Eat three meals per day as discussed Drink only water and cut out crystal lights Increase fruits, vegetables and whole grains.  Cut out processed foods and junk food Avoid snacks between meals.and after suppper. Take 130 units of U500 30 minutes before Breakfast and 100 units before dinner daily. Get A1C down to 7%. "

## 2024-07-03 ENCOUNTER — Other Ambulatory Visit: Payer: Self-pay | Admitting: *Deleted

## 2024-07-03 DIAGNOSIS — K439 Ventral hernia without obstruction or gangrene: Secondary | ICD-10-CM

## 2024-07-10 ENCOUNTER — Ambulatory Visit: Admitting: Surgery

## 2024-07-10 ENCOUNTER — Encounter: Payer: Self-pay | Admitting: Surgery

## 2024-07-10 VITALS — BP 153/79 | HR 80 | Temp 98.6°F | Resp 18 | Ht 65.0 in | Wt 233.0 lb

## 2024-07-10 DIAGNOSIS — M6208 Separation of muscle (nontraumatic), other site: Secondary | ICD-10-CM | POA: Diagnosis not present

## 2024-08-07 ENCOUNTER — Encounter: Admitting: Nutrition

## 2024-09-25 ENCOUNTER — Other Ambulatory Visit (HOSPITAL_COMMUNITY)

## 2024-09-25 ENCOUNTER — Encounter (HOSPITAL_COMMUNITY)

## 2024-11-21 ENCOUNTER — Inpatient Hospital Stay

## 2024-11-28 ENCOUNTER — Inpatient Hospital Stay: Admitting: Physician Assistant
